# Patient Record
Sex: Male | Born: 1945 | Race: Black or African American | Hispanic: No | Marital: Married | Smoking: Current every day smoker
Health system: Southern US, Community
[De-identification: ages and names within clinical notes are randomized; demographics above are authoritative.]

## PROBLEM LIST (undated history)

## (undated) DIAGNOSIS — R7301 Impaired fasting glucose: Secondary | ICD-10-CM

## (undated) DIAGNOSIS — B1920 Unspecified viral hepatitis C without hepatic coma: Secondary | ICD-10-CM

## (undated) DIAGNOSIS — I2510 Atherosclerotic heart disease of native coronary artery without angina pectoris: Secondary | ICD-10-CM

## (undated) DIAGNOSIS — J449 Chronic obstructive pulmonary disease, unspecified: Secondary | ICD-10-CM

## (undated) DIAGNOSIS — K4090 Unilateral inguinal hernia, without obstruction or gangrene, not specified as recurrent: Principal | ICD-10-CM

## (undated) DIAGNOSIS — G4730 Sleep apnea, unspecified: Secondary | ICD-10-CM

## (undated) DIAGNOSIS — Z87891 Personal history of nicotine dependence: Principal | ICD-10-CM

## (undated) DIAGNOSIS — I10 Essential (primary) hypertension: Secondary | ICD-10-CM

## (undated) HISTORY — DX: Chronic obstructive pulmonary disease, unspecified: J44.9

## (undated) HISTORY — DX: Personal history of nicotine dependence: Z87.891

## (undated) HISTORY — DX: Unspecified viral hepatitis C without hepatic coma: B19.20

## (undated) HISTORY — DX: Sleep apnea, unspecified: G47.30

## (undated) HISTORY — DX: Atherosclerotic heart disease of native coronary artery without angina pectoris: I25.10

## (undated) HISTORY — DX: Essential (primary) hypertension: I10

## (undated) HISTORY — DX: Impaired fasting glucose: R73.01

## (undated) HISTORY — DX: Unilateral inguinal hernia, without obstruction or gangrene, not specified as recurrent: K40.90

---

## 2007-07-20 DIAGNOSIS — I10 Essential (primary) hypertension: Secondary | ICD-10-CM

## 2007-07-20 HISTORY — DX: Essential (primary) hypertension: I10

## 2007-10-09 ENCOUNTER — Ambulatory Visit: Payer: Self-pay | Admitting: Gastroenterology

## 2007-10-09 LAB — HM COLONOSCOPY

## 2008-07-19 HISTORY — PX: COLONOSCOPY: SHX174

## 2012-07-19 DIAGNOSIS — K4090 Unilateral inguinal hernia, without obstruction or gangrene, not specified as recurrent: Secondary | ICD-10-CM

## 2012-07-19 HISTORY — DX: Unilateral inguinal hernia, without obstruction or gangrene, not specified as recurrent: K40.90

## 2012-07-19 HISTORY — PX: HERNIA REPAIR: SHX51

## 2012-10-13 ENCOUNTER — Other Ambulatory Visit: Payer: Self-pay | Admitting: General Surgery

## 2012-10-13 DIAGNOSIS — K4090 Unilateral inguinal hernia, without obstruction or gangrene, not specified as recurrent: Secondary | ICD-10-CM

## 2012-10-16 ENCOUNTER — Ambulatory Visit: Payer: Self-pay | Admitting: General Surgery

## 2012-10-23 ENCOUNTER — Ambulatory Visit: Payer: Self-pay | Admitting: General Surgery

## 2012-10-23 DIAGNOSIS — K4090 Unilateral inguinal hernia, without obstruction or gangrene, not specified as recurrent: Secondary | ICD-10-CM

## 2012-10-24 ENCOUNTER — Encounter: Payer: Self-pay | Admitting: General Surgery

## 2012-10-30 ENCOUNTER — Ambulatory Visit (INDEPENDENT_AMBULATORY_CARE_PROVIDER_SITE_OTHER): Payer: BC Managed Care – PPO | Admitting: General Surgery

## 2012-10-30 ENCOUNTER — Encounter: Payer: Self-pay | Admitting: General Surgery

## 2012-10-30 VITALS — BP 140/76 | HR 72 | Resp 14 | Ht 70.5 in | Wt 213.0 lb

## 2012-10-30 DIAGNOSIS — K4090 Unilateral inguinal hernia, without obstruction or gangrene, not specified as recurrent: Secondary | ICD-10-CM

## 2012-10-30 NOTE — Patient Instructions (Addendum)
The patient can resume normal activity starting next week.

## 2012-10-30 NOTE — Progress Notes (Signed)
Patient ID: Nathan Cooper, male   DOB: 11-May-1946, 67 y.o.   MRN: [id number]  Chief Complaint  Patient presents with  . Routine Post Op    hernia repair    HPI Nathan Cooper is a 67 y.o. male who presents for a post op appointment. The procedure performed was a right inguinal hernia repair done with Progrip Mesh. The date performed was 10/23/12. The patient denies any problems at this time.   HPI  Past Medical History  Diagnosis Date  . Hypertension 2009  . Hernia, inguinal 2014    Past Surgical History  Procedure Laterality Date  . Colonoscopy  2010  . Hernia repair  2014    History reviewed. No pertinent family history.  Social History History  Substance Use Topics  . Smoking status: Current Every Day Smoker -- 1.00 packs/day for 45 years  . Smokeless tobacco: Not on file  . Alcohol Use: No    No Known Allergies  Current Outpatient Prescriptions  Medication Sig Dispense Refill  . amLODipine (NORVASC) 10 MG tablet Take 1 tablet by mouth daily.      . benazepril (LOTENSIN) 20 MG tablet Take 1 tablet by mouth daily.      Marland Kitchen oxyCODONE-acetaminophen (PERCOCET/ROXICET) 5-325 MG per tablet Take 1 tablet by mouth as needed.      Marland Kitchen VIAGRA 100 MG tablet Take 1 tablet by mouth as needed.       No current facility-administered medications for this visit.    Review of Systems Review of Systems  Constitutional: Negative.   Respiratory: Negative.   Cardiovascular: Negative.   Gastrointestinal: Negative.     Blood pressure 140/76, pulse 72, resp. rate 14, height 5' 10.5" (1.791 m), weight 213 lb (96.616 kg).  Physical Exam Physical Exam  Constitutional: He appears well-developed and well-nourished.  Eyes: Conjunctivae are normal. No scleral icterus.  Neck: Trachea normal. No mass and no thyromegaly present.  Cardiovascular: Normal pulses.   No murmur heard. Abdominal: Soft. Normal appearance and bowel sounds are normal. There is no hepatosplenomegaly. There is  no tenderness. No hernia.      Data Reviewed none  Assessment    Doinf well post op right ing hernia repair     Plan    Can start with increased activity from next week        Taleyah Hillman G 10/30/2012, 9:49 AM

## 2012-11-27 ENCOUNTER — Encounter: Payer: Self-pay | Admitting: General Surgery

## 2012-11-27 ENCOUNTER — Ambulatory Visit (INDEPENDENT_AMBULATORY_CARE_PROVIDER_SITE_OTHER): Payer: Medicare Other | Admitting: General Surgery

## 2012-11-27 VITALS — BP 142/68 | HR 76 | Resp 12 | Ht 71.0 in | Wt 213.0 lb

## 2012-11-27 DIAGNOSIS — K4090 Unilateral inguinal hernia, without obstruction or gangrene, not specified as recurrent: Secondary | ICD-10-CM

## 2012-11-27 NOTE — Progress Notes (Signed)
Patient ID: Nathan Cooper, male   DOB: 04/28/1946, 67 y.o.   MRN: [id number] Nathan Cooper is a 67 y.o. male who presents for a post op appointment. The procedure performed was a right inguinal hernia repair done with Progrip Mesh. The date performed was 10/23/12. The patient denies any problems at this time.   Right inguinal hernia repair well healed and no recurrence of hernia noted. Abdomen is sofrt, nontender, good bowel sounds.

## 2012-11-27 NOTE — Patient Instructions (Addendum)
Patient to return as needed. No activity restrictions 

## 2012-11-29 ENCOUNTER — Encounter: Payer: Self-pay | Admitting: General Surgery

## 2013-02-21 ENCOUNTER — Other Ambulatory Visit: Payer: Self-pay

## 2013-05-24 ENCOUNTER — Other Ambulatory Visit: Payer: Self-pay

## 2014-05-27 ENCOUNTER — Ambulatory Visit: Payer: Self-pay | Admitting: Family Medicine

## 2014-06-03 ENCOUNTER — Ambulatory Visit: Payer: Self-pay | Admitting: Gastroenterology

## 2014-09-06 DIAGNOSIS — G4733 Obstructive sleep apnea (adult) (pediatric): Secondary | ICD-10-CM | POA: Diagnosis not present

## 2014-11-05 DIAGNOSIS — B182 Chronic viral hepatitis C: Secondary | ICD-10-CM | POA: Diagnosis not present

## 2014-11-08 NOTE — Op Note (Signed)
PATIENT NAME:  Nathan Cooper, Nathan Cooper MR#:  [id number] DATE OF BIRTH:  July 26, 1945  DATE OF PROCEDURE:  10/23/2012  PREOPERATIVE DIAGNOSIS: Right inguinal hernia.   POSTOPERATIVE DIAGNOSIS: Direct right inguinal hernia with a large lipoma of the cord.   OPERATION: Repair, right inguinal hernia; and excision, lipoma of the cord.   SURGEON: Mckinley Jewel, M.D.   ANESTHESIA: General.   COMPLICATIONS: None.   ESTIMATED BLOOD LOSS: Less than 20 mL.   DRAINS: None.   PROCEDURE: The patient was put to sleep in the supine position on the operating table and LMA was utilized. The right groin was prepped and draped out as a sterile field. Half-percent Marcaine was instilled in the right inguinal region for postop analgesia. The deeper tissues in the course of dissection were also instilled with this, and a total of 30 mL was used all told at the end of the procedure. A skin incision was made along the medial two-thirds of the inguinal canal and deepened through the layers down to the external oblique; bleeding controlled with cautery. The patient had somewhat of a parchment-like feel to his external oblique aponeurosis, also the muscular tissue of the internal oblique fibers being somewhat thickened in nature. The external oblique was opened along the line of its fibers to expose the inguinal canal, and exploration revealed the presence of 2 things: A large lipoma of the cord, and a very large direct hernia. The lipoma was first dissected free all the way down to the internal ring area where it was clamped, cut and ligated with 2-0 Vicryl. No indirect sac was identified, with the cord structures being very thin. The medially-located direct hernia was satisfactorily freed, plicated, and the transversalis fascia was sutured over this with a running suture of 2-0 PDS. The ilioinguinal nerves were identified and preserved going along the line with the cord structures. A Parietex ProGrip mesh was brought up to the  field. The inferior edge was trimmed slightly. It was then placed around the cord and laid down around the internal ring area, smoothed out and positioned inside the external oblique laterally. The medial end was then tacked to the pubic tubercle with a 2-0 PDS stitch. Satisfactory reinforced repair was obtained. The wound was irrigated and closed. External oblique closed with a running 2-0 Vicryl. Subcutaneous tissue was closed with 2-0 Vicryl, and the skin with subcuticular 4-0 Vicryl. Dermabond was applied. The patient tolerated the procedure well. There were no immediate problems. He was extubated and returned to the recovery room in stable condition.     ____________________________ S.Robinette Haines, MD sgs:dm D: 10/23/2012 11:40:00 ET T: 10/23/2012 12:18:06 ET JOB#: [id number]  cc: S.G. Jamal Collin, MD, <Dictator> [facility] Robinette Haines MD ELECTRONICALLY SIGNED 10/23/2012 14:51

## 2014-11-29 DIAGNOSIS — G4733 Obstructive sleep apnea (adult) (pediatric): Secondary | ICD-10-CM | POA: Diagnosis not present

## 2014-12-24 ENCOUNTER — Telehealth: Payer: Self-pay | Admitting: Family Medicine

## 2014-12-24 ENCOUNTER — Telehealth: Payer: Self-pay

## 2014-12-24 MED ORDER — BENAZEPRIL HCL 40 MG PO TABS: 40.0000 mg | ORAL_TABLET | Freq: Every day | ORAL | Status: DC

## 2014-12-24 NOTE — Telephone Encounter (Signed)
Patient needs an appointment, when he makes the it she will give him enough medication. Please notify Dr.Johnson when appointment has been made.

## 2014-12-24 NOTE — Telephone Encounter (Signed)
Pt is scheduled for 12/25/14 (tomorrow) @ 8:30am. Laurie Panda by Ulla Gallo. Thanks.

## 2014-12-24 NOTE — Telephone Encounter (Signed)
Request for a refill on his BP medication. I checked [facility] he was last seen 05/16/14 for HTN.

## 2014-12-24 NOTE — Telephone Encounter (Signed)
Pt called stated [facility] is calling him about refill requests for his medications. The following med need to be refilled: Amlodopine. [facility] is [facility] [place]. Thanks.

## 2014-12-25 ENCOUNTER — Ambulatory Visit (INDEPENDENT_AMBULATORY_CARE_PROVIDER_SITE_OTHER): Payer: Medicare Other | Admitting: Family Medicine

## 2014-12-25 ENCOUNTER — Encounter: Payer: Self-pay | Admitting: Family Medicine

## 2014-12-25 VITALS — BP 138/84 | HR 89 | Temp 98.2°F | Ht 70.3 in | Wt 225.0 lb

## 2014-12-25 DIAGNOSIS — Z125 Encounter for screening for malignant neoplasm of prostate: Secondary | ICD-10-CM | POA: Diagnosis not present

## 2014-12-25 DIAGNOSIS — B182 Chronic viral hepatitis C: Secondary | ICD-10-CM

## 2014-12-25 DIAGNOSIS — I10 Essential (primary) hypertension: Secondary | ICD-10-CM | POA: Diagnosis not present

## 2014-12-25 DIAGNOSIS — Z13220 Encounter for screening for lipoid disorders: Secondary | ICD-10-CM | POA: Diagnosis not present

## 2014-12-25 DIAGNOSIS — E669 Obesity, unspecified: Secondary | ICD-10-CM

## 2014-12-25 MED ORDER — SILDENAFIL CITRATE 100 MG PO TABS: 100.0000 mg | ORAL_TABLET | ORAL | Status: DC | PRN

## 2014-12-25 MED ORDER — BENAZEPRIL HCL 40 MG PO TABS: 40.0000 mg | ORAL_TABLET | Freq: Every day | ORAL | Status: DC

## 2014-12-25 MED ORDER — AMLODIPINE BESYLATE 10 MG PO TABS: 10.0000 mg | ORAL_TABLET | Freq: Every day | ORAL | Status: DC

## 2014-12-25 NOTE — Assessment & Plan Note (Signed)
Slightly elevated today, but still under good control. Refills of his medications sent to his pharmacy. Will check back in 6 months for CPE. BMP checked today. Await results.

## 2014-12-25 NOTE — Assessment & Plan Note (Signed)
Continue to follow with GI. Will obtain records from GI to see if he has had Hep A and B vaccines, if not he will start the series here. Will get him pneumovax at his next appointment.

## 2014-12-25 NOTE — Progress Notes (Signed)
BP 138/84 mmHg  Pulse 89  Temp(Src) 98.2 F (36.8 C)  Ht 5' 10.3" (1.786 m)  Wt 225 lb (102.059 kg)  BMI 32.00 kg/m2  SpO2 97%   Subjective:    Patient ID: Nathan Cooper, male    DOB: [birth year], 69 y.o.   MRN: [id number]  HPI: Nathan Cooper is a 69 y.o. male presenting on 12/25/2014 for Hypertension He has been doing very well, has been going through [facility] treatment with GI. Feeling well. Has run out of amlodipine. Otherwise feeling well with no other concerns or complaints at this time.   HYPERTENSION Hypertension status: exacerbated has been out of the amlodipine for about a week Satisfied with current treatment? yes Duration of hypertension: chronic BP monitoring frequency:  not checking BP medication side effects:  no Medication compliance: excellent compliance Aspirin: no Recurrent headaches: no Visual changes: no Palpitations: no Dyspnea: yes- occasionally Chest pain: no Lower extremity edema: no Dizzy/lightheaded: no  HEPATITIS C Duration since diagnosis: Dx'd in November- has been seeing GI, treated with Harvoni, last check was negative viral load, to be rechecked in 1 year.  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  Relevant past medical, surgical, family and social history reviewed and updated as indicated. Interim medical history since our last visit reviewed. Allergies and medications reviewed and updated.  No current outpatient prescriptions on file prior to visit.   No current facility-administered medications on file prior to visit.    Review of Systems  Constitutional: Negative.    Respiratory: Negative.   Cardiovascular: Negative.   Gastrointestinal: Negative.   Psychiatric/Behavioral: Negative.     Per HPI unless specifically indicated above     Objective:    BP 138/84 mmHg  Pulse 89  Temp(Src) 98.2 F (36.8 C)  Ht 5' 10.3" (1.786 m)  Wt 225 lb (102.059 kg)  BMI 32.00 kg/m2  SpO2 97%  Wt Readings from Last 3 Encounters:  12/25/14 225 lb (102.059 kg)  11/27/12 213 lb (96.616 kg)  10/30/12 213 lb (96.616 kg)    Physical Exam  Constitutional: He is oriented to person, place, and time. He appears well-developed and well-nourished.  HENT:  Head: Normocephalic and atraumatic.  Eyes: Conjunctivae and EOM are normal. Pupils are equal, round, and reactive to light.  Neck: Normal range of motion. Neck supple.  Cardiovascular: Normal rate, regular rhythm, normal heart sounds and intact distal pulses.  Exam reveals no gallop and no friction rub.   No murmur heard. Pulmonary/Chest: Effort normal and breath sounds normal.  Neurological: He is alert and oriented to person, place, and time.  Skin: Skin is warm and dry.  Psychiatric: He has a normal mood and affect. His behavior is normal. Judgment and thought content normal.    No results found for this or any previous visit.    Assessment & Plan:   Problem List Items Addressed This Visit    HTN (hypertension) - Primary    Slightly elevated today, but still under good control. Refills of his medications sent to his pharmacy. Will check back in 6 months for CPE. BMP checked today. Await results.       Relevant Medications   sildenafil (VIAGRA) 100  MG tablet   amLODipine (NORVASC) 10 MG tablet   benazepril (LOTENSIN) 40 MG tablet   Other Relevant Orders   Basic Metabolic Panel (BMET)   Hep C w/o coma, chronic    Continue to follow with GI. Will obtain records from GI to see if he has had Hep A and B vaccines, if not he will start the series here. Will get him pneumovax at his next appointment.        Relevant Medications   valACYclovir (VALTREX) 500 MG tablet       Follow up plan: Return in about 6 months (around 06/26/2015) for CPE.

## 2014-12-25 NOTE — Patient Instructions (Signed)
DASH Eating Plan °DASH stands for "Dietary Approaches to Stop Hypertension." The DASH eating plan is a healthy eating plan that has been shown to reduce high blood pressure (hypertension). Additional health benefits may include reducing the risk of type 2 diabetes mellitus, heart disease, and stroke. The DASH eating plan may also help with weight loss. °WHAT DO I NEED TO KNOW ABOUT THE DASH EATING PLAN? °For the DASH eating plan, you will follow these general guidelines: °· Choose foods with a percent daily value for sodium of less than 5% (as listed on the food label). °· Use salt-free seasonings or herbs instead of table salt or sea salt. °· Check with your health care provider or pharmacist before using salt substitutes. °· Eat lower-sodium products, often labeled as "lower sodium" or "no salt added." °· Eat fresh foods. °· Eat more vegetables, fruits, and low-fat dairy products. °· Choose whole grains. Look for the word "whole" as the first word in the ingredient list. °· Choose fish and skinless chicken or turkey more often than red meat. Limit fish, poultry, and meat to 6 oz (170 g) each day. °· Limit sweets, desserts, sugars, and sugary drinks. °· Choose heart-healthy fats. °· Limit cheese to 1 oz (28 g) per day. °· Eat more home-cooked food and less restaurant, buffet, and fast food. °· Limit fried foods. °· Cook foods using methods other than frying. °· Limit canned vegetables. If you do use them, rinse them well to decrease the sodium. °· When eating at a restaurant, ask that your food be prepared with less salt, or no salt if possible. °WHAT FOODS CAN I EAT? °Seek help from a dietitian for individual calorie needs. °Grains °Whole grain or whole wheat bread. Brown rice. Whole grain or whole wheat pasta. Quinoa, bulgur, and whole grain cereals. Low-sodium cereals. Corn or whole wheat flour tortillas. Whole grain cornbread. Whole grain crackers. Low-sodium crackers. °Vegetables °Fresh or frozen vegetables  (raw, steamed, roasted, or grilled). Low-sodium or reduced-sodium tomato and vegetable juices. Low-sodium or reduced-sodium tomato sauce and paste. Low-sodium or reduced-sodium canned vegetables.  °Fruits °All fresh, canned (in natural juice), or frozen fruits. °Meat and Other Protein Products °Ground beef (85% or leaner), grass-fed beef, or beef trimmed of fat. Skinless chicken or turkey. Ground chicken or turkey. Pork trimmed of fat. All fish and seafood. Eggs. Dried beans, peas, or lentils. Unsalted nuts and seeds. Unsalted canned beans. °Dairy °Low-fat dairy products, such as skim or 1% milk, 2% or reduced-fat cheeses, low-fat ricotta or cottage cheese, or plain low-fat yogurt. Low-sodium or reduced-sodium cheeses. °Fats and Oils °Tub margarines without trans fats. Light or reduced-fat mayonnaise and salad dressings (reduced sodium). Avocado. Safflower, olive, or canola oils. Natural peanut or almond butter. °Other °Unsalted popcorn and pretzels. °The items listed above may not be a complete list of recommended foods or beverages. Contact your dietitian for more options. °WHAT FOODS ARE NOT RECOMMENDED? °Grains °White bread. White pasta. White rice. Refined cornbread. Bagels and croissants. Crackers that contain trans fat. °Vegetables °Creamed or fried vegetables. Vegetables in a cheese sauce. Regular canned vegetables. Regular canned tomato sauce and paste. Regular tomato and vegetable juices. °Fruits °Dried fruits. Canned fruit in light or heavy syrup. Fruit juice. °Meat and Other Protein Products °Fatty cuts of meat. Ribs, chicken wings, bacon, sausage, bologna, salami, chitterlings, fatback, hot dogs, bratwurst, and packaged luncheon meats. Salted nuts and seeds. Canned beans with salt. °Dairy °Whole or 2% milk, cream, half-and-half, and cream cheese. Whole-fat or sweetened yogurt. Full-fat   cheeses or blue cheese. Nondairy creamers and whipped toppings. Processed cheese, cheese spreads, or cheese  curds. °Condiments °Onion and garlic salt, seasoned salt, table salt, and sea salt. Canned and packaged gravies. Worcestershire sauce. Tartar sauce. Barbecue sauce. Teriyaki sauce. Soy sauce, including reduced sodium. Steak sauce. Fish sauce. Oyster sauce. Cocktail sauce. Horseradish. Ketchup and mustard. Meat flavorings and tenderizers. Bouillon cubes. Hot sauce. Tabasco sauce. Marinades. Taco seasonings. Relishes. °Fats and Oils °Butter, stick margarine, lard, shortening, ghee, and bacon fat. Coconut, palm kernel, or palm oils. Regular salad dressings. °Other °Pickles and olives. Salted popcorn and pretzels. °The items listed above may not be a complete list of foods and beverages to avoid. Contact your dietitian for more information. °WHERE CAN I FIND MORE INFORMATION? °National [employer], [employer], and [employer] Institute: www.nhlbi.nih.gov/health/health-topics/topics/dash/ °Document Released: 06/24/2011 Document Revised: 11/19/2013 Document Reviewed: 05/09/2013 °[employer]® Patient Information ©2015 [employer]. This information is not intended to replace advice given to you by your health care provider. Make sure you discuss any questions you have with your health care provider. ° °

## 2014-12-26 ENCOUNTER — Encounter: Payer: Self-pay | Admitting: Family Medicine

## 2014-12-26 LAB — BASIC METABOLIC PANEL
BUN / CREAT RATIO: 10 (ref 10–22)
BUN: 12 mg/dL (ref 8–27)
CALCIUM: 8.9 mg/dL (ref 8.6–10.2)
CO2: 21 mmol/L (ref 18–29)
CREATININE: 1.23 mg/dL (ref 0.76–1.27)
Chloride: 104 mmol/L (ref 97–108)
GFR calc Af Amer: 70 mL/min/{1.73_m2} (ref >59)
GFR, EST NON AFRICAN AMERICAN: 60 mL/min/{1.73_m2} (ref >59)
Glucose: 92 mg/dL (ref 65–99)
POTASSIUM: 4.1 mmol/L (ref 3.5–5.2)
Sodium: 140 mmol/L (ref 134–144)

## 2015-06-25 ENCOUNTER — Other Ambulatory Visit: Payer: Self-pay

## 2015-06-25 ENCOUNTER — Telehealth: Payer: Self-pay

## 2015-06-25 DIAGNOSIS — B182 Chronic viral hepatitis C: Secondary | ICD-10-CM

## 2015-06-25 NOTE — Telephone Encounter (Signed)
Pt notified he is due for repeat labs. Lab order mailed to pt's home.

## 2015-06-25 NOTE — Telephone Encounter (Signed)
-----   Message from Otilio Jefferson sent at 01/06/2015  2:32 PM EDT ----- Regarding: Re: Result Follow-up Sent: 05/20/2015  Phone Encounter: 11/04/2014  Pt notified of results. Call pt to remind of 52months blood work. GF  > From: Reynaldo Rossman > To: Aziza Stuckert > Sent: 11/13/2014 5:05 PM > LVM for pt to return my call. GF  > From: Lucilla Lame MD > To: Kenitra Leventhal > Sent: 11/11/2014 10:30 AM > Let pt know HCV is negative and LFT's are normal. make sure patient is tested at approproate times post treatment.

## 2015-06-30 ENCOUNTER — Encounter: Payer: Medicare Other | Admitting: Family Medicine

## 2015-07-03 DIAGNOSIS — B182 Chronic viral hepatitis C: Secondary | ICD-10-CM | POA: Diagnosis not present

## 2015-07-04 LAB — HEPATIC FUNCTION PANEL
ALBUMIN: 4.8 g/dL (ref 3.6–4.8)
ALK PHOS: 68 IU/L (ref 39–117)
ALT: 12 IU/L (ref 0–44)
AST: 17 IU/L (ref 0–40)
BILIRUBIN TOTAL: 0.3 mg/dL (ref 0.0–1.2)
BILIRUBIN, DIRECT: 0.1 mg/dL (ref 0.00–0.40)
Total Protein: 7.7 g/dL (ref 6.0–8.5)

## 2015-07-17 ENCOUNTER — Telehealth: Payer: Self-pay

## 2015-07-17 ENCOUNTER — Ambulatory Visit (INDEPENDENT_AMBULATORY_CARE_PROVIDER_SITE_OTHER): Payer: Medicare Other | Admitting: Family Medicine

## 2015-07-17 ENCOUNTER — Encounter: Payer: Self-pay | Admitting: Family Medicine

## 2015-07-17 VITALS — BP 149/70 | HR 103 | Temp 98.9°F | Ht 69.4 in | Wt 221.0 lb

## 2015-07-17 DIAGNOSIS — B182 Chronic viral hepatitis C: Secondary | ICD-10-CM

## 2015-07-17 DIAGNOSIS — S81802A Unspecified open wound, left lower leg, initial encounter: Secondary | ICD-10-CM | POA: Diagnosis not present

## 2015-07-17 DIAGNOSIS — Z125 Encounter for screening for malignant neoplasm of prostate: Secondary | ICD-10-CM

## 2015-07-17 DIAGNOSIS — Z0000 Encounter for general adult medical examination without abnormal findings: Secondary | ICD-10-CM | POA: Diagnosis not present

## 2015-07-17 DIAGNOSIS — E669 Obesity, unspecified: Secondary | ICD-10-CM | POA: Diagnosis not present

## 2015-07-17 DIAGNOSIS — R739 Hyperglycemia, unspecified: Secondary | ICD-10-CM | POA: Diagnosis not present

## 2015-07-17 DIAGNOSIS — Z13220 Encounter for screening for lipoid disorders: Secondary | ICD-10-CM

## 2015-07-17 DIAGNOSIS — I10 Essential (primary) hypertension: Secondary | ICD-10-CM | POA: Diagnosis not present

## 2015-07-17 DIAGNOSIS — Z7689 Persons encountering health services in other specified circumstances: Secondary | ICD-10-CM | POA: Diagnosis not present

## 2015-07-17 DIAGNOSIS — Z720 Tobacco use: Secondary | ICD-10-CM | POA: Diagnosis not present

## 2015-07-17 DIAGNOSIS — Z23 Encounter for immunization: Secondary | ICD-10-CM

## 2015-07-17 LAB — UA/M W/RFLX CULTURE, ROUTINE
Bilirubin, UA: NEGATIVE
Glucose, UA: NEGATIVE
LEUKOCYTES UA: NEGATIVE
Nitrite, UA: NEGATIVE
PH UA: 5.5 (ref 5.0–7.5)
PROTEIN UA: NEGATIVE
RBC, UA: NEGATIVE
Specific Gravity, UA: >1.03 — ABNORMAL HIGH (ref 1.005–1.030)
Urobilinogen, Ur: 1 mg/dL (ref 0.2–1.0)

## 2015-07-17 LAB — HCV RT-PCR, QUANT (NON-GRAPH)

## 2015-07-17 MED ORDER — VARENICLINE TARTRATE 1 MG PO TABS: 1.0000 mg | ORAL_TABLET | Freq: Two times a day (BID) | ORAL | Status: DC

## 2015-07-17 MED ORDER — VARENICLINE TARTRATE 0.5 MG X 11 & 1 MG X 42 PO MISC: ORAL | Status: DC

## 2015-07-17 MED ORDER — AMLODIPINE BESYLATE 10 MG PO TABS: 10.0000 mg | ORAL_TABLET | Freq: Every day | ORAL | Status: DC

## 2015-07-17 MED ORDER — BENAZEPRIL HCL 40 MG PO TABS: 40.0000 mg | ORAL_TABLET | Freq: Every day | ORAL | Status: DC

## 2015-07-17 NOTE — Patient Instructions (Addendum)
Preventative Services:  Health Risk Assessment and Personalized Prevention Plan: See below CVD Screening: Done Today Colon Cancer Screening: Not due until 2019 Depression Screening: Today Diabetes Screening: Done today Glaucoma Screening: Doesn't see one, will consider Hepatitis B vaccine: Given today, return in 1 month for 2nd shot Hepatitis C screening: Done  Flu Vaccine: Done today Lung cancer Screening: Ordered today Obesity Screening: Done today Pneumonia Vaccines (2): Done today STI Screening: N/A PSA screening: done today Health Maintenance, Male A healthy lifestyle and preventative care can promote health and wellness.  Maintain regular health, dental, and eye exams.  Eat a healthy diet. Foods like vegetables, fruits, whole grains, low-fat dairy products, and lean protein foods contain the nutrients you need and are low in calories. Decrease your intake of foods high in solid fats, added sugars, and salt. Get information about a proper diet from your health care provider, if necessary.  Regular physical exercise is one of the most important things you can do for your health. Most adults should get at least 150 minutes of moderate-intensity exercise (any activity that increases your heart rate and causes you to sweat) each week. In addition, most adults need muscle-strengthening exercises on 2 or more days a week.   Maintain a healthy weight. The body mass index (BMI) is a screening tool to identify possible weight problems. It provides an estimate of body fat based on height and weight. Your health care provider can find your BMI and can help you achieve or maintain a healthy weight. For males 20 years and older:  A BMI below 18.5 is considered underweight.  A BMI of 18.5 to 24.9 is normal.  A BMI of 25 to 29.9 is considered overweight.  A BMI of 30 and above is considered obese.  Maintain normal blood lipids and cholesterol by exercising and minimizing your intake of  saturated fat. Eat a balanced diet with plenty of fruits and vegetables. Blood tests for lipids and cholesterol should begin at age 6 and be repeated every 5 years. If your lipid or cholesterol levels are high, you are over age 74, or you are at high risk for heart disease, you may need your cholesterol levels checked more frequently.Ongoing high lipid and cholesterol levels should be treated with medicines if diet and exercise are not working.  If you smoke, find out from your health care provider how to quit. If you do not use tobacco, do not start.  Lung cancer screening is recommended for adults aged 46-80 years who are at high risk for developing lung cancer because of a history of smoking. A yearly low-dose CT scan of the lungs is recommended for people who have at least a 30-pack-year history of smoking and are current smokers or have quit within the past 15 years. A pack year of smoking is smoking an average of 1 pack of cigarettes a day for 1 year (for example, a 30-pack-year history of smoking could mean smoking 1 pack a day for 30 years or 2 packs a day for 15 years). Yearly screening should continue until the smoker has stopped smoking for at least 15 years. Yearly screening should be stopped for people who develop a health problem that would prevent them from having lung cancer treatment.  If you choose to drink alcohol, do not have more than 2 drinks per day. One drink is considered to be 12 oz (360 mL) of beer, 5 oz (150 mL) of wine, or 1.5 oz (45 mL) of liquor.  Avoid the use of street drugs. Do not share needles with anyone. Ask for help if you need support or instructions about stopping the use of drugs.  High blood pressure causes heart disease and increases the risk of stroke. High blood pressure is more likely to develop in:  People who have blood pressure in the end of the normal range (100-139/85-89 mm Hg).  People who are overweight or obese.  People who are African  American.  If you are 60-65 years of age, have your blood pressure checked every 3-5 years. If you are 75 years of age or older, have your blood pressure checked every year. You should have your blood pressure measured twice--once when you are at a hospital or clinic, and once when you are not at a hospital or clinic. Record the average of the two measurements. To check your blood pressure when you are not at a hospital or clinic, you can use:  An automated blood pressure machine at a pharmacy.  A home blood pressure monitor.  If you are 68-68 years old, ask your health care provider if you should take aspirin to prevent heart disease.  Diabetes screening involves taking a blood sample to check your fasting blood sugar level. This should be done once every 3 years after age 22 if you are at a normal weight and without risk factors for diabetes. Testing should be considered at a younger age or be carried out more frequently if you are overweight and have at least 1 risk factor for diabetes.  Colorectal cancer can be detected and often prevented. Most routine colorectal cancer screening begins at the age of 44 and continues through age 12. However, your health care provider may recommend screening at an earlier age if you have risk factors for colon cancer. On a yearly basis, your health care provider may provide home test kits to check for hidden blood in the stool. A small camera at the end of a tube may be used to directly examine the colon (sigmoidoscopy or colonoscopy) to detect the earliest forms of colorectal cancer. Talk to your health care provider about this at age 11 when routine screening begins. A direct exam of the colon should be repeated every 5-10 years through age 18, unless early forms of precancerous polyps or small growths are found.  People who are at an increased risk for hepatitis B should be screened for this virus. You are considered at high risk for hepatitis B if:  You were  born in a country where hepatitis B occurs often. Talk with your health care provider about which countries are considered high risk.  Your parents were born in a high-risk country and you have not received a shot to protect against hepatitis B (hepatitis B vaccine).  You have HIV or AIDS.  You use needles to inject street drugs.  You live with, or have sex with, someone who has hepatitis B.  You are a man who has sex with other men (MSM).  You get hemodialysis treatment.  You take certain medicines for conditions like cancer, organ transplantation, and autoimmune conditions.  Hepatitis C blood testing is recommended for all people born from [birth year] through 1963 and any individual with known risk factors for hepatitis C.  Healthy men should no longer receive prostate-specific antigen (PSA) blood tests as part of routine cancer screening. Talk to your health care provider about prostate cancer screening.  Testicular cancer screening is not recommended for adolescents or adult males who  have no symptoms. Screening includes self-exam, a health care provider exam, and other screening tests. Consult with your health care provider about any symptoms you have or any concerns you have about testicular cancer.  Practice safe sex. Use condoms and avoid high-risk sexual practices to reduce the spread of sexually transmitted infections (STIs).  You should be screened for STIs, including gonorrhea and chlamydia if:  You are sexually active and are younger than 24 years.  You are older than 24 years, and your health care provider tells you that you are at risk for this type of infection.  Your sexual activity has changed since you were last screened, and you are at an increased risk for chlamydia or gonorrhea. Ask your health care provider if you are at risk.  If you are at risk of being infected with HIV, it is recommended that you take a prescription medicine daily to prevent HIV infection. This is  called pre-exposure prophylaxis (PrEP). You are considered at risk if:  You are a man who has sex with other men (MSM).  You are a heterosexual man who is sexually active with multiple partners.  You take drugs by injection.  You are sexually active with a partner who has HIV.  Talk with your health care provider about whether you are at high risk of being infected with HIV. If you choose to begin PrEP, you should first be tested for HIV. You should then be tested every 3 months for as long as you are taking PrEP.  Use sunscreen. Apply sunscreen liberally and repeatedly throughout the day. You should seek shade when your shadow is shorter than you. Protect yourself by wearing long sleeves, pants, a wide-brimmed hat, and sunglasses year round whenever you are outdoors.  Tell your health care provider of new moles or changes in moles, especially if there is a change in shape or color. Also, tell your health care provider if a mole is larger than the size of a pencil eraser.  A one-time screening for abdominal aortic aneurysm (AAA) and surgical repair of large AAAs by ultrasound is recommended for men aged 27-75 years who are current or former smokers.  Stay current with your vaccines (immunizations).   This information is not intended to replace advice given to you by your health care provider. Make sure you discuss any questions you have with your health care provider.   Document Released: 01/01/2008 Document Revised: 07/26/2014 Document Reviewed: 11/30/2010 Elsevier Interactive Patient Education 2016 [place] Can Quit Smoking If you are ready to quit smoking or are thinking about it, congratulations! You have chosen to help yourself be healthier and live longer! There are lots of different ways to quit smoking. Nicotine gum, nicotine patches, a nicotine inhaler, or nicotine nasal spray can help with physical craving. Hypnosis, support groups, and medicines help break the habit of  smoking. TIPS TO GET OFF AND STAY OFF CIGARETTES  Learn to predict your moods. Do not let a bad situation be your excuse to have a cigarette. Some situations in your life might tempt you to have a cigarette.  Ask friends and co-workers not to smoke around you.  Make your home smoke-free.  Never have "just one" cigarette. It leads to wanting another and another. Remind yourself of your decision to quit.  On a card, make a list of your reasons for not smoking. Read it at least the same number of times a day as you have a cigarette. Tell yourself everyday, "I  do not want to smoke. I choose not to smoke."  Ask someone at home or work to help you with your plan to quit smoking.  Have something planned after you eat or have a cup of coffee. Take a walk or get other exercise to perk you up. This will help to keep you from overeating.  Try a relaxation exercise to calm you down and decrease your stress. Remember, you may be tense and nervous the first two weeks after you quit. This will pass.  Find new activities to keep your hands busy. Play with a pen, coin, or rubber band. Doodle or draw things on paper.  Brush your teeth right after eating. This will help cut down the craving for the taste of tobacco after meals. You can try mouthwash too.  Try gum, breath mints, or diet candy to keep something in your mouth. IF YOU SMOKE AND WANT TO QUIT:  Do not stock up on cigarettes. Never buy a carton. Wait until one pack is finished before you buy another.  Never carry cigarettes with you at work or at home.  Keep cigarettes as far away from you as possible. Leave them with someone else.  Never carry matches or a lighter with you.  Ask yourself, "Do I need this cigarette or is this just a reflex?"  Bet with someone that you can quit. Put cigarette money in a piggy bank every morning. If you smoke, you give up the money. If you do not smoke, by the end of the week, you keep the money.  Keep  trying. It takes 21 days to change a habit!  Talk to your doctor about using medicines to help you quit. These include nicotine replacement gum, lozenges, or skin patches.   This information is not intended to replace advice given to you by your health care provider. Make sure you discuss any questions you have with your health care provider.   Document Released: 05/01/2009 Document Revised: 09/27/2011 Document Reviewed: 05/01/2009 Elsevier Interactive Patient Education [employer].

## 2015-07-17 NOTE — Progress Notes (Signed)
BP 149/70 mmHg  Pulse 103  Temp(Src) 98.9 F (37.2 C)  Ht 5' 9.4" (1.763 m)  Wt 221 lb (100.245 kg)  BMI 32.25 kg/m2  SpO2 97%   Subjective:    Patient ID: Nathan Cooper, male    DOB: June 25, 1946, 69 y.o.   MRN: [id number]  HPI: Nathan Cooper is a 69 y.o. male presenting on 07/17/2015 for comprehensive medical examination. Current medical complaints include:  SMOKING CESSATION Smoking Status: cigarettes Smoking Amount: 3ppd Smoking Onset: 45 years ago Smoking Quit Date: not set Smoking triggers: stress, driving, coffee Type of tobacco use: cigarettes Children in the house: no Other household members who smoke: no Treatments attempted:  Pneumovax:    He currently lives with: wife Interim Problems from his last visit: no  Depression Screen done today and results listed below:  Depression screen [facility] 2/9 07/20/2015 12/25/2014  Decreased Interest 0 0  Down, Depressed, Hopeless 0 0  PHQ - 2 Score 0 0  Altered sleeping 0 -  Tired, decreased energy 0 -  Change in appetite 2 -  Feeling bad or failure about yourself  0 -  Trouble concentrating 0 -  Moving slowly or fidgety/restless 0 -  Suicidal thoughts 0 -  PHQ-9 Score 2 -  Difficult doing work/chores Somewhat difficult -    The patient does not have a history of falls. I did not complete a risk assessment for falls. A plan of care for falls was not documented.   Past Medical History:  Past Medical History  Diagnosis Date  . Hypertension 2009  . Hernia, inguinal 2014  . Sleep apnea   . Hepatitis C     Surgical History:  Past Surgical History  Procedure Laterality Date  . Colonoscopy  2010  . Hernia repair  2014    Medications:  Current Outpatient Prescriptions on File Prior to Visit  Medication Sig  . valACYclovir (VALTREX) 500 MG tablet Take 500 mg by mouth 2 (two) times daily.   No current facility-administered medications on file prior to visit.    Allergies:  No Known Allergies  Social  History:  Social History   Social History  . Marital Status: Married    Spouse Name: N/A  . Number of Children: N/A  . Years of Education: N/A   Occupational History  . Not on file.   Social History Main Topics  . Smoking status: Current Every Day Smoker -- 0.25 packs/day for 45 years    Types: Cigarettes, E-cigarettes  . Smokeless tobacco: Never Used  . Alcohol Use: No  . Drug Use: No  . Sexual Activity: Not on file   Other Topics Concern  . Not on file   Social History Narrative   History  Smoking status  . Current Every Day Smoker -- 0.25 packs/day for 45 years  . Types: Cigarettes, E-cigarettes  Smokeless tobacco  . Never Used   History  Alcohol Use No    Family History:  Family History  Problem Relation Age of Onset  . Hypertension Mother   . Cirrhosis Father   . Gout Son   . Heart disease Maternal Uncle 72  . Cancer Paternal Uncle   . Diabetes Paternal Grandmother     Past medical history, surgical history, medications, allergies, family history and social history reviewed with patient today and changes made to appropriate areas of the chart.   Review of Systems  Constitutional: Negative.   HENT: Negative.   Eyes: Negative.   Respiratory: Positive  for shortness of breath. Negative for cough, hemoptysis, sputum production and wheezing.   Cardiovascular: Negative.   Gastrointestinal: Negative.   Genitourinary: Negative.        Slow stream, incomplete emptying  Musculoskeletal: Negative.   Skin: Positive for rash. Negative for itching.  Neurological: Negative.   Endo/Heme/Allergies: Negative.   Psychiatric/Behavioral: Negative.     All other ROS negative except what is listed above and in the HPI.      Objective:    BP 149/70 mmHg  Pulse 103  Temp(Src) 98.9 F (37.2 C)  Ht 5' 9.4" (1.763 m)  Wt 221 lb (100.245 kg)  BMI 32.25 kg/m2  SpO2 97%  Wt Readings from Last 3 Encounters:  07/17/15 221 lb (100.245 kg)  12/25/14 225 lb (102.059  kg)  11/27/12 213 lb (96.616 kg)    Physical Exam  Constitutional: He is oriented to person, place, and time. He appears well-developed and well-nourished. No distress.  HENT:  Head: Normocephalic and atraumatic.  Right Ear: Hearing, tympanic membrane, external ear and ear canal normal.  Left Ear: Hearing, tympanic membrane, external ear and ear canal normal.  Nose: Nose normal. Right sinus exhibits no maxillary sinus tenderness and no frontal sinus tenderness. Left sinus exhibits no maxillary sinus tenderness and no frontal sinus tenderness.  Mouth/Throat: Uvula is midline, oropharynx is clear and moist and mucous membranes are normal. No oropharyngeal exudate.  Eyes: Conjunctivae, EOM and lids are normal. Pupils are equal, round, and reactive to light. Right eye exhibits no discharge. Left eye exhibits no discharge. No scleral icterus.  Neck: Normal range of motion. Neck supple. No JVD present. No tracheal deviation present. No thyromegaly present.  Cardiovascular: Normal rate, regular rhythm, normal heart sounds and intact distal pulses.  Exam reveals no gallop and no friction rub.   No murmur heard. Pulmonary/Chest: Effort normal and breath sounds normal. No stridor. No respiratory distress. He has no wheezes. He has no rales. He exhibits no tenderness.  Abdominal: Soft. Bowel sounds are normal. He exhibits no distension and no mass. There is no tenderness. There is no rebound and no guarding. Hernia confirmed negative in the right inguinal area and confirmed negative in the left inguinal area.  Genitourinary: Rectum normal, testes normal and penis normal. Prostate is enlarged. Prostate is not tender. Circumcised. No penile tenderness.  Musculoskeletal: Normal range of motion. He exhibits no edema or tenderness.  Lymphadenopathy:    He has no cervical adenopathy.       Right: No inguinal adenopathy present.       Left: No inguinal adenopathy present.  Neurological: He is alert and oriented  to person, place, and time. He has normal reflexes.  Skin: Skin is warm, dry and intact. No rash noted. He is not diaphoretic. No erythema. No pallor.  Psychiatric: He has a normal mood and affect. His speech is normal and behavior is normal. Judgment and thought content normal. Cognition and memory are normal.  Nursing note and vitals reviewed.   Results for orders placed or performed in visit on 07/17/15  Microscopic Examination  Result Value Ref Range   WBC, UA 0-5 0 -  5 /hpf   RBC, UA 0-2 0 -  2 /hpf   Epithelial Cells (non renal) None seen 0 - 10 /hpf   Casts None seen None seen /lpf   Mucus, UA Present Not Estab.   Bacteria, UA None seen None seen/Few  Lipid Panel w/o Chol/HDL Ratio  Result Value Ref Range  Cholesterol, Total 174 100 - 199 mg/dL   Triglycerides 70 0 - 149 mg/dL   HDL 46 >39 mg/dL   VLDL Cholesterol Cal 14 5 - 40 mg/dL   LDL Calculated 114 (H) 0 - 99 mg/dL  UA/M w/rflx Culture, Routine  Result Value Ref Range   Specific Gravity, UA >1.030 (H) 1.005 - 1.030   pH, UA 5.5 5.0 - 7.5   Color, UA Yellow Yellow   Appearance Ur Clear Clear   Leukocytes, UA Negative Negative   Protein, UA Negative Negative/Trace   Glucose, UA Negative Negative   Ketones, UA Trace (A) Negative   RBC, UA Negative Negative   Bilirubin, UA Negative Negative   Urobilinogen, Ur 1.0 0.2 - 1.0 mg/dL   Nitrite, UA Negative Negative  Comprehensive metabolic panel  Result Value Ref Range   Glucose 117 (H) 65 - 99 mg/dL   BUN 14 8 - 27 mg/dL   Creatinine, Ser 1.05 0.76 - 1.27 mg/dL   GFR calc non Af Amer 73 >59 mL/min/1.73   GFR calc Af Amer 84 >59 mL/min/1.73   BUN/Creatinine Ratio 13 10 - 22   Sodium 137 134 - 144 mmol/L   Potassium 4.2 3.5 - 5.2 mmol/L   Chloride 100 96 - 106 mmol/L   CO2 21 18 - 29 mmol/L   Calcium 9.2 8.6 - 10.2 mg/dL   Total Protein 7.2 6.0 - 8.5 g/dL   Albumin 4.3 3.6 - 4.8 g/dL   Globulin, Total 2.9 1.5 - 4.5 g/dL   Albumin/Globulin Ratio 1.5 1.1 - 2.5    Bilirubin Total 0.2 0.0 - 1.2 mg/dL   Alkaline Phosphatase 62 39 - 117 IU/L   AST 15 0 - 40 IU/L   ALT 11 0 - 44 IU/L  TSH  Result Value Ref Range   TSH 0.945 0.450 - 4.500 uIU/mL  CBC with Differential/Platelet  Result Value Ref Range   WBC 8.2 3.4 - 10.8 x10E3/uL   RBC 4.37 4.14 - 5.80 x10E6/uL   Hemoglobin 13.5 12.6 - 17.7 g/dL   Hematocrit 39.0 37.5 - 51.0 %   MCV 89 79 - 97 fL   MCH 30.9 26.6 - 33.0 pg   MCHC 34.6 31.5 - 35.7 g/dL   RDW 13.9 12.3 - 15.4 %   Platelets 285 150 - 379 x10E3/uL   Neutrophils 63 %   Lymphs 22 %   Monocytes 13 %   Eos 2 %   Basos 0 %   Neutrophils Absolute 5.2 1.4 - 7.0 x10E3/uL   Lymphocytes Absolute 1.8 0.7 - 3.1 x10E3/uL   Monocytes Absolute 1.1 (H) 0.1 - 0.9 x10E3/uL   EOS (ABSOLUTE) 0.1 0.0 - 0.4 x10E3/uL   Basophils Absolute 0.0 0.0 - 0.2 x10E3/uL   Immature Granulocytes 0 %   Immature Grans (Abs) 0.0 0.0 - 0.1 x10E3/uL  PSA  Result Value Ref Range   Prostate Specific Ag, Serum 0.6 0.0 - 4.0 ng/mL  UA/M w/rflx Culture, Routine  Result Value Ref Range   Specific Gravity, UA      >=1.030 (A) 1.005 - 1.030   pH, UA 5.5 5.0 - 7.5   Color, UA Yellow Yellow   Appearance Ur Clear Clear   Leukocytes, UA Negative Negative   Protein, UA Negative Negative/Trace   Glucose, UA Negative Negative   Ketones, UA Trace (A) Negative   RBC, UA Negative Negative   Bilirubin, UA Negative Negative   Urobilinogen, Ur 1.0 0.2 - 1.0 mg/dL   Nitrite, UA  Negative Negative   Microscopic Examination Comment    Microscopic Examination See below:    Urinalysis Reflex Comment   Microalbumin / creatinine urine ratio  Result Value Ref Range   Creatinine, Urine 287.6 Not Estab. mg/dL   Microalbum.,U,Random 16.1 Not Estab. ug/mL   MICROALB/CREAT RATIO 5.6 0.0 - 30.0 mg/g creat  Hgb A1c w/o eAG  Result Value Ref Range   Hgb A1c MFr Bld 6.1 (H) 4.8 - 5.6 %  Specimen status report  Result Value Ref Range   specimen status report Comment       Assessment  & Plan:   Problem List Items Addressed This Visit      Cardiovascular and Mediastinum   HTN (hypertension)   Relevant Medications   amLODipine (NORVASC) 10 MG tablet   benazepril (LOTENSIN) 40 MG tablet     Digestive   Hep C w/o coma, chronic ([place])    Other Visit Diagnoses    Medicare annual wellness visit, subsequent    -  Primary    See below for discussion of preventative services     Immunization due        Vaccines given as discussed below     Relevant Orders    Flu Vaccine QUAD 36+ mos PF IM (Fluarix & Fluzone Quad PF) (Completed)    Pneumococcal polysaccharide vaccine 23-valent greater than or equal to 2yo subcutaneous/IM (Completed)    Open wound of left lower leg, initial encounter        Td given today    Relevant Orders    Td vaccine greater than or equal to 7yo preservative free IM (Completed)    Screening for cholesterol level        Labs ordered today. Await results.     Obesity        Work on diet and exercise. Continue to monitor.     Screening for prostate cancer        Labs checked today.     Tobacco abuse        Would like to start chantix. Rx ordered. Follow up 1 month to see how he's doing.     Relevant Medications    varenicline (CHANTIX PAK) 0.5 MG X 11 & 1 MG X 42 tablet    varenicline (CHANTIX CONTINUING MONTH PAK) 1 MG tablet    Other Relevant Orders    CT CHEST LUNG CA SCREEN LOW DOSE W/O CM       Preventative Services:  Health Risk Assessment and Personalized Prevention Plan: See below CVD Screening: Done Today Colon Cancer Screening: Not due until 2019 Depression Screening: Today Diabetes Screening: Done today Glaucoma Screening: Doesn't see one, will consider Hepatitis B vaccine: Given today, return in 1 month for 2nd shot Hepatitis C screening: Done  Flu Vaccine: Done today Lung cancer Screening: Ordered today Obesity Screening: Done today Pneumonia Vaccines (2): Done today STI Screening: N/A PSA screening: done today  Discussed  aspirin prophylaxis for myocardial infarction prevention and decision was made to start ASA  LABORATORY TESTING:  Health maintenance labs ordered today as discussed above.   The natural history of prostate cancer and ongoing controversy regarding screening and potential treatment outcomes of prostate cancer has been discussed with the patient. The meaning of a false positive PSA and a false negative PSA has been discussed. He indicates understanding of the limitations of this screening test and wishes  to proceed with screening PSA testing.   IMMUNIZATIONS:   - Tdap: Tetanus vaccination  status reviewed: Td vaccination indicated and given today. - Influenza: Administered today - Pneumovax: To be given next year - Prevnar: Administered today - Zostavax vaccine: Refused  - Heb B: #1 given today - Hep A: to be given in 1 month at follow up  SCREENING: - Colonoscopy: Up to date  Discussed with patient purpose of the colonoscopy is to detect colon cancer at curable precancerous or early stages   -Hearing Test: Ordered today   PATIENT COUNSELING:    Sexuality: Discussed sexually transmitted diseases, partner selection, use of condoms, avoidance of unintended pregnancy  and contraceptive alternatives.   Advised to avoid cigarette smoking.  I discussed with the patient that most people either abstain from alcohol or drink within safe limits (<=14/week and <=4 drinks/occasion for males, <=7/weeks and <= 3 drinks/occasion for females) and that the risk for alcohol disorders and other health effects rises proportionally with the number of drinks per week and how often a drinker exceeds daily limits.  Discussed cessation/primary prevention of drug use and availability of treatment for abuse.   Diet: Encouraged to adjust caloric intake to maintain  or achieve ideal body weight, to reduce intake of dietary saturated fat and total fat, to limit sodium intake by avoiding high sodium foods and not  adding table salt, and to maintain adequate dietary potassium and calcium preferably from fresh fruits, vegetables, and low-fat dairy products.    stressed the importance of regular exercise  Injury prevention: Discussed safety belts, safety helmets, smoke detector, smoking near bedding or upholstery.   Dental health: Discussed importance of regular tooth brushing, flossing, and dental visits.   Follow up plan: NEXT PREVENTATIVE PHYSICAL DUE IN 1 YEAR. Return in about 4 weeks (around 08/14/2015) for Smoking cessation, hep A and hep B vaccines.

## 2015-07-17 NOTE — Telephone Encounter (Signed)
Pt notified of lab results. Pt will need his 1 year follow up with [facility] in April 2017. Reminder has been sent.

## 2015-07-17 NOTE — Telephone Encounter (Signed)
-----   Message from Lucilla Lame, MD sent at 07/17/2015 12:16 PM EST ----- The patient know his hepatitis C test was negative.

## 2015-07-18 LAB — CBC WITH DIFFERENTIAL/PLATELET
BASOS ABS: 0 10*3/uL (ref 0.0–0.2)
Basos: 0 %
EOS (ABSOLUTE): 0.1 10*3/uL (ref 0.0–0.4)
EOS: 2 %
HEMATOCRIT: 39 % (ref 37.5–51.0)
Hemoglobin: 13.5 g/dL (ref 12.6–17.7)
Immature Grans (Abs): 0 10*3/uL (ref 0.0–0.1)
Immature Granulocytes: 0 %
LYMPHS ABS: 1.8 10*3/uL (ref 0.7–3.1)
Lymphs: 22 %
MCH: 30.9 pg (ref 26.6–33.0)
MCHC: 34.6 g/dL (ref 31.5–35.7)
MCV: 89 fL (ref 79–97)
MONOS ABS: 1.1 10*3/uL — AB (ref 0.1–0.9)
Monocytes: 13 %
Neutrophils Absolute: 5.2 10*3/uL (ref 1.4–7.0)
Neutrophils: 63 %
Platelets: 285 10*3/uL (ref 150–379)
RBC: 4.37 x10E6/uL (ref 4.14–5.80)
RDW: 13.9 % (ref 12.3–15.4)
WBC: 8.2 10*3/uL (ref 3.4–10.8)

## 2015-07-18 LAB — COMPREHENSIVE METABOLIC PANEL
A/G RATIO: 1.5 (ref 1.1–2.5)
ALBUMIN: 4.3 g/dL (ref 3.6–4.8)
ALK PHOS: 62 IU/L (ref 39–117)
ALT: 11 IU/L (ref 0–44)
AST: 15 IU/L (ref 0–40)
BUN / CREAT RATIO: 13 (ref 10–22)
BUN: 14 mg/dL (ref 8–27)
Bilirubin Total: 0.2 mg/dL (ref 0.0–1.2)
CO2: 21 mmol/L (ref 18–29)
Calcium: 9.2 mg/dL (ref 8.6–10.2)
Chloride: 100 mmol/L (ref 96–106)
Creatinine, Ser: 1.05 mg/dL (ref 0.76–1.27)
GFR calc Af Amer: 84 mL/min/{1.73_m2} (ref >59)
GFR, EST NON AFRICAN AMERICAN: 73 mL/min/{1.73_m2} (ref >59)
GLOBULIN, TOTAL: 2.9 g/dL (ref 1.5–4.5)
Glucose: 117 mg/dL — ABNORMAL HIGH (ref 65–99)
POTASSIUM: 4.2 mmol/L (ref 3.5–5.2)
SODIUM: 137 mmol/L (ref 134–144)
Total Protein: 7.2 g/dL (ref 6.0–8.5)

## 2015-07-18 LAB — MICROSCOPIC EXAMINATION
Bacteria, UA: NONE SEEN
CASTS: NONE SEEN /LPF
EPITHELIAL CELLS (NON RENAL): NONE SEEN /HPF (ref 0–10)

## 2015-07-18 LAB — UA/M W/RFLX CULTURE, ROUTINE
Bilirubin, UA: NEGATIVE
Glucose, UA: NEGATIVE
Leukocytes, UA: NEGATIVE
Nitrite, UA: NEGATIVE
PH UA: 5.5 (ref 5.0–7.5)
PROTEIN UA: NEGATIVE
RBC, UA: NEGATIVE
Urobilinogen, Ur: 1 mg/dL (ref 0.2–1.0)

## 2015-07-18 LAB — TSH: TSH: 0.945 u[IU]/mL (ref 0.450–4.500)

## 2015-07-18 LAB — LIPID PANEL W/O CHOL/HDL RATIO
CHOLESTEROL TOTAL: 174 mg/dL (ref 100–199)
HDL: 46 mg/dL (ref >39)
LDL Calculated: 114 mg/dL — ABNORMAL HIGH (ref 0–99)
Triglycerides: 70 mg/dL (ref 0–149)
VLDL CHOLESTEROL CAL: 14 mg/dL (ref 5–40)

## 2015-07-18 LAB — PSA: Prostate Specific Ag, Serum: 0.6 ng/mL (ref 0.0–4.0)

## 2015-07-18 LAB — MICROALBUMIN / CREATININE URINE RATIO
CREATININE, UR: 287.6 mg/dL
MICROALB/CREAT RATIO: 5.6 mg/g{creat} (ref 0.0–30.0)
MICROALBUM., U, RANDOM: 16.1 ug/mL

## 2015-07-19 LAB — HGB A1C W/O EAG: HEMOGLOBIN A1C: 6.1 % — AB (ref 4.8–5.6)

## 2015-07-19 LAB — SPECIMEN STATUS REPORT

## 2015-07-20 ENCOUNTER — Encounter: Payer: Self-pay | Admitting: Family Medicine

## 2015-07-22 ENCOUNTER — Telehealth: Payer: Self-pay | Admitting: Family Medicine

## 2015-07-22 ENCOUNTER — Telehealth: Payer: Self-pay | Admitting: *Deleted

## 2015-07-22 DIAGNOSIS — R7301 Impaired fasting glucose: Secondary | ICD-10-CM | POA: Insufficient documentation

## 2015-07-22 HISTORY — DX: Impaired fasting glucose: R73.01

## 2015-07-22 NOTE — Telephone Encounter (Signed)
Received referral for low dose lung cancer screening CT scan from Dr. Wynetta Emery. Will contact patient in next couple weeks to coordinate morning appointment due to conflict with afternoon appointments. Patient is agreeable to this.

## 2015-07-22 NOTE — Telephone Encounter (Signed)
Called and gave results of his blood work- prediabetes. Dietary changes. Will need Hepatitis shots at follow up visit.

## 2015-08-14 ENCOUNTER — Encounter: Payer: Self-pay | Admitting: Family Medicine

## 2015-08-14 ENCOUNTER — Ambulatory Visit (INDEPENDENT_AMBULATORY_CARE_PROVIDER_SITE_OTHER): Payer: Medicare Other | Admitting: Family Medicine

## 2015-08-14 VITALS — BP 132/68 | HR 83 | Temp 98.7°F | Ht 69.2 in | Wt 220.0 lb

## 2015-08-14 DIAGNOSIS — I10 Essential (primary) hypertension: Secondary | ICD-10-CM | POA: Diagnosis not present

## 2015-08-14 DIAGNOSIS — Z720 Tobacco use: Secondary | ICD-10-CM | POA: Insufficient documentation

## 2015-08-14 DIAGNOSIS — Z23 Encounter for immunization: Secondary | ICD-10-CM | POA: Diagnosis not present

## 2015-08-14 MED ORDER — BENAZEPRIL HCL 40 MG PO TABS: 40.0000 mg | ORAL_TABLET | Freq: Every day | ORAL | Status: DC

## 2015-08-14 MED ORDER — AMLODIPINE BESYLATE 10 MG PO TABS: 10.0000 mg | ORAL_TABLET | Freq: Every day | ORAL | Status: DC

## 2015-08-14 MED ORDER — BETAMETHASONE DIPROPIONATE 0.05 % EX CREA: TOPICAL_CREAM | Freq: Two times a day (BID) | CUTANEOUS | Status: DC

## 2015-08-14 NOTE — Progress Notes (Signed)
BP 132/68 mmHg  Pulse 83  Temp(Src) 98.7 F (37.1 C)  Ht 5' 9.2" (1.758 m)  Wt 220 lb (99.791 kg)  BMI 32.29 kg/m2  SpO2 98%   Subjective:    Patient ID: Nathan Cooper, male    DOB: 1945/11/11, 70 y.o.   MRN: [id number]  HPI: Nathan Cooper is a 70 y.o. male  Chief Complaint  Patient presents with  . Nicotine Dependence   SMOKING CESSATION Smoking Status: not smoking since Jan 7th Smoking Amount: None Smoking Onset: 45 years ago Smoking Quit Date: 07/26/15 Smoking triggers: smelling it Type of tobacco use: cigarettes Children in the house: no Other household members who smoke: no Treatments attempted: chantix Pneumovax: Up to date  HYPERTENSION Hypertension status: stable  Satisfied with current treatment? yes Duration of hypertension: chronic BP monitoring frequency:  not checking BP medication side effects:  no Medication compliance: excellent compliance Aspirin: no Recurrent headaches: no Visual changes: no Palpitations: no Dyspnea: no Chest pain: no Lower extremity edema: no Dizzy/lightheaded: no   Relevant past medical, surgical, family and social history reviewed and updated as indicated. Interim medical history since our last visit reviewed. Allergies and medications reviewed and updated.  Review of Systems  Constitutional: Negative.   HENT: Negative.   Respiratory: Negative.   Cardiovascular: Negative.   Psychiatric/Behavioral: Negative.     Per HPI unless specifically indicated above     Objective:    BP 132/68 mmHg  Pulse 83  Temp(Src) 98.7 F (37.1 C)  Ht 5' 9.2" (1.758 m)  Wt 220 lb (99.791 kg)  BMI 32.29 kg/m2  SpO2 98%  Wt Readings from Last 3 Encounters:  08/14/15 220 lb (99.791 kg)  07/17/15 221 lb (100.245 kg)  12/25/14 225 lb (102.059 kg)    Physical Exam  Constitutional: He is oriented to person, place, and time. He appears well-developed and well-nourished. No distress.  HENT:  Head: Normocephalic and  atraumatic.  Right Ear: Hearing normal.  Left Ear: Hearing normal.  Nose: Nose normal.  Eyes: Conjunctivae and lids are normal. Right eye exhibits no discharge. Left eye exhibits no discharge. No scleral icterus.  Cardiovascular: Normal rate, regular rhythm, normal heart sounds and intact distal pulses.  Exam reveals no gallop and no friction rub.   No murmur heard. Pulmonary/Chest: Effort normal and breath sounds normal. No respiratory distress. He has no wheezes. He has no rales. He exhibits no tenderness.  Musculoskeletal: Normal range of motion.  Neurological: He is alert and oriented to person, place, and time.  Skin: Skin is warm, dry and intact. No rash noted. No erythema. No pallor.  Psychiatric: He has a normal mood and affect. His speech is normal and behavior is normal. Judgment and thought content normal. Cognition and memory are normal.  Nursing note and vitals reviewed.   Results for orders placed or performed in visit on 07/17/15  Microscopic Examination  Result Value Ref Range   WBC, UA 0-5 0 -  5 /hpf   RBC, UA 0-2 0 -  2 /hpf   Epithelial Cells (non renal) None seen 0 - 10 /hpf   Casts None seen None seen /lpf   Mucus, UA Present Not Estab.   Bacteria, UA None seen None seen/Few  Lipid Panel w/o Chol/HDL Ratio  Result Value Ref Range   Cholesterol, Total 174 100 - 199 mg/dL   Triglycerides 70 0 - 149 mg/dL   HDL 46 >39 mg/dL   VLDL Cholesterol Cal 14 5 - 40  mg/dL   LDL Calculated 114 (H) 0 - 99 mg/dL  UA/M w/rflx Culture, Routine  Result Value Ref Range   Specific Gravity, UA >1.030 (H) 1.005 - 1.030   pH, UA 5.5 5.0 - 7.5   Color, UA Yellow Yellow   Appearance Ur Clear Clear   Leukocytes, UA Negative Negative   Protein, UA Negative Negative/Trace   Glucose, UA Negative Negative   Ketones, UA Trace (A) Negative   RBC, UA Negative Negative   Bilirubin, UA Negative Negative   Urobilinogen, Ur 1.0 0.2 - 1.0 mg/dL   Nitrite, UA Negative Negative   Comprehensive metabolic panel  Result Value Ref Range   Glucose 117 (H) 65 - 99 mg/dL   BUN 14 8 - 27 mg/dL   Creatinine, Ser 1.05 0.76 - 1.27 mg/dL   GFR calc non Af Amer 73 >59 mL/min/1.73   GFR calc Af Amer 84 >59 mL/min/1.73   BUN/Creatinine Ratio 13 10 - 22   Sodium 137 134 - 144 mmol/L   Potassium 4.2 3.5 - 5.2 mmol/L   Chloride 100 96 - 106 mmol/L   CO2 21 18 - 29 mmol/L   Calcium 9.2 8.6 - 10.2 mg/dL   Total Protein 7.2 6.0 - 8.5 g/dL   Albumin 4.3 3.6 - 4.8 g/dL   Globulin, Total 2.9 1.5 - 4.5 g/dL   Albumin/Globulin Ratio 1.5 1.1 - 2.5   Bilirubin Total 0.2 0.0 - 1.2 mg/dL   Alkaline Phosphatase 62 39 - 117 IU/L   AST 15 0 - 40 IU/L   ALT 11 0 - 44 IU/L  TSH  Result Value Ref Range   TSH 0.945 0.450 - 4.500 uIU/mL  CBC with Differential/Platelet  Result Value Ref Range   WBC 8.2 3.4 - 10.8 x10E3/uL   RBC 4.37 4.14 - 5.80 x10E6/uL   Hemoglobin 13.5 12.6 - 17.7 g/dL   Hematocrit 39.0 37.5 - 51.0 %   MCV 89 79 - 97 fL   MCH 30.9 26.6 - 33.0 pg   MCHC 34.6 31.5 - 35.7 g/dL   RDW 13.9 12.3 - 15.4 %   Platelets 285 150 - 379 x10E3/uL   Neutrophils 63 %   Lymphs 22 %   Monocytes 13 %   Eos 2 %   Basos 0 %   Neutrophils Absolute 5.2 1.4 - 7.0 x10E3/uL   Lymphocytes Absolute 1.8 0.7 - 3.1 x10E3/uL   Monocytes Absolute 1.1 (H) 0.1 - 0.9 x10E3/uL   EOS (ABSOLUTE) 0.1 0.0 - 0.4 x10E3/uL   Basophils Absolute 0.0 0.0 - 0.2 x10E3/uL   Immature Granulocytes 0 %   Immature Grans (Abs) 0.0 0.0 - 0.1 x10E3/uL  PSA  Result Value Ref Range   Prostate Specific Ag, Serum 0.6 0.0 - 4.0 ng/mL  UA/M w/rflx Culture, Routine  Result Value Ref Range   Specific Gravity, UA      >=1.030 (A) 1.005 - 1.030   pH, UA 5.5 5.0 - 7.5   Color, UA Yellow Yellow   Appearance Ur Clear Clear   Leukocytes, UA Negative Negative   Protein, UA Negative Negative/Trace   Glucose, UA Negative Negative   Ketones, UA Trace (A) Negative   RBC, UA Negative Negative   Bilirubin, UA Negative  Negative   Urobilinogen, Ur 1.0 0.2 - 1.0 mg/dL   Nitrite, UA Negative Negative   Microscopic Examination Comment    Microscopic Examination See below:    Urinalysis Reflex Comment   Microalbumin / creatinine urine ratio  Result  Value Ref Range   Creatinine, Urine 287.6 Not Estab. mg/dL   Microalbum.,U,Random 16.1 Not Estab. ug/mL   MICROALB/CREAT RATIO 5.6 0.0 - 30.0 mg/g creat  Hgb A1c w/o eAG  Result Value Ref Range   Hgb A1c MFr Bld 6.1 (H) 4.8 - 5.6 %  Specimen status report  Result Value Ref Range   specimen status report Comment       Assessment & Plan:   Problem List Items Addressed This Visit      Cardiovascular and Mediastinum   HTN (hypertension)    Better on recheck. Continue current regimen. Continue to monitor. Recheck 6 months.       Relevant Medications   amLODipine (NORVASC) 10 MG tablet   benazepril (LOTENSIN) 40 MG tablet     Other   Tobacco abuse    Has not smoked in 3 weeks! Continue chantix for another 2 months, then go ahead and stop. Congratulated patient and encouraged continued abstinence from smoking.        Other Visit Diagnoses    Immunization due    -  Primary    Hep A #1 and Hep B#2 given today, due for last shots in 6 months.     Relevant Orders    Hepatitis A vaccine adult IM (Completed)    Hepatitis B vaccine adult IM (Completed)        Follow up plan: Return in about 6 months (around 02/11/2016) for HTN follow up, Hep A #2, Hep B#3.

## 2015-08-14 NOTE — Assessment & Plan Note (Signed)
Has not smoked in 3 weeks! Continue chantix for another 2 months, then go ahead and stop. Congratulated patient and encouraged continued abstinence from smoking.

## 2015-08-14 NOTE — Assessment & Plan Note (Signed)
Better on recheck. Continue current regimen. Continue to monitor. Recheck 6 months.

## 2015-08-19 ENCOUNTER — Telehealth: Payer: Self-pay | Admitting: Family Medicine

## 2015-08-19 MED ORDER — TRIAMCINOLONE ACETONIDE 0.5 % EX OINT: 1.0000 "application " | TOPICAL_OINTMENT | Freq: Two times a day (BID) | CUTANEOUS | Status: DC

## 2015-08-19 NOTE — Telephone Encounter (Signed)
Forward to provider

## 2015-08-19 NOTE — Telephone Encounter (Signed)
Rx sent to his pharmacy

## 2015-08-19 NOTE — Telephone Encounter (Signed)
The pt would like to know if there is another cream he could use that is covered under his [employer]. Diprolene is $70 with his new insurance versus $5 with his old one and he says he can't afford that. He would like it to go to [facility].

## 2015-08-26 ENCOUNTER — Encounter: Payer: Self-pay | Admitting: Family Medicine

## 2015-08-26 ENCOUNTER — Other Ambulatory Visit: Payer: Self-pay | Admitting: Family Medicine

## 2015-08-26 DIAGNOSIS — Z87891 Personal history of nicotine dependence: Secondary | ICD-10-CM

## 2015-08-26 HISTORY — DX: Personal history of nicotine dependence: Z87.891

## 2015-08-27 ENCOUNTER — Encounter: Payer: Self-pay | Admitting: Family Medicine

## 2015-08-27 ENCOUNTER — Ambulatory Visit
Admission: RE | Admit: 2015-08-27 | Discharge: 2015-08-27 | Disposition: A | Discharge status: Home / Self Care | Payer: Medicare Other | Source: Ambulatory Visit | Attending: Family Medicine | Admitting: Family Medicine

## 2015-08-27 ENCOUNTER — Inpatient Hospital Stay: Payer: Medicare Other | Attending: Family Medicine | Admitting: Family Medicine

## 2015-08-27 DIAGNOSIS — R918 Other nonspecific abnormal finding of lung field: Secondary | ICD-10-CM | POA: Insufficient documentation

## 2015-08-27 DIAGNOSIS — R911 Solitary pulmonary nodule: Secondary | ICD-10-CM | POA: Insufficient documentation

## 2015-08-27 DIAGNOSIS — Z122 Encounter for screening for malignant neoplasm of respiratory organs: Secondary | ICD-10-CM | POA: Diagnosis not present

## 2015-08-27 DIAGNOSIS — Z87891 Personal history of nicotine dependence: Secondary | ICD-10-CM

## 2015-08-27 NOTE — Progress Notes (Signed)
In accordance with CMS guidelines, patient has meet eligibility criteria including age, absence of signs or symptoms of lung cancer, the specific calculation of cigarette smoking pack-years was 45 years and is a current smoker.   A shared decision-making session was conducted prior to the performance of CT scan. This includes one or more decision aids, includes benefits and harms of screening, follow-up diagnostic testing, over-diagnosis, false positive rate, and total radiation exposure.  Counseling on the importance of adherence to annual lung cancer LDCT screening, impact of co-morbidities, and ability or willingness to undergo diagnosis and treatment is imperative for compliance of the program.  Counseling on the importance of continued smoking cessation for former smokers; the importance of smoking cessation for current smokers and information about tobacco cessation interventions have been given to patient including the [place] at [facility], [address] [place], as well as Cancer Center specific smoking cessation programs.  Written order for lung cancer screening with LDCT has been given to the patient and any and all questions have been answered to the best of my abilities.   Yearly follow up will be scheduled by Shawn Perkins, Thoracic Navigator.   

## 2015-08-29 ENCOUNTER — Telehealth: Payer: Self-pay | Admitting: *Deleted

## 2015-08-29 NOTE — Telephone Encounter (Signed)
Notified patient of LDCT lung cancer screening results of Lung Rads 2S finding with recommendation for 12 month follow up imaging. Also notified of incidental finding noted below. Patient verbalizes understanding.   IMPRESSION: 1. Lung-RADS Category 2S, benign appearance or behavior. Continue annual screening with low-dose chest CT without contrast in 12 months. 2. The "S" modifier above refers to potentially clinically significant non lung cancer related findings. Specifically, there is atherosclerosis, including left main and 2 vessel coronary artery disease. Please note that although the presence of coronary artery calcium documents the presence of coronary artery disease, the severity of this disease and any potential stenosis cannot be assessed on this non-gated CT examination. Assessment for potential risk factor modification, dietary therapy or pharmacologic therapy may be warranted, if clinically indicated. 3. Mild diffuse bronchial wall thickening with mild centrilobular and paraseptal emphysema; imaging findings suggestive of underlying COPD.

## 2015-09-01 ENCOUNTER — Telehealth: Payer: Self-pay | Admitting: Family Medicine

## 2015-09-01 ENCOUNTER — Encounter: Payer: Self-pay | Admitting: Family Medicine

## 2015-09-01 DIAGNOSIS — J449 Chronic obstructive pulmonary disease, unspecified: Secondary | ICD-10-CM | POA: Insufficient documentation

## 2015-09-01 DIAGNOSIS — I2510 Atherosclerotic heart disease of native coronary artery without angina pectoris: Secondary | ICD-10-CM

## 2015-09-01 DIAGNOSIS — I700 Atherosclerosis of aorta: Secondary | ICD-10-CM | POA: Insufficient documentation

## 2015-09-01 MED ORDER — ASPIRIN EC 81 MG PO TBEC: 81.0000 mg | DELAYED_RELEASE_TABLET | Freq: Every day | ORAL | Status: DC

## 2015-09-01 NOTE — Telephone Encounter (Signed)
CAD seen on CT of his chest as an incidental finding. Would like to send him to cardiology to make sure doesn't need stress test. Will start 81mg  ASA. Doing the right thing quitting smoking. No chest pain. Continue to monitor.

## 2015-09-04 ENCOUNTER — Ambulatory Visit (INDEPENDENT_AMBULATORY_CARE_PROVIDER_SITE_OTHER): Payer: Medicare Other | Admitting: Family Medicine

## 2015-09-04 ENCOUNTER — Encounter: Payer: Self-pay | Admitting: Family Medicine

## 2015-09-04 VITALS — BP 135/80 | HR 93 | Temp 97.6°F | Ht 69.3 in | Wt 221.0 lb

## 2015-09-04 DIAGNOSIS — Z9989 Dependence on other enabling machines and devices: Principal | ICD-10-CM

## 2015-09-04 DIAGNOSIS — G4733 Obstructive sleep apnea (adult) (pediatric): Secondary | ICD-10-CM | POA: Diagnosis not present

## 2015-09-04 NOTE — Assessment & Plan Note (Signed)
Under good control. Needs new tubing and machine. Has been stable without issues. Continue to monitor.

## 2015-09-04 NOTE — Progress Notes (Signed)
BP 135/80 mmHg  Pulse 93  Temp(Src) 97.6 F (36.4 C)  Ht 5' 9.3" (1.76 m)  Wt 221 lb (100.245 kg)  BMI 32.36 kg/m2  SpO2 99%   Subjective:    Patient ID: Nathan Cooper, male    DOB: 12-Mar-1946, 70 y.o.   MRN: [id number]  HPI: Nathan Cooper is a 70 y.o. male  Chief Complaint  Patient presents with  . Sleep Apnea    Patient needs to have his c-pap equipment replaced   SLEEP APNEA Sleep apnea status: controlled Duration: chronic Satisfied with current treatment?:  yes CPAP use:  yes Sleep quality with CPAP use: excellent Treament compliance:excellent compliance Last sleep study:  2009  Wakes feeling refreshed:  yes Daytime hypersomnolence:  no Fatigue:  no Insomnia:  no Good sleep hygiene:  yes Difficulty falling asleep:  no Difficulty staying asleep:  no Snoring bothers bed partner:  no Observed apnea by bed partner: no Obesity:  yes Hypertension: yes  Pulmonary hypertension:  no Coronary artery disease:  yes  Relevant past medical, surgical, family and social history reviewed and updated as indicated. Interim medical history since our last visit reviewed. Allergies and medications reviewed and updated.  Review of Systems  Constitutional: Negative.   Respiratory: Negative.   Cardiovascular: Negative.   Musculoskeletal: Negative.   Psychiatric/Behavioral: Negative.     Per HPI unless specifically indicated above     Objective:    BP 135/80 mmHg  Pulse 93  Temp(Src) 97.6 F (36.4 C)  Ht 5' 9.3" (1.76 m)  Wt 221 lb (100.245 kg)  BMI 32.36 kg/m2  SpO2 99%  Wt Readings from Last 3 Encounters:  09/04/15 221 lb (100.245 kg)  08/27/15 220 lb (99.791 kg)  08/14/15 220 lb (99.791 kg)    Physical Exam  Constitutional: He is oriented to person, place, and time. He appears well-developed and well-nourished. No distress.  HENT:  Head: Normocephalic and atraumatic.  Right Ear: Hearing normal.  Left Ear: Hearing normal.  Nose: Nose normal.  Eyes:  Conjunctivae and lids are normal. Right eye exhibits no discharge. Left eye exhibits no discharge. No scleral icterus.  Cardiovascular: Normal rate, regular rhythm, normal heart sounds and intact distal pulses.  Exam reveals no gallop and no friction rub.   No murmur heard. Pulmonary/Chest: Effort normal and breath sounds normal. No respiratory distress. He has no wheezes. He has no rales. He exhibits no tenderness.  Musculoskeletal: Normal range of motion.  Neurological: He is alert and oriented to person, place, and time.  Skin: Skin is warm, dry and intact. No rash noted. No erythema. No pallor.  Psychiatric: He has a normal mood and affect. His speech is normal and behavior is normal. Judgment and thought content normal. Cognition and memory are normal.  Nursing note and vitals reviewed.   Results for orders placed or performed in visit on 07/17/15  Microscopic Examination  Result Value Ref Range   WBC, UA 0-5 0 -  5 /hpf   RBC, UA 0-2 0 -  2 /hpf   Epithelial Cells (non renal) None seen 0 - 10 /hpf   Casts None seen None seen /lpf   Mucus, UA Present Not Estab.   Bacteria, UA None seen None seen/Few  Lipid Panel w/o Chol/HDL Ratio  Result Value Ref Range   Cholesterol, Total 174 100 - 199 mg/dL   Triglycerides 70 0 - 149 mg/dL   HDL 46 >39 mg/dL   VLDL Cholesterol Cal 14 5 - 40  mg/dL   LDL Calculated 114 (H) 0 - 99 mg/dL  UA/M w/rflx Culture, Routine  Result Value Ref Range   Specific Gravity, UA >1.030 (H) 1.005 - 1.030   pH, UA 5.5 5.0 - 7.5   Color, UA Yellow Yellow   Appearance Ur Clear Clear   Leukocytes, UA Negative Negative   Protein, UA Negative Negative/Trace   Glucose, UA Negative Negative   Ketones, UA Trace (A) Negative   RBC, UA Negative Negative   Bilirubin, UA Negative Negative   Urobilinogen, Ur 1.0 0.2 - 1.0 mg/dL   Nitrite, UA Negative Negative  Comprehensive metabolic panel  Result Value Ref Range   Glucose 117 (H) 65 - 99 mg/dL   BUN 14 8 - 27  mg/dL   Creatinine, Ser 1.05 0.76 - 1.27 mg/dL   GFR calc non Af Amer 73 >59 mL/min/1.73   GFR calc Af Amer 84 >59 mL/min/1.73   BUN/Creatinine Ratio 13 10 - 22   Sodium 137 134 - 144 mmol/L   Potassium 4.2 3.5 - 5.2 mmol/L   Chloride 100 96 - 106 mmol/L   CO2 21 18 - 29 mmol/L   Calcium 9.2 8.6 - 10.2 mg/dL   Total Protein 7.2 6.0 - 8.5 g/dL   Albumin 4.3 3.6 - 4.8 g/dL   Globulin, Total 2.9 1.5 - 4.5 g/dL   Albumin/Globulin Ratio 1.5 1.1 - 2.5   Bilirubin Total 0.2 0.0 - 1.2 mg/dL   Alkaline Phosphatase 62 39 - 117 IU/L   AST 15 0 - 40 IU/L   ALT 11 0 - 44 IU/L  TSH  Result Value Ref Range   TSH 0.945 0.450 - 4.500 uIU/mL  CBC with Differential/Platelet  Result Value Ref Range   WBC 8.2 3.4 - 10.8 x10E3/uL   RBC 4.37 4.14 - 5.80 x10E6/uL   Hemoglobin 13.5 12.6 - 17.7 g/dL   Hematocrit 39.0 37.5 - 51.0 %   MCV 89 79 - 97 fL   MCH 30.9 26.6 - 33.0 pg   MCHC 34.6 31.5 - 35.7 g/dL   RDW 13.9 12.3 - 15.4 %   Platelets 285 150 - 379 x10E3/uL   Neutrophils 63 %   Lymphs 22 %   Monocytes 13 %   Eos 2 %   Basos 0 %   Neutrophils Absolute 5.2 1.4 - 7.0 x10E3/uL   Lymphocytes Absolute 1.8 0.7 - 3.1 x10E3/uL   Monocytes Absolute 1.1 (H) 0.1 - 0.9 x10E3/uL   EOS (ABSOLUTE) 0.1 0.0 - 0.4 x10E3/uL   Basophils Absolute 0.0 0.0 - 0.2 x10E3/uL   Immature Granulocytes 0 %   Immature Grans (Abs) 0.0 0.0 - 0.1 x10E3/uL  PSA  Result Value Ref Range   Prostate Specific Ag, Serum 0.6 0.0 - 4.0 ng/mL  UA/M w/rflx Culture, Routine  Result Value Ref Range   Specific Gravity, UA      >=1.030 (A) 1.005 - 1.030   pH, UA 5.5 5.0 - 7.5   Color, UA Yellow Yellow   Appearance Ur Clear Clear   Leukocytes, UA Negative Negative   Protein, UA Negative Negative/Trace   Glucose, UA Negative Negative   Ketones, UA Trace (A) Negative   RBC, UA Negative Negative   Bilirubin, UA Negative Negative   Urobilinogen, Ur 1.0 0.2 - 1.0 mg/dL   Nitrite, UA Negative Negative   Microscopic Examination  Comment    Microscopic Examination See below:    Urinalysis Reflex Comment   Microalbumin / creatinine urine ratio  Result  Value Ref Range   Creatinine, Urine 287.6 Not Estab. mg/dL   Microalbum.,U,Random 16.1 Not Estab. ug/mL   MICROALB/CREAT RATIO 5.6 0.0 - 30.0 mg/g creat  Hgb A1c w/o eAG  Result Value Ref Range   Hgb A1c MFr Bld 6.1 (H) 4.8 - 5.6 %  Specimen status report  Result Value Ref Range   specimen status report Comment       Assessment & Plan:   Problem List Items Addressed This Visit      Respiratory   OSA on CPAP - Primary    Under good control. Needs new tubing and machine. Has been stable without issues. Continue to monitor.           Follow up plan: Return As scheduled.

## 2015-09-11 ENCOUNTER — Encounter: Payer: Self-pay | Admitting: *Deleted

## 2015-12-12 DIAGNOSIS — G4733 Obstructive sleep apnea (adult) (pediatric): Secondary | ICD-10-CM | POA: Diagnosis not present

## 2016-02-06 ENCOUNTER — Other Ambulatory Visit: Payer: Self-pay

## 2016-02-11 ENCOUNTER — Ambulatory Visit (INDEPENDENT_AMBULATORY_CARE_PROVIDER_SITE_OTHER): Payer: Medicare Other | Admitting: Family Medicine

## 2016-02-11 ENCOUNTER — Encounter: Payer: Self-pay | Admitting: Family Medicine

## 2016-02-11 VITALS — BP 127/69 | HR 77 | Temp 98.4°F | Ht 69.8 in | Wt 221.0 lb

## 2016-02-11 DIAGNOSIS — Z23 Encounter for immunization: Secondary | ICD-10-CM

## 2016-02-11 DIAGNOSIS — J449 Chronic obstructive pulmonary disease, unspecified: Secondary | ICD-10-CM

## 2016-02-11 DIAGNOSIS — I10 Essential (primary) hypertension: Secondary | ICD-10-CM

## 2016-02-11 DIAGNOSIS — Z87891 Personal history of nicotine dependence: Secondary | ICD-10-CM

## 2016-02-11 DIAGNOSIS — R7301 Impaired fasting glucose: Secondary | ICD-10-CM

## 2016-02-11 DIAGNOSIS — N529 Male erectile dysfunction, unspecified: Secondary | ICD-10-CM | POA: Diagnosis not present

## 2016-02-11 LAB — BAYER DCA HB A1C WAIVED: HB A1C: 5.7 % (ref <7.0)

## 2016-02-11 MED ORDER — AMLODIPINE BESYLATE 10 MG PO TABS: 10.0000 mg | ORAL_TABLET | Freq: Every day | ORAL | 1 refills | Status: DC

## 2016-02-11 MED ORDER — VARENICLINE TARTRATE 0.5 MG X 11 & 1 MG X 42 PO MISC: ORAL | 0 refills | Status: DC

## 2016-02-11 MED ORDER — SILDENAFIL CITRATE 20 MG PO TABS: ORAL_TABLET | ORAL | 12 refills | Status: DC

## 2016-02-11 MED ORDER — BENAZEPRIL HCL 40 MG PO TABS: 40.0000 mg | ORAL_TABLET | Freq: Every day | ORAL | 1 refills | Status: DC

## 2016-02-11 MED ORDER — VARENICLINE TARTRATE 1 MG PO TABS: 1.0000 mg | ORAL_TABLET | Freq: Two times a day (BID) | ORAL | 3 refills | Status: DC

## 2016-02-11 NOTE — Assessment & Plan Note (Signed)
Under good control. Continue current regimen. Continue to monitor. Call with any concerns. 

## 2016-02-11 NOTE — Assessment & Plan Note (Signed)
Will start viagra. Rx sent to his pharmacy.

## 2016-02-11 NOTE — Assessment & Plan Note (Signed)
Stable. Does not want any inhalers. Call with any concerns.

## 2016-02-11 NOTE — Assessment & Plan Note (Signed)
Smoking again. Would like to try chantix again. Rx sent to his pharmacy.

## 2016-02-11 NOTE — Assessment & Plan Note (Signed)
Improved to 5.7. Continue diet and exercise. Continue to monitor. Call with any concerns.

## 2016-02-11 NOTE — Progress Notes (Signed)
BP 127/69 (BP Location: Left Arm, Patient Position: Sitting, Cuff Size: Large)   Pulse 77   Temp 98.4 F (36.9 C)   Ht 5' 9.8" (1.773 m)   Wt 221 lb (100.2 kg)   SpO2 98%   BMI 31.89 kg/m    Subjective:    Patient ID: Nathan Cooper, male    DOB: [birth year], 70 y.o.   MRN: [id number]  HPI: Nathan Cooper is a 70 y.o. male  Chief Complaint  Patient presents with  . Hypertension  . Other    IFG  . COPD  . Nicotine Dependence    patient would like a refill on Chantix   Impaired Fasting Glucose HbA1C: 5.7 Lab Results  Component Value Date   HGBA1C 6.1 (H) 07/17/2015   Duration of elevated blood sugar: chronic Polydipsia: no Polyuria: no Weight change: no Visual disturbance: no Glucose Monitoring: no Diabetic Education: Not Completed Family history of diabetes: yes  HYPERTENSION Hypertension status: controlled  Satisfied with current treatment? yes Duration of hypertension: chronic BP monitoring frequency:  not checking BP medication side effects:  no Medication compliance: excellent compliance Aspirin: yes Recurrent headaches: no Visual changes: no Palpitations: no Dyspnea: no Chest pain: no Lower extremity edema: no Dizzy/lightheaded: no  COPD COPD status: controlled Satisfied with current treatment?: yes Oxygen use: no Dyspnea frequency: rarely Cough frequency: rarely Rescue inhaler frequency: never- doesn't want one   Limitation of activity: no Productive cough: No Pneumovax: Up to Date Influenza: Up to Date  .Stayed off the cigarettes for about 3-4 weeks, but then started smoking again. Would like a refill on his chantix. It worked well for him previously.  Relevant past medical, surgical, family and social history reviewed and updated as indicated. Interim medical history since our last visit reviewed. Allergies and medications reviewed and updated.  Review of Systems  Constitutional: Negative.   Respiratory: Negative.     Cardiovascular: Negative.   Psychiatric/Behavioral: Negative.     Per HPI unless specifically indicated above     Objective:    BP 127/69 (BP Location: Left Arm, Patient Position: Sitting, Cuff Size: Large)   Pulse 77   Temp 98.4 F (36.9 C)   Ht 5' 9.8" (1.773 m)   Wt 221 lb (100.2 kg)   SpO2 98%   BMI 31.89 kg/m   Wt Readings from Last 3 Encounters:  02/11/16 221 lb (100.2 kg)  09/04/15 221 lb (100.2 kg)  08/27/15 220 lb (99.8 kg)    Physical Exam  Constitutional: He is oriented to person, place, and time. He appears well-developed and well-nourished. No distress.  HENT:  Head: Normocephalic and atraumatic.  Right Ear: Hearing normal.  Left Ear: Hearing normal.  Nose: Nose normal.  Eyes: Conjunctivae and lids are normal. Right eye exhibits no discharge. Left eye exhibits no discharge. No scleral icterus.  Cardiovascular: Normal rate, regular rhythm, normal heart sounds and intact distal pulses.  Exam reveals no gallop and no friction rub.   No murmur heard. Pulmonary/Chest: Effort normal and breath sounds normal. No respiratory distress. He has no wheezes. He has no rales. He exhibits no tenderness.  Musculoskeletal: Normal range of motion.  Neurological: He is alert and oriented to person, place, and time.  Skin: Skin is warm, dry and intact. No rash noted. He is not diaphoretic. No erythema. No pallor.  Psychiatric: He has a normal mood and affect. His speech is normal and behavior is normal. Judgment and thought content normal. Cognition and memory are  normal.  Nursing note and vitals reviewed.   Results for orders placed or performed in visit on 07/17/15  Microscopic Examination  Result Value Ref Range   WBC, UA 0-5 0 - 5 /hpf   RBC, UA 0-2 0 - 2 /hpf   Epithelial Cells (non renal) None seen 0 - 10 /hpf   Casts None seen None seen /lpf   Mucus, UA Present Not Estab.   Bacteria, UA None seen None seen/Few  Lipid Panel w/o Chol/HDL Ratio  Result Value Ref  Range   Cholesterol, Total 174 100 - 199 mg/dL   Triglycerides 70 0 - 149 mg/dL   HDL 46 >39 mg/dL   VLDL Cholesterol Cal 14 5 - 40 mg/dL   LDL Calculated 114 (H) 0 - 99 mg/dL  UA/M w/rflx Culture, Routine  Result Value Ref Range   Specific Gravity, UA >1.030 (H) 1.005 - 1.030   pH, UA 5.5 5.0 - 7.5   Color, UA Yellow Yellow   Appearance Ur Clear Clear   Leukocytes, UA Negative Negative   Protein, UA Negative Negative/Trace   Glucose, UA Negative Negative   Ketones, UA Trace (A) Negative   RBC, UA Negative Negative   Bilirubin, UA Negative Negative   Urobilinogen, Ur 1.0 0.2 - 1.0 mg/dL   Nitrite, UA Negative Negative  Comprehensive metabolic panel  Result Value Ref Range   Glucose 117 (H) 65 - 99 mg/dL   BUN 14 8 - 27 mg/dL   Creatinine, Ser 1.05 0.76 - 1.27 mg/dL   GFR calc non Af Amer 73 >59 mL/min/1.73   GFR calc Af Amer 84 >59 mL/min/1.73   BUN/Creatinine Ratio 13 10 - 22   Sodium 137 134 - 144 mmol/L   Potassium 4.2 3.5 - 5.2 mmol/L   Chloride 100 96 - 106 mmol/L   CO2 21 18 - 29 mmol/L   Calcium 9.2 8.6 - 10.2 mg/dL   Total Protein 7.2 6.0 - 8.5 g/dL   Albumin 4.3 3.6 - 4.8 g/dL   Globulin, Total 2.9 1.5 - 4.5 g/dL   Albumin/Globulin Ratio 1.5 1.1 - 2.5   Bilirubin Total 0.2 0.0 - 1.2 mg/dL   Alkaline Phosphatase 62 39 - 117 IU/L   AST 15 0 - 40 IU/L   ALT 11 0 - 44 IU/L  TSH  Result Value Ref Range   TSH 0.945 0.450 - 4.500 uIU/mL  CBC with Differential/Platelet  Result Value Ref Range   WBC 8.2 3.4 - 10.8 x10E3/uL   RBC 4.37 4.14 - 5.80 x10E6/uL   Hemoglobin 13.5 12.6 - 17.7 g/dL   Hematocrit 39.0 37.5 - 51.0 %   MCV 89 79 - 97 fL   MCH 30.9 26.6 - 33.0 pg   MCHC 34.6 31.5 - 35.7 g/dL   RDW 13.9 12.3 - 15.4 %   Platelets 285 150 - 379 x10E3/uL   Neutrophils 63 %   Lymphs 22 %   Monocytes 13 %   Eos 2 %   Basos 0 %   Neutrophils Absolute 5.2 1.4 - 7.0 x10E3/uL   Lymphocytes Absolute 1.8 0.7 - 3.1 x10E3/uL   Monocytes Absolute 1.1 (H) 0.1 - 0.9  x10E3/uL   EOS (ABSOLUTE) 0.1 0.0 - 0.4 x10E3/uL   Basophils Absolute 0.0 0.0 - 0.2 x10E3/uL   Immature Granulocytes 0 %   Immature Grans (Abs) 0.0 0.0 - 0.1 x10E3/uL  PSA  Result Value Ref Range   Prostate Specific Ag, Serum 0.6 0.0 - 4.0 ng/mL  UA/M w/rflx Culture, Routine  Result Value Ref Range   Specific Gravity, UA      >=1.030 (A) 1.005 - 1.030   pH, UA 5.5 5.0 - 7.5   Color, UA Yellow Yellow   Appearance Ur Clear Clear   Leukocytes, UA Negative Negative   Protein, UA Negative Negative/Trace   Glucose, UA Negative Negative   Ketones, UA Trace (A) Negative   RBC, UA Negative Negative   Bilirubin, UA Negative Negative   Urobilinogen, Ur 1.0 0.2 - 1.0 mg/dL   Nitrite, UA Negative Negative   Microscopic Examination Comment    Microscopic Examination See below:    Urinalysis Reflex Comment   Microalbumin / creatinine urine ratio  Result Value Ref Range   Creatinine, Urine 287.6 Not Estab. mg/dL   Microalbum.,U,Random 16.1 Not Estab. ug/mL   MICROALB/CREAT RATIO 5.6 0.0 - 30.0 mg/g creat  Hgb A1c w/o eAG  Result Value Ref Range   Hgb A1c MFr Bld 6.1 (H) 4.8 - 5.6 %  Specimen status report  Result Value Ref Range   specimen status report Comment       Assessment & Plan:   Problem List Items Addressed This Visit      Cardiovascular and Mediastinum   HTN (hypertension) - Primary    Under good control. Continue current regimen. Continue to monitor. Call with any concerns.       Relevant Medications   amLODipine (NORVASC) 10 MG tablet   benazepril (LOTENSIN) 40 MG tablet   sildenafil (REVATIO) 20 MG tablet   Other Relevant Orders   Comprehensive metabolic panel     Respiratory   COPD (chronic obstructive pulmonary disease) (HCC)    Stable. Does not want any inhalers. Call with any concerns.       Relevant Medications   varenicline (CHANTIX PAK) 0.5 MG X 11 & 1 MG X 42 tablet   varenicline (CHANTIX CONTINUING MONTH PAK) 1 MG tablet     Endocrine   IFG  (impaired fasting glucose)    Improved to 5.7. Continue diet and exercise. Continue to monitor. Call with any concerns.       Relevant Orders   Bayer DCA Hb A1c Waived   Comprehensive metabolic panel     Genitourinary   ED (erectile dysfunction)    Will start viagra. Rx sent to his pharmacy.      Relevant Medications   sildenafil (REVATIO) 20 MG tablet     Other   Personal history of tobacco use, presenting hazards to health    Smoking again. Would like to try chantix again. Rx sent to his pharmacy.      Relevant Medications   varenicline (CHANTIX PAK) 0.5 MG X 11 & 1 MG X 42 tablet   varenicline (CHANTIX CONTINUING MONTH PAK) 1 MG tablet    Other Visit Diagnoses    Immunization due       Hep A and B given today. Now up to date on the series.    Relevant Orders   Hepatitis A vaccine adult IM (Completed)   Hepatitis B vaccine adult IM (Completed)       Follow up plan: Return in about 6 months (around 08/13/2016) for wellness.

## 2016-02-11 NOTE — Patient Instructions (Addendum)
Hepatitis A Vaccine: What You Need to Know 1. Why get vaccinated? Hepatitis A is a serious liver disease. It is caused by the hepatitis A virus (HAV). HAV is spread from person to person through contact with the feces (stool) of people who are infected, which can easily happen if someone does not wash his or her hands properly. You can also get hepatitis A from food, water, or objects contaminated with HAV. Symptoms of hepatitis A can include:  fever, fatigue, loss of appetite, nausea, vomiting, and/or joint pain  severe stomach pains and diarrhea (mainly in children), or  jaundice (yellow skin or eyes, dark urine, clay-colored bowel movements). These symptoms usually appear 2 to 6 weeks after exposure and usually last less than 2 months, although some people can be ill for as long as 6 months. If you have hepatitis A you may be too ill to work. Children often do not have symptoms, but most adults do. You can spread HAV without having symptoms. Hepatitis A can cause liver failure and death, although this is rare and occurs more commonly in persons 5 years of age or older and persons with other liver diseases, such as hepatitis B or C. Hepatitis A vaccine can prevent hepatitis A. Hepatitis A vaccines were recommended in the [place] beginning in 1996. Since then, the number of cases reported each year in the [place] has dropped from around 31,000 cases to fewer than 1,500 cases. 2. Hepatitis A vaccine Hepatitis A vaccine is an inactivated (killed) vaccine. You will need 2 doses for long-lasting protection. These doses should be given at least 6 months apart. Children are routinely vaccinated between their first and second birthdays (19 through 28 months of age). Older children and adolescents can get the vaccine after 23 months. Adults who have not been vaccinated previously and want to be protected against hepatitis A can also get the vaccine. You should get hepatitis A vaccine if you:  are  traveling to countries where hepatitis A is common,  are a man who has sex with other men,  use illegal drugs,  have a chronic liver disease such as hepatitis B or hepatitis C,  are being treated with clotting-factor concentrates,  work with hepatitis A-infected animals or in a hepatitis A research laboratory, or  expect to have close personal contact with an international adoptee from a country where hepatitis A is common Ask your healthcare provider if you want more information about any of these groups. There are no known risks to getting hepatitis A vaccine at the same time as other vaccines. 3. Some people should not get this vaccine Tell the person who is giving you the vaccine:  If you have any severe, life-threatening allergies. If you ever had a life-threatening allergic reaction after a dose of hepatitis A vaccine, or have a severe allergy to any part of this vaccine, you may be advised not to get vaccinated. Ask your health care provider if you want information about vaccine components.  If you are not feeling well. If you have a mild illness, such as a cold, you can probably get the vaccine today. If you are moderately or severely ill, you should probably wait until you recover. Your doctor can advise you. 4. Risks of a vaccine reaction With any medicine, including vaccines, there is a chance of side effects. These are usually mild and go away on their own, but serious reactions are also possible. Most people who get hepatitis A vaccine do not have  any problems with it. Minor problems following hepatitis A vaccine include:  soreness or redness where the shot was given  low-grade fever  headache  tiredness If these problems occur, they usually begin soon after the shot and last 1 or 2 days. Your doctor can tell you more about these reactions. Other problems that could happen after this vaccine:  People sometimes faint after a medical procedure, including vaccination.  Sitting or lying down for about 15 minutes can help prevent fainting, and injuries caused by a fall. Tell your provider if you feel dizzy, or have vision changes or ringing in the ears.  Some people get shoulder pain that can be more severe and longer lasting than the more routine soreness that can follow injections. This happens very rarely.  Any medication can cause a severe allergic reaction. Such reactions from a vaccine are very rare, estimated at about 1 in a million doses, and would happen within a few minutes to a few hours after the vaccination. As with any medicine, there is a very remote chance of a vaccine causing a serious injury or death. The safety of vaccines is always being monitored. For more information, visit: http://www.aguilar.org/ 5. What if there is a serious problem? What should I look for?  Look for anything that concerns you, such as signs of a severe allergic reaction, very high fever, or unusual behavior. Signs of a severe allergic reaction can include hives, swelling of the face and throat, difficulty breathing, a fast heartbeat, dizziness, and weakness. These would start a few minutes to a few hours after the vaccination. What should I do?  If you think it is a severe allergic reaction or other emergency that can't wait, call 9-1-1 or get to the nearest hospital. Otherwise, call your clinic. Afterward, the reaction should be reported to the Vaccine Adverse Event Reporting System (VAERS). Your doctor should file this report, or you can do it yourself through the VAERS web site at www.vaers.SamedayNews.es, or by calling 419-187-2076. VAERS does not give medical advice. 6. The National Vaccine Injury Compensation Program The [employer] Vaccine Injury Compensation Program (VICP) is a federal program that was created to compensate people who may have been injured by certain vaccines. Persons who believe they may have been injured by a vaccine can learn about the program and  about filing a claim by calling 703-427-3609 or visiting the [place] website at GoldCloset.com.ee. There is a time limit to file a claim for compensation. 7. How can I learn more?  Ask your healthcare provider. He or she can give you the vaccine package insert or suggest other sources of information.  Call your local or state health department.  Contact the Centers for Disease Control and Prevention (CDC):  Call (731)120-5928 (1-800-CDC-INFO) or  Visit CDC's website at http://hunter.com/ CDC Hepatitis A Vaccine VIS (02/05/2015)   This information is not intended to replace advice given to you by your health care provider. Make sure you discuss any questions you have with your health care provider.   Document Released: 04/29/2006 Document Revised: 03/26/2015 Document Reviewed: 02/22/2015 Elsevier Interactive Patient Education 2016 Elsevier Inc. Hepatitis B Vaccine: What You Need to Know 1. Why get vaccinated? Hepatitis B is a serious disease that affects the liver. It is caused by the hepatitis B virus. Hepatitis B can cause mild illness lasting a few weeks, or it can lead to a serious, lifelong illness. Hepatitis B virus infection can be either acute or chronic. Acute hepatitis B virus infection is  a short-term illness that occurs within the first 6 months after someone is exposed to the hepatitis B virus. This can lead to:  fever, fatigue, loss of appetite, nausea, and/or vomiting  jaundice (yellow skin or eyes, dark urine, clay-colored bowel movements)  pain in muscles, joints, and stomach Chronic hepatitis B virus infection is a long-term illness that occurs when the hepatitis B virus remains in a person's body. Most people who go on to develop chronic hepatitis B do not have symptoms, but it is still very serious and can lead to:  liver damage (cirrhosis)  liver cancer  death Chronically-infected people can spread hepatitis B virus to others, even if they do  not feel or look sick themselves. Up to 1.4 million people in the [place] may have chronic hepatitis B infection. About 90% of infants who get hepatitis B become chronically infected and about 1 out of 4 of them dies. Hepatitis B is spread when blood, semen, or other body fluid infected with the Hepatitis B virus enters the body of a person who is not infected. People can become infected with the virus through:  Birth (a baby whose mother is infected can be infected at or after birth)  Sharing items such as razors or toothbrushes with an infected person  Contact with the blood or open sores of an infected person  Sex with an infected partner  Sharing needles, syringes, or other drug-injection equipment  Exposure to blood from needlesticks or other sharp instruments Each year about 2,000 people in the [place] die from hepatitis B-related liver disease. Hepatitis B vaccine can prevent hepatitis B and its consequences, including liver cancer and cirrhosis. 2. Hepatitis B vaccine Hepatitis B vaccine is made from parts of the hepatitis B virus. It cannot cause hepatitis B infection. The vaccine is usually given as 3 or 4 shots over a 39-month period. Infants should get their first dose of hepatitis B vaccine at birth and will usually complete the series at 33 months of age. All children and adolescents younger than 44 years of age who have not yet gotten the vaccine should also be vaccinated. Hepatitis B vaccine is recommended for unvaccinated adults who are at risk for hepatitis B virus infection, including:  People whose sex partners have hepatitis B  Sexually active persons who are not in a long-term monogamous relationship  Persons seeking evaluation or treatment for a sexually transmitted disease  Men who have sexual contact with other men  People who share needles, syringes, or other drug-injection equipment  People who have household contact with someone infected with the  hepatitis B virus  Health care and public safety workers at risk for exposure to blood or body fluids  Residents and staff of facilities for developmentally disabled persons  Persons in correctional facilities  Victims of sexual assault or abuse  Travelers to regions with increased rates of hepatitis B  People with chronic liver disease, kidney disease, HIV infection, or diabetes  Anyone who wants to be protected from hepatitis B There are no known risks to getting hepatitis B vaccine at the same time as other vaccines. 3. Some people should not get this vaccine Tell the person who is giving the vaccine:  If the person getting the vaccine has any severe, life-threatening allergies. If you ever had a life-threatening allergic reaction after a dose of hepatitis B vaccine, or have a severe allergy to any part of this vaccine, you may be advised not to get vaccinated. Ask  your health care provider if you want information about vaccine components.  If the person getting the vaccine is not feeling well. If you have a mild illness, such as a cold, you can probably get the vaccine today. If you are moderately or severely ill, you should probably wait until you recover. Your doctor can advise you. 4. Risks of a vaccine reaction With any medicine, including vaccines, there is a chance of side effects. These are usually mild and go away on their own, but serious reactions are also possible. Most people who get hepatitis B vaccine do not have any problems with it. Minor problems following hepatitis B vaccine include:  soreness where the shot was given  temperature of 99.40F or higher If these problems occur, they usually begin soon after the shot and last 1 or 2 days. Your doctor can tell you more about these reactions. Other problems that could happen after this vaccine:  People sometimes faint after a medical procedure, including vaccination. Sitting or lying down for about 15 minutes can  help prevent fainting and injuries caused by a fall. Tell your provider if you feel dizzy, or have vision changes or ringing in the ears.  Some people get shoulder pain that can be more severe and longer-lasting than the more routine soreness that can follow injections. This happens very rarely.  Any medication can cause a severe allergic reaction. Such reactions from a vaccine are very rare, estimated at about 1 in a million doses, and would happen within a few minutes to a few hours after the vaccination. As with any medicine, there is a very remote chance of a vaccine causing a serious injury or death. The safety of vaccines is always being monitored. For more information, visit: http://www.aguilar.org/ 5. What if there is a serious problem? What should I look for?  Look for anything that concerns you, such as signs of a severe allergic reaction, very high fever, or unusual behavior. Signs of a severe allergic reaction can include hives, swelling of the face and throat, difficulty breathing, a fast heartbeat, dizziness, and weakness. These would start a few minutes to a few hours after the vaccination. What should I do?  If you think it is a severe allergic reaction or other emergency that can't wait, call 9-1-1 or get to the nearest hospital. Otherwise, call your clinic. Afterward, the reaction should be reported to the Vaccine Adverse Event Reporting System (VAERS). Your doctor should file this report, or you can do it yourself through the VAERS web site at www.vaers.SamedayNews.es, or by calling 7150051008. VAERS does not give medical advice. 6. The National Vaccine Injury Compensation Program The [employer] Vaccine Injury Compensation Program (VICP) is a federal program that was created to compensate people who may have been injured by certain vaccines. Persons who believe they may have been injured by a vaccine can learn about the program and about filing a claim by calling 443-629-2430 or  visiting the [place] website at GoldCloset.com.ee. There is a time limit to file a claim for compensation. 7. How can I learn more?  Ask your healthcare provider. He or she can give you the vaccine package insert or suggest other sources of information.  Call your local or state health department.  Contact the Centers for Disease Control and Prevention (CDC):  Call 3133084846 (1-800-CDC-INFO) or  Visit CDC's website at http://hunter.com/ CDC Hepatitis B VIS (02/05/2015)   This information is not intended to replace advice given to you by your health care  provider. Make sure you discuss any questions you have with your health care provider.   Document Released: 04/29/2006 Document Revised: 03/26/2015 Document Reviewed: 02/22/2015 Elsevier Interactive Patient Education [employer].

## 2016-02-12 LAB — COMPREHENSIVE METABOLIC PANEL
ALK PHOS: 69 IU/L (ref 39–117)
ALT: 10 IU/L (ref 0–44)
AST: 14 IU/L (ref 0–40)
Albumin/Globulin Ratio: 1.4 (ref 1.2–2.2)
Albumin: 4.4 g/dL (ref 3.6–4.8)
BUN/Creatinine Ratio: 12 (ref 10–24)
BUN: 13 mg/dL (ref 8–27)
Bilirubin Total: 0.3 mg/dL (ref 0.0–1.2)
CALCIUM: 9.1 mg/dL (ref 8.6–10.2)
CO2: 17 mmol/L — AB (ref 18–29)
CREATININE: 1.06 mg/dL (ref 0.76–1.27)
Chloride: 99 mmol/L (ref 96–106)
GFR calc Af Amer: 83 mL/min/{1.73_m2} (ref >59)
GFR calc non Af Amer: 72 mL/min/{1.73_m2} (ref >59)
GLOBULIN, TOTAL: 3.1 g/dL (ref 1.5–4.5)
Glucose: 114 mg/dL — ABNORMAL HIGH (ref 65–99)
Potassium: 4 mmol/L (ref 3.5–5.2)
Sodium: 136 mmol/L (ref 134–144)
Total Protein: 7.5 g/dL (ref 6.0–8.5)

## 2016-03-05 ENCOUNTER — Other Ambulatory Visit: Payer: Self-pay

## 2016-03-05 DIAGNOSIS — B182 Chronic viral hepatitis C: Secondary | ICD-10-CM

## 2016-03-06 LAB — HEPATIC FUNCTION PANEL
ALBUMIN: 4.7 g/dL (ref 3.6–4.8)
ALK PHOS: 63 IU/L (ref 39–117)
ALT: 13 IU/L (ref 0–44)
AST: 18 IU/L (ref 0–40)
BILIRUBIN TOTAL: 0.3 mg/dL (ref 0.0–1.2)
BILIRUBIN, DIRECT: 0.09 mg/dL (ref 0.00–0.40)
Total Protein: 7.6 g/dL (ref 6.0–8.5)

## 2016-03-17 ENCOUNTER — Telehealth: Payer: Self-pay

## 2016-03-17 NOTE — Telephone Encounter (Signed)
-----   Message from Lucilla Lame, MD sent at 03/07/2016 11:51 AM EDT ----- Let the patient know his liver enzymes are normal.

## 2016-03-17 NOTE — Telephone Encounter (Signed)
Pt notified of lab result. 

## 2016-04-07 ENCOUNTER — Ambulatory Visit (INDEPENDENT_AMBULATORY_CARE_PROVIDER_SITE_OTHER): Payer: Medicare Other | Admitting: Gastroenterology

## 2016-04-07 ENCOUNTER — Other Ambulatory Visit: Payer: Self-pay

## 2016-04-07 ENCOUNTER — Encounter: Payer: Self-pay | Admitting: Gastroenterology

## 2016-04-07 ENCOUNTER — Other Ambulatory Visit
Admission: RE | Admit: 2016-04-07 | Discharge: 2016-04-07 | Disposition: A | Discharge status: Home / Self Care | Payer: Medicare Other | Source: Ambulatory Visit | Attending: Gastroenterology | Admitting: Gastroenterology

## 2016-04-07 VITALS — BP 132/69 | HR 80 | Temp 98.2°F | Ht 71.0 in | Wt 221.5 lb

## 2016-04-07 DIAGNOSIS — B1920 Unspecified viral hepatitis C without hepatic coma: Secondary | ICD-10-CM

## 2016-04-07 NOTE — Progress Notes (Signed)
Primary Care Physician: Park Liter, DO  Primary Gastroenterologist:  Dr. Lucilla Lame  Chief Complaint  Patient presents with  . Hepatitis C    1 year follow up    HPI: Nathan Cooper is a 70 y.o. male here for follow-up after treatment for his hepatitis C. The patient has been a year out and states that he feels fine. He has no complaint the present time.  Current Outpatient Prescriptions  Medication Sig Dispense Refill  . amLODipine (NORVASC) 10 MG tablet Take 1 tablet (10 mg total) by mouth daily. 90 tablet 1  . aspirin EC 81 MG tablet Take 1 tablet (81 mg total) by mouth daily. 100 tablet 12  . benazepril (LOTENSIN) 40 MG tablet Take 1 tablet (40 mg total) by mouth daily. 90 tablet 1  . triamcinolone ointment (KENALOG) 0.5 % Apply 1 application topically 2 (two) times daily. 30 g 0  . valACYclovir (VALTREX) 500 MG tablet Take 500 mg by mouth 2 (two) times daily.    . varenicline (CHANTIX CONTINUING MONTH PAK) 1 MG tablet Take 1 tablet (1 mg total) by mouth 2 (two) times daily. 60 tablet 3  . varenicline (CHANTIX PAK) 0.5 MG X 11 & 1 MG X 42 tablet Take one 0.5 mg tablet by mouth once daily x3 days, then increase to one 0.5 mg tab BID x4 days, then increase to one 1 mg tablet BID. 53 tablet 0  . sildenafil (REVATIO) 20 MG tablet Take 1-5 tabs 30 minutes prior to intercourse (Patient not taking: Reported on 04/07/2016) 90 tablet 12   No current facility-administered medications for this visit.     Allergies as of 04/07/2016  . (No Known Allergies)    ROS:  General: Negative for anorexia, weight loss, fever, chills, fatigue, weakness. ENT: Negative for hoarseness, difficulty swallowing , nasal congestion. CV: Negative for chest pain, angina, palpitations, dyspnea on exertion, peripheral edema.  Respiratory: Negative for dyspnea at rest, dyspnea on exertion, cough, sputum, wheezing.  GI: See history of present illness. GU:  Negative for dysuria, hematuria, urinary  incontinence, urinary frequency, nocturnal urination.  Endo: Negative for unusual weight change.    Physical Examination:   BP 132/69   Pulse 80   Temp 98.2 F (36.8 C) (Oral)   Ht 5\' 11"  (1.803 m)   Wt 221 lb 8 oz (100.5 kg)   BMI 30.89 kg/m   General: Well-nourished, well-developed in no acute distress.  Eyes: No icterus. Conjunctivae pink. Mouth: Oropharyngeal mucosa moist and pink , no lesions erythema or exudate. Lungs: Clear to auscultation bilaterally. Non-labored. Heart: Regular rate and rhythm, no murmurs rubs or gallops.  Abdomen: Bowel sounds are normal, nontender, nondistended, no hepatosplenomegaly or masses, no abdominal bruits or hernia , no rebound or guarding.   Extremities: No lower extremity edema. No clubbing or deformities. Neuro: Alert and oriented x 3.  Grossly intact. Skin: Warm and dry, no jaundice.   Psych: Alert and cooperative, normal mood and affect.  Labs:    Imaging Studies: No results found.  Assessment and Plan:   Nathan Cooper is a 70 y.o. y/o male who is one year out from treatment for his hepatitis C. The patient has been doing well. The patient had recent lab work that showed him to have abnormal liver enzymes. The patient will be set up for a viral load blood test. If this is negative the patient the only follow up as needed.   Note: This dictation was prepared with  Dragon dictation along with smaller Company secretary. Any transcriptional errors that result from this process are unintentional.

## 2016-04-08 LAB — HCV RNA QUANT: HCV QUANT: NOT DETECTED [IU]/mL (ref >50)

## 2016-04-09 ENCOUNTER — Telehealth: Payer: Self-pay

## 2016-04-09 ENCOUNTER — Other Ambulatory Visit: Payer: Self-pay

## 2016-04-09 NOTE — Telephone Encounter (Signed)
Lab results mailed to pt.

## 2016-04-09 NOTE — Telephone Encounter (Signed)
-----   Message from Lucilla Lame, MD sent at 04/08/2016  6:17 PM EDT ----- Let the patient know that his hepatitis C viral load was negative

## 2016-05-07 ENCOUNTER — Encounter: Payer: Self-pay | Admitting: Family Medicine

## 2016-05-07 ENCOUNTER — Ambulatory Visit (INDEPENDENT_AMBULATORY_CARE_PROVIDER_SITE_OTHER): Payer: Medicare Other | Admitting: Family Medicine

## 2016-05-07 VITALS — BP 143/78 | HR 87 | Temp 99.0°F | Wt 215.7 lb

## 2016-05-07 DIAGNOSIS — Z23 Encounter for immunization: Secondary | ICD-10-CM

## 2016-05-07 DIAGNOSIS — J209 Acute bronchitis, unspecified: Secondary | ICD-10-CM

## 2016-05-07 MED ORDER — AZITHROMYCIN 250 MG PO TABS
ORAL_TABLET | ORAL | 0 refills | Status: DC
Start: 2016-05-07 — End: 2016-05-18

## 2016-05-07 MED ORDER — PREDNISONE 10 MG PO TABS: ORAL_TABLET | ORAL | 0 refills | Status: DC

## 2016-05-07 MED ORDER — ALBUTEROL SULFATE (2.5 MG/3ML) 0.083% IN NEBU: 2.5000 mg | INHALATION_SOLUTION | Freq: Once | RESPIRATORY_TRACT | Status: DC

## 2016-05-07 NOTE — Progress Notes (Signed)
BP (!) 143/78 (BP Location: Left Arm, Patient Position: Sitting, Cuff Size: Large)   Pulse 87   Temp 99 F (37.2 C)   Wt 215 lb 11.2 oz (97.8 kg)   SpO2 96%   BMI 30.08 kg/m    Subjective:    Patient ID: Nathan Cooper, male    DOB: Dec 30, 1945, 70 y.o.   MRN: [id number]  HPI: Nathan Cooper is a 70 y.o. male  Chief Complaint  Patient presents with  . Cough   UPPER RESPIRATORY TRACT INFECTION Duration: 2 weeks Worst symptom: cough Fever: no Cough: yes Shortness of breath: yes Wheezing: yes Chest pain: yes, with cough Chest tightness: yes Chest congestion: yes Nasal congestion: no Runny nose: no Post nasal drip: no Sneezing: no Sore throat: no Swollen glands: no Sinus pressure: no Headache: no Face pain: no Toothache: no Ear pain: no  Ear pressure: no  Eyes red/itching:no Eye drainage/crusting: no  Vomiting: no Rash: no Fatigue: yes Sick contacts: no Strep contacts: no  Context: better Recurrent sinusitis: no Relief with OTC cold/cough medications: yes  Treatments attempted: cold/sinus, mucinex and cough syrup   Relevant past medical, surgical, family and social history reviewed and updated as indicated. Interim medical history since our last visit reviewed. Allergies and medications reviewed and updated.  Review of Systems  Constitutional: Positive for fatigue. Negative for activity change, appetite change, chills, diaphoresis, fever and unexpected weight change.  HENT: Negative for congestion, dental problem, drooling, ear discharge, ear pain, facial swelling, hearing loss, mouth sores, nosebleeds, postnasal drip, rhinorrhea, sinus pressure, sneezing, sore throat, tinnitus, trouble swallowing and voice change.   Respiratory: Positive for cough, chest tightness, shortness of breath and wheezing. Negative for apnea, choking and stridor.   Cardiovascular: Negative.   Psychiatric/Behavioral: Negative.     Per HPI unless specifically indicated  above     Objective:    BP (!) 143/78 (BP Location: Left Arm, Patient Position: Sitting, Cuff Size: Large)   Pulse 87   Temp 99 F (37.2 C)   Wt 215 lb 11.2 oz (97.8 kg)   SpO2 96%   BMI 30.08 kg/m   Wt Readings from Last 3 Encounters:  05/07/16 215 lb 11.2 oz (97.8 kg)  04/07/16 221 lb 8 oz (100.5 kg)  02/11/16 221 lb (100.2 kg)    Physical Exam  Constitutional: He is oriented to person, place, and time. He appears well-developed and well-nourished. No distress.  HENT:  Head: Normocephalic and atraumatic.  Right Ear: Hearing and external ear normal.  Left Ear: Hearing and external ear normal.  Nose: Nose normal.  Mouth/Throat: Oropharynx is clear and moist. No oropharyngeal exudate.  Eyes: Conjunctivae, EOM and lids are normal. Pupils are equal, round, and reactive to light. Right eye exhibits no discharge. Left eye exhibits no discharge. No scleral icterus.  Neck: Normal range of motion. Neck supple. No JVD present. No tracheal deviation present. No thyromegaly present.  Cardiovascular: Normal rate, regular rhythm, normal heart sounds and intact distal pulses.  Exam reveals no gallop and no friction rub.   No murmur heard. Pulmonary/Chest: Effort normal. No stridor. No respiratory distress. He has wheezes. He has no rales. He exhibits no tenderness.  Musculoskeletal: Normal range of motion.  Lymphadenopathy:    He has no cervical adenopathy.  Neurological: He is alert and oriented to person, place, and time.  Skin: Skin is warm, dry and intact. No rash noted. He is not diaphoretic. No erythema. No pallor.  Psychiatric: He has a  normal mood and affect. His speech is normal and behavior is normal. Judgment and thought content normal. Cognition and memory are normal.  Nursing note and vitals reviewed.   Results for orders placed or performed during the hospital encounter of 04/07/16  HCV RNA quant  Result Value Ref Range   HCV Quantitative HCV Not Detected >50 IU/mL   Test  Information Comment       Assessment & Plan:   Problem List Items Addressed This Visit    None    Visit Diagnoses    Acute bronchitis, unspecified organism    -  Primary   Better after neb. Will start prednisone and z-pack and recheck in 2 weeks with spiro.    Relevant Medications   albuterol (PROVENTIL) (2.5 MG/3ML) 0.083% nebulizer solution 2.5 mg (Start on 05/07/2016 11:30 AM)   Immunization due       Flu shot given today.   Relevant Orders   Flu vaccine HIGH DOSE PF (Fluzone High dose) (Completed)       Follow up plan: No Follow-up on file.

## 2016-05-07 NOTE — Patient Instructions (Signed)

## 2016-05-18 ENCOUNTER — Ambulatory Visit (INDEPENDENT_AMBULATORY_CARE_PROVIDER_SITE_OTHER): Payer: Medicare Other | Admitting: Family Medicine

## 2016-05-18 ENCOUNTER — Encounter: Payer: Self-pay | Admitting: Family Medicine

## 2016-05-18 VITALS — BP 120/56 | HR 88 | Temp 98.5°F | Wt 218.2 lb

## 2016-05-18 DIAGNOSIS — J209 Acute bronchitis, unspecified: Secondary | ICD-10-CM | POA: Diagnosis not present

## 2016-05-18 NOTE — Progress Notes (Signed)
   BP (!) 120/56   Pulse 88   Temp 98.5 F (36.9 C)   Wt 218 lb 3.2 oz (99 kg)   SpO2 96%   BMI 30.43 kg/m    Subjective:    Patient ID: Nathan Cooper, male    DOB: [birth year], 70 y.o.   MRN: [id number]  HPI: Nathan Cooper is a 70 y.o. male  Chief Complaint  Patient presents with  . Lung recheck   Feeling well. No concerns. Lungs back to normal.   Relevant past medical, surgical, family and social history reviewed and updated as indicated. Interim medical history since our last visit reviewed. Allergies and medications reviewed and updated.  Review of Systems  Constitutional: Negative.   Respiratory: Negative.   Cardiovascular: Negative.   Psychiatric/Behavioral: Negative.     Per HPI unless specifically indicated above     Objective:    BP (!) 120/56   Pulse 88   Temp 98.5 F (36.9 C)   Wt 218 lb 3.2 oz (99 kg)   SpO2 96%   BMI 30.43 kg/m   Wt Readings from Last 3 Encounters:  05/18/16 218 lb 3.2 oz (99 kg)  05/07/16 215 lb 11.2 oz (97.8 kg)  04/07/16 221 lb 8 oz (100.5 kg)    Physical Exam  Constitutional: He is oriented to person, place, and time. He appears well-developed and well-nourished. No distress.  HENT:  Head: Normocephalic and atraumatic.  Right Ear: Hearing normal.  Left Ear: Hearing normal.  Nose: Nose normal.  Eyes: Conjunctivae and lids are normal. Right eye exhibits no discharge. Left eye exhibits no discharge. No scleral icterus.  Cardiovascular: Normal rate, regular rhythm, normal heart sounds and intact distal pulses.  Exam reveals no gallop and no friction rub.   No murmur heard. Pulmonary/Chest: Effort normal and breath sounds normal. No respiratory distress. He has no wheezes. He has no rales. He exhibits no tenderness.  Musculoskeletal: Normal range of motion.  Neurological: He is alert and oriented to person, place, and time.  Skin: Skin is warm, dry and intact. No rash noted. He is not diaphoretic. No erythema. No  pallor.  Psychiatric: He has a normal mood and affect. His speech is normal and behavior is normal. Judgment and thought content normal. Cognition and memory are normal.  Nursing note and vitals reviewed.   Results for orders placed or performed during the hospital encounter of 04/07/16  HCV RNA quant  Result Value Ref Range   HCV Quantitative HCV Not Detected >50 IU/mL   Test Information Comment       Assessment & Plan:   Problem List Items Addressed This Visit    None    Visit Diagnoses    Acute bronchitis, unspecified organism    -  Primary   Resolved. Continue inhalers. Call with any concerns.        Follow up plan: Return As scheduled.

## 2016-07-28 ENCOUNTER — Encounter: Payer: Medicare Other | Admitting: Family Medicine

## 2016-08-12 ENCOUNTER — Encounter: Payer: Medicare Other | Admitting: Family Medicine

## 2016-08-17 ENCOUNTER — Other Ambulatory Visit: Payer: Self-pay

## 2016-08-17 ENCOUNTER — Ambulatory Visit (INDEPENDENT_AMBULATORY_CARE_PROVIDER_SITE_OTHER): Payer: Medicare Other | Admitting: Family Medicine

## 2016-08-17 ENCOUNTER — Encounter: Payer: Self-pay | Admitting: Family Medicine

## 2016-08-17 VITALS — BP 134/75 | HR 92 | Temp 98.8°F | Ht 70.5 in | Wt 219.0 lb

## 2016-08-17 DIAGNOSIS — S93401A Sprain of unspecified ligament of right ankle, initial encounter: Secondary | ICD-10-CM | POA: Diagnosis not present

## 2016-08-17 DIAGNOSIS — B182 Chronic viral hepatitis C: Secondary | ICD-10-CM

## 2016-08-17 DIAGNOSIS — Z87891 Personal history of nicotine dependence: Secondary | ICD-10-CM | POA: Diagnosis not present

## 2016-08-17 DIAGNOSIS — R7301 Impaired fasting glucose: Secondary | ICD-10-CM | POA: Diagnosis not present

## 2016-08-17 DIAGNOSIS — I2510 Atherosclerotic heart disease of native coronary artery without angina pectoris: Secondary | ICD-10-CM

## 2016-08-17 DIAGNOSIS — Z0000 Encounter for general adult medical examination without abnormal findings: Secondary | ICD-10-CM

## 2016-08-17 DIAGNOSIS — Z125 Encounter for screening for malignant neoplasm of prostate: Secondary | ICD-10-CM | POA: Diagnosis not present

## 2016-08-17 DIAGNOSIS — J449 Chronic obstructive pulmonary disease, unspecified: Secondary | ICD-10-CM | POA: Diagnosis not present

## 2016-08-17 DIAGNOSIS — Z9989 Dependence on other enabling machines and devices: Secondary | ICD-10-CM | POA: Diagnosis not present

## 2016-08-17 DIAGNOSIS — G4733 Obstructive sleep apnea (adult) (pediatric): Secondary | ICD-10-CM | POA: Diagnosis not present

## 2016-08-17 DIAGNOSIS — I10 Essential (primary) hypertension: Secondary | ICD-10-CM | POA: Diagnosis not present

## 2016-08-17 DIAGNOSIS — I700 Atherosclerosis of aorta: Secondary | ICD-10-CM

## 2016-08-17 LAB — MICROSCOPIC EXAMINATION
Bacteria, UA: NONE SEEN
Epithelial Cells (non renal): NONE SEEN /hpf (ref 0–10)
RBC MICROSCOPIC, UA: NONE SEEN /HPF (ref 0–?)
WBC UA: NONE SEEN /HPF (ref 0–?)

## 2016-08-17 LAB — UA/M W/RFLX CULTURE, ROUTINE
BILIRUBIN UA: NEGATIVE
GLUCOSE, UA: NEGATIVE
Leukocytes, UA: NEGATIVE
NITRITE UA: NEGATIVE
Protein, UA: NEGATIVE
RBC, UA: NEGATIVE
UUROB: 1 mg/dL (ref 0.2–1.0)
pH, UA: 5.5 (ref 5.0–7.5)

## 2016-08-17 LAB — BAYER DCA HB A1C WAIVED: HB A1C (BAYER DCA - WAIVED): 6.2 % (ref <7.0)

## 2016-08-17 MED ORDER — TRIAMCINOLONE ACETONIDE 0.5 % EX OINT: 1.0000 "application " | TOPICAL_OINTMENT | Freq: Two times a day (BID) | CUTANEOUS | 0 refills | Status: DC

## 2016-08-17 MED ORDER — VARENICLINE TARTRATE 0.5 MG X 11 & 1 MG X 42 PO MISC: ORAL | 0 refills | Status: DC

## 2016-08-17 MED ORDER — BENAZEPRIL HCL 40 MG PO TABS: 40.0000 mg | ORAL_TABLET | Freq: Every day | ORAL | 1 refills | Status: DC

## 2016-08-17 MED ORDER — AMLODIPINE BESYLATE 10 MG PO TABS: 10.0000 mg | ORAL_TABLET | Freq: Every day | ORAL | 1 refills | Status: DC

## 2016-08-17 MED ORDER — VARENICLINE TARTRATE 1 MG PO TABS: 1.0000 mg | ORAL_TABLET | Freq: Two times a day (BID) | ORAL | 2 refills | Status: DC

## 2016-08-17 NOTE — Assessment & Plan Note (Signed)
Continue medical management. Call with any concerns.

## 2016-08-17 NOTE — Assessment & Plan Note (Signed)
Stable. Continue to monitor. Call with any concerns. Does not want medication at this time.

## 2016-08-17 NOTE — Assessment & Plan Note (Signed)
Stable. Continue CPAP. Call with any concerns.  

## 2016-08-17 NOTE — Assessment & Plan Note (Signed)
Rechecking levels today. 

## 2016-08-17 NOTE — Assessment & Plan Note (Signed)
Would like to restart chantix. Rx given today. Due in February for repeat low dose CT of his chest.

## 2016-08-17 NOTE — Patient Instructions (Addendum)
Preventative Services:  Health Risk Assessment and Personalized Prevention Plan: Done today Bone Mass Measurements: N/A CVD Screening: Done today Colon Cancer Screening: up to date Depression Screening: Done today Diabetes Screening: Done today Glaucoma Screening: See your eye doctor Hepatitis B vaccine: up to date Hepatitis C screening: up to date HIV Screening: up to date Flu Vaccine: Up to date Lung cancer Screening: up to date Obesity Screening: Done today Pneumonia Vaccines (2): Up to date STI Screening: Up to date PSA screening: Done today  Health Maintenance, Male A healthy lifestyle and preventative care can promote health and wellness.  Maintain regular health, dental, and eye exams.  Eat a healthy diet. Foods like vegetables, fruits, whole grains, low-fat dairy products, and lean protein foods contain the nutrients you need and are low in calories. Decrease your intake of foods high in solid fats, added sugars, and salt. Get information about a proper diet from your health care provider, if necessary.  Regular physical exercise is one of the most important things you can do for your health. Most adults should get at least 150 minutes of moderate-intensity exercise (any activity that increases your heart rate and causes you to sweat) each week. In addition, most adults need muscle-strengthening exercises on 2 or more days a week.   Maintain a healthy weight. The body mass index (BMI) is a screening tool to identify possible weight problems. It provides an estimate of body fat based on height and weight. Your health care provider can find your BMI and can help you achieve or maintain a healthy weight. For males 20 years and older:  A BMI below 18.5 is considered underweight.  A BMI of 18.5 to 24.9 is normal.  A BMI of 25 to 29.9 is considered overweight.  A BMI of 30 and above is considered obese.  Maintain normal blood lipids and cholesterol by exercising and minimizing  your intake of saturated fat. Eat a balanced diet with plenty of fruits and vegetables. Blood tests for lipids and cholesterol should begin at age 74 and be repeated every 5 years. If your lipid or cholesterol levels are high, you are over age 47, or you are at high risk for heart disease, you may need your cholesterol levels checked more frequently.Ongoing high lipid and cholesterol levels should be treated with medicines if diet and exercise are not working.  If you smoke, find out from your health care provider how to quit. If you do not use tobacco, do not start.  Lung cancer screening is recommended for adults aged 43-80 years who are at high risk for developing lung cancer because of a history of smoking. A yearly low-dose CT scan of the lungs is recommended for people who have at least a 30-pack-year history of smoking and are current smokers or have quit within the past 15 years. A pack year of smoking is smoking an average of 1 pack of cigarettes a day for 1 year (for example, a 30-pack-year history of smoking could mean smoking 1 pack a day for 30 years or 2 packs a day for 15 years). Yearly screening should continue until the smoker has stopped smoking for at least 15 years. Yearly screening should be stopped for people who develop a health problem that would prevent them from having lung cancer treatment.  If you choose to drink alcohol, do not have more than 2 drinks per day. One drink is considered to be 12 oz (360 mL) of beer, 5 oz (150 mL) of  wine, or 1.5 oz (45 mL) of liquor.  Avoid the use of street drugs. Do not share needles with anyone. Ask for help if you need support or instructions about stopping the use of drugs.  High blood pressure causes heart disease and increases the risk of stroke. High blood pressure is more likely to develop in:  People who have blood pressure in the end of the normal range (100-139/85-89 mm Hg).  People who are overweight or obese.  People who are  African American.  If you are 97-73 years of age, have your blood pressure checked every 3-5 years. If you are 59 years of age or older, have your blood pressure checked every year. You should have your blood pressure measured twice-once when you are at a hospital or clinic, and once when you are not at a hospital or clinic. Record the average of the two measurements. To check your blood pressure when you are not at a hospital or clinic, you can use:  An automated blood pressure machine at a pharmacy.  A home blood pressure monitor.  If you are 48-30 years old, ask your health care provider if you should take aspirin to prevent heart disease.  Diabetes screening involves taking a blood sample to check your fasting blood sugar level. This should be done once every 3 years after age 35 if you are at a normal weight and without risk factors for diabetes. Testing should be considered at a younger age or be carried out more frequently if you are overweight and have at least 1 risk factor for diabetes.  Colorectal cancer can be detected and often prevented. Most routine colorectal cancer screening begins at the age of 65 and continues through age 27. However, your health care provider may recommend screening at an earlier age if you have risk factors for colon cancer. On a yearly basis, your health care provider may provide home test kits to check for hidden blood in the stool. A small camera at the end of a tube may be used to directly examine the colon (sigmoidoscopy or colonoscopy) to detect the earliest forms of colorectal cancer. Talk to your health care provider about this at age 16 when routine screening begins. A direct exam of the colon should be repeated every 5-10 years through age 83, unless early forms of precancerous polyps or small growths are found.  People who are at an increased risk for hepatitis B should be screened for this virus. You are considered at high risk for hepatitis B  if:  You were born in a country where hepatitis B occurs often. Talk with your health care provider about which countries are considered high risk.  Your parents were born in a high-risk country and you have not received a shot to protect against hepatitis B (hepatitis B vaccine).  You have HIV or AIDS.  You use needles to inject street drugs.  You live with, or have sex with, someone who has hepatitis B.  You are a man who has sex with other men (MSM).  You get hemodialysis treatment.  You take certain medicines for conditions like cancer, organ transplantation, and autoimmune conditions.  Hepatitis C blood testing is recommended for all people born from [birth year] through 1963 and any individual with known risk factors for hepatitis C.  Healthy men should no longer receive prostate-specific antigen (PSA) blood tests as part of routine cancer screening. Talk to your health care provider about prostate cancer screening.  Testicular cancer screening  is not recommended for adolescents or adult males who have no symptoms. Screening includes self-exam, a health care provider exam, and other screening tests. Consult with your health care provider about any symptoms you have or any concerns you have about testicular cancer.  Practice safe sex. Use condoms and avoid high-risk sexual practices to reduce the spread of sexually transmitted infections (STIs).  You should be screened for STIs, including gonorrhea and chlamydia if:  You are sexually active and are younger than 24 years.  You are older than 24 years, and your health care provider tells you that you are at risk for this type of infection.  Your sexual activity has changed since you were last screened, and you are at an increased risk for chlamydia or gonorrhea. Ask your health care provider if you are at risk.  If you are at risk of being infected with HIV, it is recommended that you take a prescription medicine daily to prevent HIV  infection. This is called pre-exposure prophylaxis (PrEP). You are considered at risk if:  You are a man who has sex with other men (MSM).  You are a heterosexual man who is sexually active with multiple partners.  You take drugs by injection.  You are sexually active with a partner who has HIV.  Talk with your health care provider about whether you are at high risk of being infected with HIV. If you choose to begin PrEP, you should first be tested for HIV. You should then be tested every 3 months for as long as you are taking PrEP.  Use sunscreen. Apply sunscreen liberally and repeatedly throughout the day. You should seek shade when your shadow is shorter than you. Protect yourself by wearing long sleeves, pants, a wide-brimmed hat, and sunglasses year round whenever you are outdoors.  Tell your health care provider of new moles or changes in moles, especially if there is a change in shape or color. Also, tell your health care provider if a mole is larger than the size of a pencil eraser.  A one-time screening for abdominal aortic aneurysm (AAA) and surgical repair of large AAAs by ultrasound is recommended for men aged 75-75 years who are current or former smokers.  Stay current with your vaccines (immunizations). This information is not intended to replace advice given to you by your health care provider. Make sure you discuss any questions you have with your health care provider. Document Released: 01/01/2008 Document Revised: 07/26/2014 Document Reviewed: 04/08/2015 Elsevier Interactive Patient Education  2017 [place] Directive Advance directives are the legal documents that allow you to make choices about your health care and medical treatment if you cannot speak for yourself. Advance directives are a way for you to communicate your wishes to family, friends, and health care providers. The specified people can then convey your decisions about end-of-life care to avoid  confusion if you should become unable to communicate. Ideally, the process of discussing and writing advance directives should happen over time rather than making decisions all at once. Advance directives can be modified as your situation changes, and you can change your mind at any time, even after you have signed the advance directives. Each state has its own laws regarding advance directives. You may want to check with your health care provider, attorney, or state representative about the law in your state. Below are some examples of advance directives. HEALTH CARE PROXY AND DURABLE POWER OF ATTORNEY FOR HEALTH CARE A health care proxy is a  person (agent) appointed to make medical decisions for you if you cannot. Generally, people choose someone they know well and trust to represent their preferences when they can no longer do so. You should be sure to ask this person for agreement to act as your agent. An agent may have to exercise judgment in the event of a medical decision for which your wishes are not known. A durable power of attorney for health care is a legal document that names your health care proxy. Depending on the laws in your state, after the document is written, it may also need to be:  Signed.  Notarized.  Dated.  Copied.  Witnessed.  Incorporated into your medical record. You may also want to appoint someone to manage your financial affairs if you cannot. This is called a durable power of attorney for finances. It is a separate legal document from the durable power of attorney for health care. You may choose the same person or someone different from your health care proxy to act as your agent in financial matters. LIVING WILL A living will is a set of instructions documenting your wishes about medical care when you cannot care for yourself. It is used if you become:  Terminally ill.  Incapacitated.  Unable to communicate.  Unable to make decisions. Items to consider in  your living will include:  The use or non-use of life-sustaining equipment, such as dialysis machines and breathing machines (ventilators).  A do not resuscitate (DNR) order, which is the instruction not to use cardiopulmonary resuscitation (CPR) if breathing or heartbeat stops.  Tube feeding.  Withholding of food and fluids.  Comfort (palliative) care when the goal becomes comfort rather than a cure.  Organ and tissue donation. A living will does not give instructions about distribution of your money and property if you should pass away. It is advisable to seek the expert advice of a lawyer in drawing up a will regarding your possessions. Decisions about taxes, beneficiaries, and asset distribution will be legally binding. This process can relieve your family and friends of any burdens surrounding disputes or questions that may come up about the allocation of your assets. DO NOT RESUSCITATE (DNR) A do not resuscitate (DNR) order is a request to not have CPR in the event that your heart stops beating or you stop breathing. Unless given other instructions, a health care provider will try to help any patient whose heart has stopped or who has stopped breathing.  This information is not intended to replace advice given to you by your health care provider. Make sure you discuss any questions you have with your health care provider. Document Released: 10/12/2007 Document Revised: 10/27/2015 Document Reviewed: 11/22/2012 Elsevier Interactive Patient Education  2017 [employer]  Ankle Sprain Introduction An ankle sprain is a stretch or tear in one of the tough tissues (ligaments) in your ankle. Follow these instructions at home:  Rest your ankle.  Take over-the-counter and prescription medicines only as told by your doctor.  For 2-3 days, keep your ankle higher than the level of your heart (elevated) as much as possible.  If directed, put ice on the area:  Put ice in a plastic  bag.  Place a towel between your skin and the bag.  Leave the ice on for 20 minutes, 2-3 times a day.  If you were given a brace:  Wear it as told.  Take it off to shower or bathe.  Try not to move your ankle much, but  wiggle your toes from time to time. This helps to prevent swelling.  If you were given an elastic bandage (dressing):  Take it off when you shower or bathe.  Try not to move your ankle much, but wiggle your toes from time to time. This helps to prevent swelling.  Adjust the bandage to make it more comfortable if it feels too tight.  Loosen the bandage if you lose feeling in your foot, your foot tingles, or your foot gets cold and blue.  If you have crutches, use them as told by your doctor. Continue to use them until you can walk without feeling pain in your ankle. Contact a doctor if:  Your bruises or swelling are quickly getting worse.  Your pain does not get better after you take medicine. Get help right away if:  You cannot feel your toes or foot.  Your toes or your foot looks blue.  You have very bad pain that gets worse. This information is not intended to replace advice given to you by your health care provider. Make sure you discuss any questions you have with your health care provider. Document Released: 12/22/2007 Document Revised: 12/11/2015 Document Reviewed: 02/04/2015  2017 Elsevier

## 2016-08-17 NOTE — Assessment & Plan Note (Signed)
A1c 6.2. Continue diet and exercise. Call with any concerns.

## 2016-08-17 NOTE — Progress Notes (Signed)
BP 134/75   Pulse 92   Temp 98.8 F (37.1 C)   Ht 5' 10.5" (1.791 m)   Wt 219 lb (99.3 kg)   SpO2 97%   BMI 30.98 kg/m    Subjective:    Patient ID: Nathan Cooper, male    DOB: [birth year], 71 y.o.   MRN: [id number]  HPI: Nathan Cooper is a 72 y.o. male presenting on 08/17/2016 for comprehensive medical examination. Current medical complaints include:  ANKLE PAIN Duration: 10 days Involved Ankle: right Mechanism of injury: trauma- trying to push a car out of the snow and heard a pop Location: achilles and into his ankle Onset: sudden  Severity: severe  Quality:  sharp Frequency: constant Radiation: no Aggravating factors: weight bearing, walking and running  Alleviating factors: ice and NSAIDs  Status: better Treatments attempted: ice and ibuprofen  Relief with NSAIDs?:  moderate Weakness with weight bearing or walking: no Morning stiffness: no Swelling: yes Redness: no Bruising: no Paresthesias / decreased sensation: yes  Fevers:no  HYPERTENSION Hypertension status: stable  Satisfied with current treatment? yes Duration of hypertension: chronic BP monitoring frequency:  not checking BP medication side effects:  no Medication compliance: excellent compliance Previous BP meds: benazepril and amlodipine Aspirin: yes Recurrent headaches: no Visual changes: no Palpitations: no Dyspnea: no Chest pain: no Lower extremity edema: no Dizzy/lightheaded: no  COPD COPD status: controlled Satisfied with current treatment?: yes Oxygen use: no Dyspnea frequency: occasionally Cough frequency: never Rescue inhaler frequency: never  Limitation of activity: no Productive cough: no Pneumovax: Up to Date Influenza: Up to Date  Impaired Fasting Glucose HbA1C:  Lab Results  Component Value Date   HGBA1C 6.1 (H) 07/17/2015   Duration of elevated blood sugar: chronic Polydipsia: no Polyuria: no Weight change: no Visual disturbance: no Glucose  Monitoring: no Diabetic Education: Not Completed Family history of diabetes: yes  He currently lives with: alone Interim Problems from his last visit: no  Functional Status Survey: Is the patient deaf or have difficulty hearing?: No Does the patient have difficulty seeing, even when wearing glasses/contacts?: No Does the patient have difficulty concentrating, remembering, or making decisions?: No Does the patient have difficulty walking or climbing stairs?: No Does the patient have difficulty dressing or bathing?: No Does the patient have difficulty doing errands alone such as visiting a doctor's office or shopping?: No  FALL RISK: Fall Risk  08/17/2016 07/20/2015 12/25/2014  Falls in the past year? No No No    Depression Screen Depression screen [facility] 2/9 08/17/2016 07/20/2015 12/25/2014  Decreased Interest 0 0 0  Down, Depressed, Hopeless 0 0 0  PHQ - 2 Score 0 0 0  Altered sleeping - 0 -  Tired, decreased energy - 0 -  Change in appetite - 2 -  Feeling bad or failure about yourself  - 0 -  Trouble concentrating - 0 -  Moving slowly or fidgety/restless - 0 -  Suicidal thoughts - 0 -  PHQ-9 Score - 2 -  Difficult doing work/chores - Somewhat difficult -    Advanced Directives See Appropriate Area of Chart  Past Medical History:  Past Medical History:  Diagnosis Date  . Hepatitis C   . Hernia, inguinal 2014  . Hypertension 2009  . Personal history of tobacco use, presenting hazards to health 08/26/2015  . Sleep apnea     Surgical History:  Past Surgical History:  Procedure Laterality Date  . COLONOSCOPY  2010  . HERNIA REPAIR  2014  Medications:  Current Outpatient Prescriptions on File Prior to Visit  Medication Sig  . aspirin EC 81 MG tablet Take 1 tablet (81 mg total) by mouth daily.  . valACYclovir (VALTREX) 500 MG tablet Take 500 mg by mouth 2 (two) times daily.   No current facility-administered medications on file prior to visit.     Allergies:  No Known  Allergies  Social History:  Social History   Social History  . Marital status: Married    Spouse name: N/A  . Number of children: N/A  . Years of education: N/A   Occupational History  . Not on file.   Social History Main Topics  . Smoking status: Current Every Day Smoker    Packs/day: 1.00    Years: 45.00    Types: Cigarettes  . Smokeless tobacco: Never Used  . Alcohol use No  . Drug use: No  . Sexual activity: Not on file   Other Topics Concern  . Not on file   Social History Narrative  . No narrative on file   History  Smoking Status  . Current Every Day Smoker  . Packs/day: 1.00  . Years: 45.00  . Types: Cigarettes  Smokeless Tobacco  . Never Used   History  Alcohol Use No    Family History:  Family History  Problem Relation Age of Onset  . Hypertension Mother   . Cirrhosis Father   . Gout Son   . Heart disease Maternal Uncle 25  . Cancer Paternal Uncle   . Diabetes Paternal Grandmother     Past medical history, surgical history, medications, allergies, family history and social history reviewed with patient today and changes made to appropriate areas of the chart.   Review of Systems  Constitutional: Negative.   HENT: Negative.   Eyes: Negative.   Respiratory: Positive for shortness of breath. Negative for cough, hemoptysis, sputum production and wheezing.   Cardiovascular: Negative.   Gastrointestinal: Negative.   Genitourinary: Negative.   Musculoskeletal: Negative.   Skin: Negative.   Neurological: Negative.   Endo/Heme/Allergies: Negative.   Psychiatric/Behavioral: Negative.     All other ROS negative except what is listed above and in the HPI.      Objective:    BP 134/75   Pulse 92   Temp 98.8 F (37.1 C)   Ht 5' 10.5" (1.791 m)   Wt 219 lb (99.3 kg)   SpO2 97%   BMI 30.98 kg/m   Wt Readings from Last 3 Encounters:  08/17/16 219 lb (99.3 kg)  05/18/16 218 lb 3.2 oz (99 kg)  05/07/16 215 lb 11.2 oz (97.8 kg)      Hearing Screening   125Hz  250Hz  500Hz  1000Hz  2000Hz  3000Hz  4000Hz  6000Hz  8000Hz   Right ear:   Pass Pass Pass  Pass    Left ear:   Pass Pass Pass  Pass      Visual Acuity Screening   Right eye Left eye Both eyes  Without correction: 20/30 20/25   With correction:       Physical Exam  Constitutional: He is oriented to person, place, and time. He appears well-developed and well-nourished. No distress.  HENT:  Head: Normocephalic and atraumatic.  Right Ear: Hearing, tympanic membrane, external ear and ear canal normal.  Left Ear: Hearing, tympanic membrane, external ear and ear canal normal.  Nose: Nose normal.  Mouth/Throat: Uvula is midline, oropharynx is clear and moist and mucous membranes are normal. No oropharyngeal exudate.  Eyes: Conjunctivae, EOM and lids  are normal. Pupils are equal, round, and reactive to light. Right eye exhibits no discharge. Left eye exhibits no discharge. No scleral icterus.  Neck: Normal range of motion. Neck supple. No JVD present. No tracheal deviation present. No thyromegaly present.  Cardiovascular: Normal rate, regular rhythm, normal heart sounds and intact distal pulses.  Exam reveals no gallop and no friction rub.   No murmur heard. Pulmonary/Chest: Effort normal and breath sounds normal. No stridor. No respiratory distress. He has no wheezes. He has no rales. He exhibits no tenderness.  Abdominal: Soft. Bowel sounds are normal. He exhibits no distension and no mass. There is no tenderness. There is no rebound and no guarding.  Genitourinary:  Genitourinary Comments: Deferred at patient's request  Musculoskeletal: He exhibits edema and tenderness. He exhibits no deformity.  Mild tenderness and swelling to achilles. Slight decreased ROM. No masses  Lymphadenopathy:    He has no cervical adenopathy.  Neurological: He is alert and oriented to person, place, and time. He has normal reflexes. He displays normal reflexes. No cranial nerve deficit. He  exhibits normal muscle tone. Coordination normal.  Skin: Skin is warm, dry and intact. No rash noted. He is not diaphoretic. No erythema. No pallor.  Psychiatric: He has a normal mood and affect. His speech is normal and behavior is normal. Judgment and thought content normal. Cognition and memory are normal.  Nursing note and vitals reviewed.  6CIT Screen 08/17/2016  What Year? 0 points  What month? 0 points  What time? 0 points  Count back from 20 0 points  Months in reverse 0 points  Repeat phrase 2 points  Total Score 2     Results for orders placed or performed during the hospital encounter of 04/07/16  HCV RNA quant  Result Value Ref Range   HCV Quantitative HCV Not Detected >50 IU/mL   Test Information Comment       Assessment & Plan:   Problem List Items Addressed This Visit      Cardiovascular and Mediastinum   HTN (hypertension)    Under good control. Continue current regimen. Continue to monitor. Call with any concerns.       Relevant Medications   benazepril (LOTENSIN) 40 MG tablet   amLODipine (NORVASC) 10 MG tablet   Other Relevant Orders   CBC with Differential/Platelet   Comprehensive metabolic panel   Urine Microalbumin w/creat. ratio   TSH   CAD (coronary artery disease)    Continue medical management. Call with any concerns.       Relevant Medications   benazepril (LOTENSIN) 40 MG tablet   amLODipine (NORVASC) 10 MG tablet   Other Relevant Orders   CBC with Differential/Platelet   Comprehensive metabolic panel   Lipid Panel w/o Chol/HDL Ratio   TSH   Atherosclerosis of aorta (HCC)    Continue medical management. Call with any concerns.       Relevant Medications   benazepril (LOTENSIN) 40 MG tablet   amLODipine (NORVASC) 10 MG tablet     Respiratory   COPD (chronic obstructive pulmonary disease) (HCC)    Stable. Continue to monitor. Call with any concerns. Does not want medication at this time.       Relevant Medications    varenicline (CHANTIX STARTING MONTH PAK) 0.5 MG X 11 & 1 MG X 42 tablet   varenicline (CHANTIX CONTINUING MONTH PAK) 1 MG tablet   Other Relevant Orders   CBC with Differential/Platelet   Comprehensive metabolic panel   OSA  on CPAP    Stable. Continue CPAP. Call with any concerns.         Digestive   Hep C w/o coma, chronic (HCC)    Rechecking levels today.       Relevant Orders   Comprehensive metabolic panel   HCV RNA quant     Endocrine   IFG (impaired fasting glucose)    A1c 6.2. Continue diet and exercise. Call with any concerns.       Relevant Orders   Bayer DCA Hb A1c Waived   Comprehensive metabolic panel   Lipid Panel w/o Chol/HDL Ratio   Urine Microalbumin w/creat. ratio     Other   Personal history of tobacco use, presenting hazards to health    Would like to restart chantix. Rx given today. Due in February for repeat low dose CT of his chest.       Relevant Orders   CBC with Differential/Platelet   Comprehensive metabolic panel   UA/M w/rflx Culture, Routine    Other Visit Diagnoses    Medicare annual wellness visit, subsequent    -  Primary   Discussed preventative care as below. Continue to monitor. Call with any concerns.    Screening for prostate cancer       Labs drawn today. Await results.    Relevant Orders   PSA   Sprain of right ankle, unspecified ligament, initial encounter       RICE. Exercises given. Call if not getting better or getting worse.        Preventative Services:  Health Risk Assessment and Personalized Prevention Plan: Done today Bone Mass Measurements: N/A CVD Screening: Done today Colon Cancer Screening: up to date Depression Screening: Done today Diabetes Screening: Done today Glaucoma Screening: See your eye doctor Hepatitis B vaccine: up to date Hepatitis C screening: up to date HIV Screening: up to date Flu Vaccine: Up to date Lung cancer Screening: up to date Obesity Screening: Done today Pneumonia Vaccines  (2): Up to date STI Screening: Up to date PSA screening: Done today  Discussed aspirin prophylaxis for myocardial infarction prevention and decision was made to continue ASA  LABORATORY TESTING:  Health maintenance labs ordered today as discussed above.   The natural history of prostate cancer and ongoing controversy regarding screening and potential treatment outcomes of prostate cancer has been discussed with the patient. The meaning of a false positive PSA and a false negative PSA has been discussed. He indicates understanding of the limitations of this screening test and wishes to proceed with screening PSA testing.   IMMUNIZATIONS:   - Tdap: Tetanus vaccination status reviewed: last tetanus booster within 10 years. - Influenza: Up to date - Pneumovax: Up to date - Prevnar: Up to date - Zostavax vaccine: Refused  SCREENING: - Colonoscopy: Up to date  Discussed with patient purpose of the colonoscopy is to detect colon cancer at curable precancerous or early stages   - AAA Screening: Not applicable  -Hearing Test: Ordered today  -Spirometry: Done elsewhere   PATIENT COUNSELING:    Sexuality: Discussed sexually transmitted diseases, partner selection, use of condoms, avoidance of unintended pregnancy  and contraceptive alternatives.   Advised to avoid cigarette smoking.  I discussed with the patient that most people either abstain from alcohol or drink within safe limits (<=14/week and <=4 drinks/occasion for males, <=7/weeks and <= 3 drinks/occasion for females) and that the risk for alcohol disorders and other health effects rises proportionally with the number of drinks per  week and how often a drinker exceeds daily limits.  Discussed cessation/primary prevention of drug use and availability of treatment for abuse.   Diet: Encouraged to adjust caloric intake to maintain  or achieve ideal body weight, to reduce intake of dietary saturated fat and total fat, to limit sodium  intake by avoiding high sodium foods and not adding table salt, and to maintain adequate dietary potassium and calcium preferably from fresh fruits, vegetables, and low-fat dairy products.    stressed the importance of regular exercise  Injury prevention: Discussed safety belts, safety helmets, smoke detector, smoking near bedding or upholstery.   Dental health: Discussed importance of regular tooth brushing, flossing, and dental visits.   Follow up plan: NEXT PREVENTATIVE PHYSICAL DUE IN 1 YEAR. Return in about 6 months (around 02/14/2017) for BP/IFG follow up.

## 2016-08-17 NOTE — Assessment & Plan Note (Signed)
Under good control. Continue current regimen. Continue to monitor. Call with any concerns. 

## 2016-08-18 ENCOUNTER — Telehealth: Payer: Self-pay | Admitting: *Deleted

## 2016-08-18 DIAGNOSIS — Z87891 Personal history of nicotine dependence: Secondary | ICD-10-CM

## 2016-08-18 NOTE — Telephone Encounter (Signed)
Notified patient that annual lung cancer screening low dose CT scan is due. Confirmed that patient is within the age range of 55-77, and asymptomatic, (no signs or symptoms of lung cancer). Patient denies illness that would prevent curative treatment for lung cancer if found. The patient is a current smoker, with a 46 pack year history. The shared decision making visit was done 08/27/15. Patient is agreeable for CT scan being scheduled.

## 2016-08-19 ENCOUNTER — Encounter: Payer: Self-pay | Admitting: Family Medicine

## 2016-08-19 LAB — CBC WITH DIFFERENTIAL/PLATELET
Basophils Absolute: 0 10*3/uL (ref 0.0–0.2)
Basos: 0 %
EOS (ABSOLUTE): 0.2 10*3/uL (ref 0.0–0.4)
EOS: 2 %
HEMATOCRIT: 40.9 % (ref 37.5–51.0)
HEMOGLOBIN: 13.4 g/dL (ref 13.0–17.7)
IMMATURE GRANULOCYTES: 0 %
Immature Grans (Abs): 0 10*3/uL (ref 0.0–0.1)
Lymphocytes Absolute: 1.3 10*3/uL (ref 0.7–3.1)
Lymphs: 16 %
MCH: 29.7 pg (ref 26.6–33.0)
MCHC: 32.8 g/dL (ref 31.5–35.7)
MCV: 91 fL (ref 79–97)
MONOS ABS: 0.9 10*3/uL (ref 0.1–0.9)
Monocytes: 12 %
NEUTROS PCT: 70 %
Neutrophils Absolute: 5.6 10*3/uL (ref 1.4–7.0)
Platelets: 298 10*3/uL (ref 150–379)
RBC: 4.51 x10E6/uL (ref 4.14–5.80)
RDW: 15 % (ref 12.3–15.4)
WBC: 8 10*3/uL (ref 3.4–10.8)

## 2016-08-19 LAB — COMPREHENSIVE METABOLIC PANEL
A/G RATIO: 1.6 (ref 1.2–2.2)
ALBUMIN: 4.5 g/dL (ref 3.6–4.8)
ALT: 10 IU/L (ref 0–44)
AST: 15 IU/L (ref 0–40)
Alkaline Phosphatase: 69 IU/L (ref 39–117)
BUN / CREAT RATIO: 12 (ref 10–24)
BUN: 13 mg/dL (ref 8–27)
Bilirubin Total: 0.3 mg/dL (ref 0.0–1.2)
CALCIUM: 9.5 mg/dL (ref 8.6–10.2)
CO2: 21 mmol/L (ref 18–29)
CREATININE: 1.09 mg/dL (ref 0.76–1.27)
Chloride: 101 mmol/L (ref 96–106)
GFR, EST AFRICAN AMERICAN: 80 mL/min/{1.73_m2} (ref >59)
GFR, EST NON AFRICAN AMERICAN: 69 mL/min/{1.73_m2} (ref >59)
GLOBULIN, TOTAL: 2.8 g/dL (ref 1.5–4.5)
Glucose: 136 mg/dL — ABNORMAL HIGH (ref 65–99)
POTASSIUM: 4 mmol/L (ref 3.5–5.2)
SODIUM: 137 mmol/L (ref 134–144)
TOTAL PROTEIN: 7.3 g/dL (ref 6.0–8.5)

## 2016-08-19 LAB — LIPID PANEL W/O CHOL/HDL RATIO
Cholesterol, Total: 157 mg/dL (ref 100–199)
HDL: 46 mg/dL (ref >39)
LDL CALC: 101 mg/dL — AB (ref 0–99)
Triglycerides: 51 mg/dL (ref 0–149)
VLDL CHOLESTEROL CAL: 10 mg/dL (ref 5–40)

## 2016-08-19 LAB — MICROALBUMIN / CREATININE URINE RATIO
CREATININE, UR: 281.6 mg/dL
MICROALB/CREAT RATIO: 3.6 mg/g{creat} (ref 0.0–30.0)
MICROALBUM., U, RANDOM: 10 ug/mL

## 2016-08-19 LAB — PSA: Prostate Specific Ag, Serum: 0.9 ng/mL (ref 0.0–4.0)

## 2016-08-19 LAB — TSH: TSH: 0.921 u[IU]/mL (ref 0.450–4.500)

## 2016-08-19 LAB — HCV RNA QUANT: Hepatitis C Quantitation: NOT DETECTED IU/mL

## 2016-08-27 ENCOUNTER — Ambulatory Visit
Admission: RE | Admit: 2016-08-27 | Discharge: 2016-08-27 | Disposition: A | Discharge status: Home / Self Care | Payer: Medicare Other | Source: Ambulatory Visit | Attending: Oncology | Admitting: Oncology

## 2016-08-27 DIAGNOSIS — Z122 Encounter for screening for malignant neoplasm of respiratory organs: Secondary | ICD-10-CM | POA: Diagnosis not present

## 2016-08-27 DIAGNOSIS — I2510 Atherosclerotic heart disease of native coronary artery without angina pectoris: Secondary | ICD-10-CM | POA: Diagnosis not present

## 2016-08-27 DIAGNOSIS — Z87891 Personal history of nicotine dependence: Secondary | ICD-10-CM | POA: Diagnosis not present

## 2016-08-27 DIAGNOSIS — I700 Atherosclerosis of aorta: Secondary | ICD-10-CM | POA: Diagnosis not present

## 2016-09-03 ENCOUNTER — Encounter: Payer: Self-pay | Admitting: *Deleted

## 2016-11-11 ENCOUNTER — Other Ambulatory Visit: Payer: Self-pay | Admitting: Family Medicine

## 2017-02-16 ENCOUNTER — Ambulatory Visit (INDEPENDENT_AMBULATORY_CARE_PROVIDER_SITE_OTHER): Payer: Medicare Other | Admitting: Family Medicine

## 2017-02-16 ENCOUNTER — Encounter: Payer: Self-pay | Admitting: Family Medicine

## 2017-02-16 VITALS — BP 138/68 | HR 81 | Temp 98.3°F | Wt 218.4 lb

## 2017-02-16 DIAGNOSIS — I2510 Atherosclerotic heart disease of native coronary artery without angina pectoris: Secondary | ICD-10-CM | POA: Diagnosis not present

## 2017-02-16 DIAGNOSIS — R7301 Impaired fasting glucose: Secondary | ICD-10-CM

## 2017-02-16 DIAGNOSIS — J449 Chronic obstructive pulmonary disease, unspecified: Secondary | ICD-10-CM | POA: Diagnosis not present

## 2017-02-16 DIAGNOSIS — I10 Essential (primary) hypertension: Secondary | ICD-10-CM

## 2017-02-16 DIAGNOSIS — Z720 Tobacco use: Secondary | ICD-10-CM | POA: Diagnosis not present

## 2017-02-16 LAB — BAYER DCA HB A1C WAIVED: HB A1C: 5.5 % (ref <7.0)

## 2017-02-16 MED ORDER — VARENICLINE TARTRATE 1 MG PO TABS: 1.0000 mg | ORAL_TABLET | Freq: Two times a day (BID) | ORAL | 2 refills | Status: DC

## 2017-02-16 MED ORDER — VARENICLINE TARTRATE 0.5 MG X 11 & 1 MG X 42 PO MISC: ORAL | 0 refills | Status: DC

## 2017-02-16 MED ORDER — AMLODIPINE BESYLATE 10 MG PO TABS: 10.0000 mg | ORAL_TABLET | Freq: Every day | ORAL | 1 refills | Status: DC

## 2017-02-16 MED ORDER — BENAZEPRIL HCL 40 MG PO TABS: 40.0000 mg | ORAL_TABLET | Freq: Every day | ORAL | 1 refills | Status: DC

## 2017-02-16 MED ORDER — TRIAMCINOLONE ACETONIDE 0.5 % EX OINT: 1.0000 "application " | TOPICAL_OINTMENT | Freq: Two times a day (BID) | CUTANEOUS | 0 refills | Status: DC

## 2017-02-16 NOTE — Assessment & Plan Note (Signed)
Better on recheck. Will continue current regimen. Continue to monitor. Call with any concerns. Refills given today.

## 2017-02-16 NOTE — Assessment & Plan Note (Signed)
Will continue to keep BP and cholesterol under good control.

## 2017-02-16 NOTE — Assessment & Plan Note (Signed)
Stable without symptoms. Will work on quitting smoking. Call with any concerns.

## 2017-02-16 NOTE — Progress Notes (Signed)
BP 138/68   Pulse 81   Temp 98.3 F (36.8 C)   Wt 218 lb 7 oz (99.1 kg)   SpO2 97%   BMI 30.47 kg/m    Subjective:    Patient ID: Nathan Cooper, male    DOB: March 11, 1946, 71 y.o.   MRN: [id number]  HPI: Nathan Cooper is a 71 y.o. male  Chief Complaint  Patient presents with  . Hypertension  . Nicotine Dependence    Refill on Chantix  . Medication Refill    Triamcinolone ointment   HYPERTENSION Hypertension status: stable  Satisfied with current treatment? yes Duration of hypertension: chronic BP monitoring frequency:  not checking BP medication side effects:  no Medication compliance: excellent compliance Previous BP meds: amlodipine, benazepril  Aspirin: yes Recurrent headaches: no Visual changes: no Palpitations: no Dyspnea: no Chest pain: no Lower extremity edema: no Dizzy/lightheaded: no  Impaired Fasting Glucose HbA1C:  Lab Results  Component Value Date   HGBA1C 6.1 (H) 07/17/2015   Duration of elevated blood sugar: chronic Polydipsia: no Polyuria: no Weight change: no Visual disturbance: no Glucose Monitoring: no  COPD COPD status: controlled Satisfied with current treatment?: yes Oxygen use: no Dyspnea frequency:  Cough frequency:  Rescue inhaler frequency:   Limitation of activity: no Productive cough:  Pneumovax: Up to Date Influenza: Up to Date  SMOKING CESSATION Smoking Status: current every day smoker Smoking Amount: 1ppd Smoking Onset: restarted about 4 months ago Smoking Quit Date: Not set Smoking triggers: going to the group home, after eating  Type of tobacco use: cigarettes Children in the house: no Other household members who smoke: no Treatments attempted: chantix  Pneumovax: up to date   Relevant past medical, surgical, family and social history reviewed and updated as indicated. Interim medical history since our last visit reviewed. Allergies and medications reviewed and updated.  Review of Systems    Constitutional: Negative.   Respiratory: Negative.   Cardiovascular: Negative.   Psychiatric/Behavioral: Negative.     Per HPI unless specifically indicated above     Objective:    BP 138/68   Pulse 81   Temp 98.3 F (36.8 C)   Wt 218 lb 7 oz (99.1 kg)   SpO2 97%   BMI 30.47 kg/m   Wt Readings from Last 3 Encounters:  02/16/17 218 lb 7 oz (99.1 kg)  08/27/16 218 lb (98.9 kg)  08/17/16 219 lb (99.3 kg)    Physical Exam  Constitutional: He is oriented to person, place, and time. He appears well-developed and well-nourished. No distress.  HENT:  Head: Normocephalic and atraumatic.  Right Ear: Hearing normal.  Left Ear: Hearing normal.  Nose: Nose normal.  Eyes: Conjunctivae and lids are normal. Right eye exhibits no discharge. Left eye exhibits no discharge. No scleral icterus.  Cardiovascular: Normal rate, regular rhythm, normal heart sounds and intact distal pulses.  Exam reveals no gallop and no friction rub.   No murmur heard. Pulmonary/Chest: Effort normal and breath sounds normal. No respiratory distress. He has no wheezes. He has no rales. He exhibits no tenderness.  Musculoskeletal: Normal range of motion.  Neurological: He is alert and oriented to person, place, and time.  Skin: Skin is warm, dry and intact. No rash noted. He is not diaphoretic. No erythema. No pallor.  Psychiatric: He has a normal mood and affect. His speech is normal and behavior is normal. Judgment and thought content normal. Cognition and memory are normal.  Nursing note and vitals reviewed.  Results for orders placed or performed in visit on 08/17/16  Microscopic Examination  Result Value Ref Range   WBC, UA None seen 0 - 5 /hpf   RBC, UA None seen 0 - 2 /hpf   Epithelial Cells (non renal) None seen 0 - 10 /hpf   Bacteria, UA None seen None seen/Few  Bayer DCA Hb A1c Waived  Result Value Ref Range   Bayer DCA Hb A1c Waived 6.2 <7.0 %  CBC with Differential/Platelet  Result Value Ref  Range   WBC 8.0 3.4 - 10.8 x10E3/uL   RBC 4.51 4.14 - 5.80 x10E6/uL   Hemoglobin 13.4 13.0 - 17.7 g/dL   Hematocrit 40.9 37.5 - 51.0 %   MCV 91 79 - 97 fL   MCH 29.7 26.6 - 33.0 pg   MCHC 32.8 31.5 - 35.7 g/dL   RDW 15.0 12.3 - 15.4 %   Platelets 298 150 - 379 x10E3/uL   Neutrophils 70 Not Estab. %   Lymphs 16 Not Estab. %   Monocytes 12 Not Estab. %   Eos 2 Not Estab. %   Basos 0 Not Estab. %   Neutrophils Absolute 5.6 1.4 - 7.0 x10E3/uL   Lymphocytes Absolute 1.3 0.7 - 3.1 x10E3/uL   Monocytes Absolute 0.9 0.1 - 0.9 x10E3/uL   EOS (ABSOLUTE) 0.2 0.0 - 0.4 x10E3/uL   Basophils Absolute 0.0 0.0 - 0.2 x10E3/uL   Immature Granulocytes 0 Not Estab. %   Immature Grans (Abs) 0.0 0.0 - 0.1 x10E3/uL  Comprehensive metabolic panel  Result Value Ref Range   Glucose 136 (H) 65 - 99 mg/dL   BUN 13 8 - 27 mg/dL   Creatinine, Ser 1.09 0.76 - 1.27 mg/dL   GFR calc non Af Amer 69 >59 mL/min/1.73   GFR calc Af Amer 80 >59 mL/min/1.73   BUN/Creatinine Ratio 12 10 - 24   Sodium 137 134 - 144 mmol/L   Potassium 4.0 3.5 - 5.2 mmol/L   Chloride 101 96 - 106 mmol/L   CO2 21 18 - 29 mmol/L   Calcium 9.5 8.6 - 10.2 mg/dL   Total Protein 7.3 6.0 - 8.5 g/dL   Albumin 4.5 3.6 - 4.8 g/dL   Globulin, Total 2.8 1.5 - 4.5 g/dL   Albumin/Globulin Ratio 1.6 1.2 - 2.2   Bilirubin Total 0.3 0.0 - 1.2 mg/dL   Alkaline Phosphatase 69 39 - 117 IU/L   AST 15 0 - 40 IU/L   ALT 10 0 - 44 IU/L  HCV RNA quant  Result Value Ref Range   Hepatitis C Quantitation HCV Not Detected IU/mL   Test Information Comment   Lipid Panel w/o Chol/HDL Ratio  Result Value Ref Range   Cholesterol, Total 157 100 - 199 mg/dL   Triglycerides 51 0 - 149 mg/dL   HDL 46 >39 mg/dL   VLDL Cholesterol Cal 10 5 - 40 mg/dL   LDL Calculated 101 (H) 0 - 99 mg/dL  Urine Microalbumin w/creat. ratio  Result Value Ref Range   Creatinine, Urine 281.6 Not Estab. mg/dL   Albumin, Urine 10.0 Not Estab. ug/mL   Microalb/Creat Ratio 3.6 0.0 -  30.0 mg/g creat  PSA  Result Value Ref Range   Prostate Specific Ag, Serum 0.9 0.0 - 4.0 ng/mL  TSH  Result Value Ref Range   TSH 0.921 0.450 - 4.500 uIU/mL  UA/M w/rflx Culture, Routine  Result Value Ref Range   Specific Gravity, UA >1.030 (H) 1.005 - 1.030   pH,  UA 5.5 5.0 - 7.5   Color, UA Yellow Yellow   Appearance Ur Clear Clear   Leukocytes, UA Negative Negative   Protein, UA Negative Negative/Trace   Glucose, UA Negative Negative   Ketones, UA Trace (A) Negative   RBC, UA Negative Negative   Bilirubin, UA Negative Negative   Urobilinogen, Ur 1.0 0.2 - 1.0 mg/dL   Nitrite, UA Negative Negative   Microscopic Examination See below:       Assessment & Plan:   Problem List Items Addressed This Visit      Cardiovascular and Mediastinum   HTN (hypertension) - Primary    Better on recheck. Will continue current regimen. Continue to monitor. Call with any concerns. Refills given today.      Relevant Medications   benazepril (LOTENSIN) 40 MG tablet   amLODipine (NORVASC) 10 MG tablet   Other Relevant Orders   Basic metabolic panel   CAD (coronary artery disease)    Will continue to keep BP and cholesterol under good control.       Relevant Medications   benazepril (LOTENSIN) 40 MG tablet   amLODipine (NORVASC) 10 MG tablet     Respiratory   COPD (chronic obstructive pulmonary disease) (HCC)    Stable without symptoms. Will work on quitting smoking. Call with any concerns.       Relevant Medications   varenicline (CHANTIX CONTINUING MONTH PAK) 1 MG tablet   varenicline (CHANTIX STARTING MONTH PAK) 0.5 MG X 11 & 1 MG X 42 tablet     Endocrine   IFG (impaired fasting glucose)    Under good control with A1c of 5.5. Continue diet and exercise. Recheck 6 months.       Relevant Orders   Bayer DCA Hb A1c Waived     Other   Tobacco abuse    Started smoking again. Will restart chantix. Consider wellbutrin when he finishes chantix long term to help avoid cravings.             Follow up plan: Return in about 6 months (around 08/19/2017) for Physical.

## 2017-02-16 NOTE — Assessment & Plan Note (Signed)
Under good control with A1c of 5.5. Continue diet and exercise. Recheck 6 months.

## 2017-02-16 NOTE — Assessment & Plan Note (Signed)
Started smoking again. Will restart chantix. Consider wellbutrin when he finishes chantix long term to help avoid cravings.

## 2017-02-16 NOTE — Patient Instructions (Addendum)
Steps to Quit Smoking Smoking tobacco can be harmful to your health and can affect almost every organ in your body. Smoking puts you, and those around you, at risk for developing many serious chronic diseases. Quitting smoking is difficult, but it is one of the best things that you can do for your health. It is never too late to quit. What are the benefits of quitting smoking? When you quit smoking, you lower your risk of developing serious diseases and conditions, such as:  Lung cancer or lung disease, such as COPD.  Heart disease.  Stroke.  Heart attack.  Infertility.  Osteoporosis and bone fractures.  Additionally, symptoms such as coughing, wheezing, and shortness of breath may get better when you quit. You may also find that you get sick less often because your body is stronger at fighting off colds and infections. If you are pregnant, quitting smoking can help to reduce your chances of having a baby of low birth weight. How do I get ready to quit? When you decide to quit smoking, create a plan to make sure that you are successful. Before you quit:  Pick a date to quit. Set a date within the next two weeks to give you time to prepare.  Write down the reasons why you are quitting. Keep this list in places where you will see it often, such as on your bathroom mirror or in your car or wallet.  Identify the people, places, things, and activities that make you want to smoke (triggers) and avoid them. Make sure to take these actions: ? Throw away all cigarettes at home, at work, and in your car. ? Throw away smoking accessories, such as ashtrays and lighters. ? Clean your car and make sure to empty the ashtray. ? Clean your home, including curtains and carpets.  Tell your family, friends, and coworkers that you are quitting. Support from your loved ones can make quitting easier.  Talk with your health care provider about your options for quitting smoking.  Find out what treatment  options are covered by your health insurance.  What strategies can I use to quit smoking? Talk with your healthcare provider about different strategies to quit smoking. Some strategies include:  Quitting smoking altogether instead of gradually lessening how much you smoke over a period of time. Research shows that quitting "cold turkey" is more successful than gradually quitting.  Attending in-person counseling to help you build problem-solving skills. You are more likely to have success in quitting if you attend several counseling sessions. Even short sessions of 10 minutes can be effective.  Finding resources and support systems that can help you to quit smoking and remain smoke-free after you quit. These resources are most helpful when you use them often. They can include: ? Online chats with a counselor. ? Telephone quitlines. ? Printed self-help materials. ? Support groups or group counseling. ? Text messaging programs. ? Mobile phone applications.  Taking medicines to help you quit smoking. (If you are pregnant or breastfeeding, talk with your health care provider first.) Some medicines contain nicotine and some do not. Both types of medicines help with cravings, but the medicines that include nicotine help to relieve withdrawal symptoms. Your health care provider may recommend: ? Nicotine patches, gum, or lozenges. ? Nicotine inhalers or sprays. ? Non-nicotine medicine that is taken by mouth.  Talk with your health care provider about combining strategies, such as taking medicines while you are also receiving in-person counseling. Using these two strategies together   makes you more likely to succeed in quitting than if you used either strategy on its own. If you are pregnant or breastfeeding, talk with your health care provider about finding counseling or other support strategies to quit smoking. Do not take medicine to help you quit smoking unless told to do so by your health care  provider. What things can I do to make it easier to quit? Quitting smoking might feel overwhelming at first, but there is a lot that you can do to make it easier. Take these important actions:  Reach out to your family and friends and ask that they support and encourage you during this time. Call telephone quitlines, reach out to support groups, or work with a counselor for support.  Ask people who smoke to avoid smoking around you.  Avoid places that trigger you to smoke, such as bars, parties, or smoke-break areas at work.  Spend time around people who do not smoke.  Lessen stress in your life, because stress can be a smoking trigger for some people. To lessen stress, try: ? Exercising regularly. ? Deep-breathing exercises. ? Yoga. ? Meditating. ? Performing a body scan. This involves closing your eyes, scanning your body from head to toe, and noticing which parts of your body are particularly tense. Purposefully relax the muscles in those areas.  Download or purchase mobile phone or tablet apps (applications) that can help you stick to your quit plan by providing reminders, tips, and encouragement. There are many free apps, such as QuitGuide from the CDC (Centers for Disease Control and Prevention). You can find other support for quitting smoking (smoking cessation) through smokefree.gov and other websites.  How will I feel when I quit smoking? Within the first 24 hours of quitting smoking, you may start to feel some withdrawal symptoms. These symptoms are usually most noticeable 2-3 days after quitting, but they usually do not last beyond 2-3 weeks. Changes or symptoms that you might experience include:  Mood swings.  Restlessness, anxiety, or irritation.  Difficulty concentrating.  Dizziness.  Strong cravings for sugary foods in addition to nicotine.  Mild weight gain.  Constipation.  Nausea.  Coughing or a sore throat.  Changes in how your medicines work in your  body.  A depressed mood.  Difficulty sleeping (insomnia).  After the first 2-3 weeks of quitting, you may start to notice more positive results, such as:  Improved sense of smell and taste.  Decreased coughing and sore throat.  Slower heart rate.  Lower blood pressure.  Clearer skin.  The ability to breathe more easily.  Fewer sick days.  Quitting smoking is very challenging for most people. Do not get discouraged if you are not successful the first time. Some people need to make many attempts to quit before they achieve long-term success. Do your best to stick to your quit plan, and talk with your health care provider if you have any questions or concerns. This information is not intended to replace advice given to you by your health care provider. Make sure you discuss any questions you have with your health care provider. Document Released: 06/29/2001 Document Revised: 03/02/2016 Document Reviewed: 11/19/2014 Elsevier Interactive Patient Education  2017 Elsevier Inc.  

## 2017-02-17 LAB — BASIC METABOLIC PANEL
BUN / CREAT RATIO: 13 (ref 10–24)
BUN: 14 mg/dL (ref 8–27)
CALCIUM: 9.3 mg/dL (ref 8.6–10.2)
CO2: 20 mmol/L (ref 20–29)
Chloride: 103 mmol/L (ref 96–106)
Creatinine, Ser: 1.07 mg/dL (ref 0.76–1.27)
GFR calc Af Amer: 81 mL/min/{1.73_m2} (ref >59)
GFR, EST NON AFRICAN AMERICAN: 70 mL/min/{1.73_m2} (ref >59)
Glucose: 80 mg/dL (ref 65–99)
POTASSIUM: 4.4 mmol/L (ref 3.5–5.2)
SODIUM: 140 mmol/L (ref 134–144)

## 2017-03-01 ENCOUNTER — Telehealth: Payer: Self-pay | Admitting: Family Medicine

## 2017-03-01 DIAGNOSIS — G4733 Obstructive sleep apnea (adult) (pediatric): Secondary | ICD-10-CM

## 2017-03-01 DIAGNOSIS — Z9989 Dependence on other enabling machines and devices: Principal | ICD-10-CM

## 2017-03-01 NOTE — Telephone Encounter (Signed)
If he doesn't have the settings, he will need another sleep study.

## 2017-03-01 NOTE — Telephone Encounter (Signed)
Routing to provider  

## 2017-03-01 NOTE — Telephone Encounter (Signed)
Patient states that he does not have the settings and that his sleep study was about 10 years ago, what should he do?

## 2017-03-01 NOTE — Telephone Encounter (Signed)
I will write it no problem, I just need to know the settings he's been keeping it at.

## 2017-03-01 NOTE — Telephone Encounter (Signed)
Called and left a message letting the patient know that we need his sleep settings.

## 2017-03-01 NOTE — Telephone Encounter (Signed)
Patient returned call to [place]. Informed patient will notify CMA.

## 2017-03-02 NOTE — Telephone Encounter (Signed)
routing

## 2017-03-02 NOTE — Telephone Encounter (Signed)
Referral generated

## 2017-03-02 NOTE — Telephone Encounter (Signed)
Patient would like to go ahead and do another sleep study.

## 2017-03-07 ENCOUNTER — Telehealth: Payer: Self-pay | Admitting: Family Medicine

## 2017-03-07 DIAGNOSIS — N529 Male erectile dysfunction, unspecified: Secondary | ICD-10-CM

## 2017-03-07 MED ORDER — SILDENAFIL CITRATE 20 MG PO TABS: ORAL_TABLET | ORAL | 12 refills | Status: DC

## 2017-03-07 NOTE — Telephone Encounter (Signed)
Routing to provider  

## 2017-03-07 NOTE — Telephone Encounter (Signed)
Rx printed, OK to fax to [facility] at [employer]

## 2017-03-07 NOTE — Telephone Encounter (Signed)
Patient would like for you to send a script to [place] for viagra -possible generic form attn Dian Situ as he has spoken with Dian Situ regarding this script.  Thank you  [phone] Jorian if you need to reach him.

## 2017-03-09 DIAGNOSIS — G4733 Obstructive sleep apnea (adult) (pediatric): Secondary | ICD-10-CM | POA: Diagnosis not present

## 2017-03-15 DIAGNOSIS — G4733 Obstructive sleep apnea (adult) (pediatric): Secondary | ICD-10-CM | POA: Diagnosis not present

## 2017-03-17 ENCOUNTER — Telehealth: Payer: Self-pay

## 2017-03-17 DIAGNOSIS — I2510 Atherosclerotic heart disease of native coronary artery without angina pectoris: Secondary | ICD-10-CM

## 2017-03-17 NOTE — Telephone Encounter (Signed)
Received a sleep study report on patient that recommends that the patient goes to see cardiology.  Patient was referred to cardiology, unsure if patient went since there is not any notes in the chart.  Will call to verify that patient went to cardiology.

## 2017-03-17 NOTE — Telephone Encounter (Signed)
Called patient, no answer, left a voicemail for patient to return my call.   

## 2017-03-18 NOTE — Telephone Encounter (Signed)
Called patient, no answer, left message for patient to return my call.

## 2017-03-22 ENCOUNTER — Encounter: Payer: Self-pay | Admitting: Family Medicine

## 2017-03-22 NOTE — Telephone Encounter (Signed)
Letter generated

## 2017-03-22 NOTE — Telephone Encounter (Signed)
Referral generated

## 2017-03-22 NOTE — Telephone Encounter (Signed)
Pt called and would like a call back.

## 2017-03-22 NOTE — Telephone Encounter (Signed)
Called patient, no answer, left a message for patient to return my call.   Dr.Johsnon, I have not been able to reach this patient after 3 attempts, what would you like to do?

## 2017-03-22 NOTE — Telephone Encounter (Signed)
Please go ahead and put the referral in for Cardiology.

## 2017-05-24 DIAGNOSIS — Z23 Encounter for immunization: Secondary | ICD-10-CM | POA: Diagnosis not present

## 2017-06-23 ENCOUNTER — Telehealth: Payer: Self-pay | Admitting: Family Medicine

## 2017-06-23 NOTE — Telephone Encounter (Signed)
Copied from [place]. Topic: Inquiry >> Jun 23, 2017 10:23 AM Neva Seat wrote:   [facility] -  Fax [phone] /  Phone - [phone]  Needs results of pt's sleep study to give pt the equipment needed.  Please fax results

## 2017-06-23 NOTE — Telephone Encounter (Signed)
Sleep Study report was printed from record and faxed to [facility] -  Fax - [phone]

## 2017-06-30 NOTE — Telephone Encounter (Signed)
Spoke with Nathan Cooper, patient is aware that he will need to come in to discuss his need for a C-pap replacement. Patient will call to schedule an appointment if he wants to go ahead with this.

## 2017-06-30 NOTE — Telephone Encounter (Signed)
Patient states Nathan Cooper from [place] needs to speak with someone regarding this patients CPAP information.  [place]

## 2017-07-13 ENCOUNTER — Telehealth: Payer: Self-pay

## 2017-07-13 NOTE — Telephone Encounter (Signed)
Called patient to schedule medicare wellness visit. Patient inquired about his CPAP machine. Stated he hasnt heard anything back since his insurance denied it. Informed patient per Ivin Booty at [facility] patient would needs an office visit following the sleep study to completed the order. Informed patient. Will check with PCP to see if patient needs to come in for this or if a note can be placed in regards to the sleep study. Appointment was made, will call patient back prior to appointment to confirm if he still needs to come in.

## 2017-07-14 NOTE — Telephone Encounter (Signed)
Does not need to be seen. Please send copy of sleep study (03/16/17). Has known OSA- per Sleep study. Needs CPAP titrated as discussed.

## 2017-07-22 ENCOUNTER — Ambulatory Visit (INDEPENDENT_AMBULATORY_CARE_PROVIDER_SITE_OTHER): Payer: Medicare Other | Admitting: Family Medicine

## 2017-07-22 ENCOUNTER — Encounter: Payer: Self-pay | Admitting: Family Medicine

## 2017-07-22 VITALS — BP 132/80 | HR 96 | Wt 210.4 lb

## 2017-07-22 DIAGNOSIS — R008 Other abnormalities of heart beat: Secondary | ICD-10-CM | POA: Diagnosis not present

## 2017-07-22 DIAGNOSIS — Z9989 Dependence on other enabling machines and devices: Secondary | ICD-10-CM | POA: Diagnosis not present

## 2017-07-22 DIAGNOSIS — G4733 Obstructive sleep apnea (adult) (pediatric): Secondary | ICD-10-CM

## 2017-07-22 MED ORDER — TRIAMCINOLONE ACETONIDE 0.5 % EX OINT: 1.0000 "application " | TOPICAL_OINTMENT | Freq: Two times a day (BID) | CUTANEOUS | 3 refills | Status: DC

## 2017-07-22 NOTE — Assessment & Plan Note (Signed)
Not on his CPAP right now given issues with insurance. Will call and find out what's going on.

## 2017-07-22 NOTE — Progress Notes (Signed)
BP 132/80 (BP Location: Left Arm, Cuff Size: Normal)   Pulse 96   Wt 210 lb 6 oz (95.4 kg)   SpO2 96%   BMI 29.34 kg/m    Subjective:    Patient ID: Nathan Cooper, male    DOB: [birth year], 72 y.o.   MRN: [id number]  HPI: Nathan Cooper is a 72 y.o. male  Chief Complaint  Patient presents with  . Sleep Apnea  . Rash    Refill on Triamcinolone ointment   SLEEP APNEA- has not had his CPAP since August. He has been getting a run around with insurance and has been having problems getting his CPAP. Needs it set up. Otherwise doing well. Had some trigemeny on sleep study. Needs to see cardiology Sleep apnea status: uncontrolled Duration: chronic Satisfied with current treatment?:  no- has not had his CPAP since August CPAP use:  no Sleep quality with CPAP use: No CPAP since August Treament compliance:poor compliance Last sleep study: August Wakes feeling refreshed:  yes Daytime hypersomnolence:  yes Fatigue:  yes Insomnia:  no Good sleep hygiene:  yes Difficulty falling asleep:  no Difficulty staying asleep:  no Snoring bothers bed partner:  no Observed apnea by bed partner: yes Obesity:  yes Hypertension: yes  Pulmonary hypertension:  no Coronary artery disease:  yes  Relevant past medical, surgical, family and social history reviewed and updated as indicated. Interim medical history since our last visit reviewed. Allergies and medications reviewed and updated.  Review of Systems  Constitutional: Negative.   Respiratory: Negative.   Cardiovascular: Negative.   Musculoskeletal: Negative.   Neurological: Negative.   Psychiatric/Behavioral: Negative.     Per HPI unless specifically indicated above     Objective:    BP 132/80 (BP Location: Left Arm, Cuff Size: Normal)   Pulse 96   Wt 210 lb 6 oz (95.4 kg)   SpO2 96%   BMI 29.34 kg/m   Wt Readings from Last 3 Encounters:  07/22/17 210 lb 6 oz (95.4 kg)  02/16/17 218 lb 7 oz (99.1 kg)  08/27/16 218  lb (98.9 kg)    Physical Exam  Constitutional: He is oriented to person, place, and time. He appears well-developed and well-nourished. No distress.  HENT:  Head: Normocephalic and atraumatic.  Right Ear: Hearing normal.  Left Ear: Hearing normal.  Nose: Nose normal.  Eyes: Conjunctivae and lids are normal. Right eye exhibits no discharge. Left eye exhibits no discharge. No scleral icterus.  Cardiovascular: Normal rate, regular rhythm, normal heart sounds and intact distal pulses. Exam reveals no gallop and no friction rub.  No murmur heard. Pulmonary/Chest: Effort normal and breath sounds normal. No respiratory distress. He has no wheezes. He has no rales. He exhibits no tenderness.  Musculoskeletal: Normal range of motion.  Neurological: He is alert and oriented to person, place, and time.  Skin: Skin is warm, dry and intact. No rash noted. He is not diaphoretic. No erythema. No pallor.  Psychiatric: He has a normal mood and affect. His speech is normal and behavior is normal. Judgment and thought content normal. Cognition and memory are normal.  Nursing note and vitals reviewed.   Results for orders placed or performed in visit on 02/16/17  Bayer DCA Hb A1c Waived  Result Value Ref Range   Bayer DCA Hb A1c Waived 5.5 <3.1 %  Basic metabolic panel  Result Value Ref Range   Glucose 80 65 - 99 mg/dL   BUN 14 8 - 27  mg/dL   Creatinine, Ser 1.07 0.76 - 1.27 mg/dL   GFR calc non Af Amer 70 >59 mL/min/1.73   GFR calc Af Amer 81 >59 mL/min/1.73   BUN/Creatinine Ratio 13 10 - 24   Sodium 140 134 - 144 mmol/L   Potassium 4.4 3.5 - 5.2 mmol/L   Chloride 103 96 - 106 mmol/L   CO2 20 20 - 29 mmol/L   Calcium 9.3 8.6 - 10.2 mg/dL      Assessment & Plan:   Problem List Items Addressed This Visit      Respiratory   OSA on CPAP - Primary    Not on his CPAP right now given issues with insurance. Will call and find out what's going on.        Other Visit Diagnoses    Trigeminy         On sleep study. Will get him into see cardiologist. Call with any concerns. Referral generated.    Relevant Orders   Ambulatory referral to Cardiology       Follow up plan: Return As scheduled.

## 2017-07-28 ENCOUNTER — Telehealth: Payer: Self-pay | Admitting: Family Medicine

## 2017-07-28 NOTE — Telephone Encounter (Signed)
Please fax info for CPAP. Thanks!

## 2017-07-28 NOTE — Telephone Encounter (Signed)
Copied from [place]. Topic: Quick Communication - See Telephone Encounter >> Jul 28, 2017  3:35 PM Ether Griffins B wrote: CRM for notification. See Telephone encounter for:  Pt calling in to let Dr. Durenda Age nurse know that [facility] is in his network for the cpap equipment  07/28/17.

## 2017-08-16 DIAGNOSIS — G4733 Obstructive sleep apnea (adult) (pediatric): Secondary | ICD-10-CM | POA: Diagnosis not present

## 2017-08-24 ENCOUNTER — Ambulatory Visit (INDEPENDENT_AMBULATORY_CARE_PROVIDER_SITE_OTHER): Payer: Medicare Other | Admitting: Family Medicine

## 2017-08-24 ENCOUNTER — Telehealth: Payer: Self-pay | Admitting: *Deleted

## 2017-08-24 ENCOUNTER — Ambulatory Visit (INDEPENDENT_AMBULATORY_CARE_PROVIDER_SITE_OTHER): Payer: Medicare Other

## 2017-08-24 ENCOUNTER — Encounter: Payer: Self-pay | Admitting: Family Medicine

## 2017-08-24 VITALS — BP 124/70 | HR 82 | Temp 97.7°F | Resp 16 | Ht 71.0 in | Wt 211.8 lb

## 2017-08-24 DIAGNOSIS — I10 Essential (primary) hypertension: Secondary | ICD-10-CM | POA: Diagnosis not present

## 2017-08-24 DIAGNOSIS — Z0001 Encounter for general adult medical examination with abnormal findings: Secondary | ICD-10-CM

## 2017-08-24 DIAGNOSIS — G4733 Obstructive sleep apnea (adult) (pediatric): Secondary | ICD-10-CM

## 2017-08-24 DIAGNOSIS — Z9989 Dependence on other enabling machines and devices: Secondary | ICD-10-CM

## 2017-08-24 DIAGNOSIS — Z0000 Encounter for general adult medical examination without abnormal findings: Secondary | ICD-10-CM

## 2017-08-24 DIAGNOSIS — B182 Chronic viral hepatitis C: Secondary | ICD-10-CM | POA: Diagnosis not present

## 2017-08-24 DIAGNOSIS — Z720 Tobacco use: Secondary | ICD-10-CM

## 2017-08-24 DIAGNOSIS — I700 Atherosclerosis of aorta: Secondary | ICD-10-CM

## 2017-08-24 DIAGNOSIS — Z122 Encounter for screening for malignant neoplasm of respiratory organs: Secondary | ICD-10-CM

## 2017-08-24 DIAGNOSIS — J449 Chronic obstructive pulmonary disease, unspecified: Secondary | ICD-10-CM

## 2017-08-24 DIAGNOSIS — I2510 Atherosclerotic heart disease of native coronary artery without angina pectoris: Secondary | ICD-10-CM

## 2017-08-24 DIAGNOSIS — R7301 Impaired fasting glucose: Secondary | ICD-10-CM

## 2017-08-24 DIAGNOSIS — N529 Male erectile dysfunction, unspecified: Secondary | ICD-10-CM

## 2017-08-24 DIAGNOSIS — Z87891 Personal history of nicotine dependence: Secondary | ICD-10-CM

## 2017-08-24 DIAGNOSIS — Z1211 Encounter for screening for malignant neoplasm of colon: Secondary | ICD-10-CM

## 2017-08-24 DIAGNOSIS — R3911 Hesitancy of micturition: Secondary | ICD-10-CM

## 2017-08-24 LAB — UA/M W/RFLX CULTURE, ROUTINE
Bilirubin, UA: NEGATIVE
Glucose, UA: NEGATIVE
LEUKOCYTES UA: NEGATIVE
NITRITE UA: NEGATIVE
PH UA: 5.5 (ref 5.0–7.5)
Protein, UA: NEGATIVE
RBC UA: NEGATIVE
SPEC GRAV UA: 1.025 (ref 1.005–1.030)
UUROB: 1 mg/dL (ref 0.2–1.0)

## 2017-08-24 LAB — CBC WITH DIFFERENTIAL/PLATELET
Hematocrit: 39.1 % (ref 37.5–51.0)
Hemoglobin: 14.2 g/dL (ref 13.0–17.7)
Lymphocytes Absolute: 1.3 10*3/uL (ref 0.7–3.1)
Lymphs: 18 %
MCH: 33.1 pg — AB (ref 26.6–33.0)
MCHC: 36.3 g/dL — AB (ref 31.5–35.7)
MCV: 91 fL (ref 79–97)
MID (Absolute): 1.1 10*3/uL (ref 0.1–1.6)
MID: 15 %
NEUTROS PCT: 67 %
Neutrophils Absolute: 5.2 10*3/uL (ref 1.4–7.0)
PLATELETS: 293 10*3/uL (ref 150–379)
RBC: 4.29 x10E6/uL (ref 4.14–5.80)
RDW: 13.2 % (ref 12.3–15.4)
WBC: 7.6 10*3/uL (ref 3.4–10.8)

## 2017-08-24 LAB — MICROALBUMIN, URINE WAIVED
Creatinine, Urine Waived: 300 mg/dL (ref 10–300)
Microalb, Ur Waived: 30 mg/L — ABNORMAL HIGH (ref 0–19)

## 2017-08-24 LAB — BAYER DCA HB A1C WAIVED: HB A1C (BAYER DCA - WAIVED): 5.4 % (ref <7.0)

## 2017-08-24 MED ORDER — SILDENAFIL CITRATE 20 MG PO TABS: ORAL_TABLET | ORAL | 12 refills | Status: DC

## 2017-08-24 MED ORDER — BENAZEPRIL HCL 40 MG PO TABS: 40.0000 mg | ORAL_TABLET | Freq: Every day | ORAL | 1 refills | Status: DC

## 2017-08-24 MED ORDER — ASPIRIN 81 MG PO TBEC: 81.0000 mg | DELAYED_RELEASE_TABLET | Freq: Every day | ORAL | 3 refills | Status: DC

## 2017-08-24 MED ORDER — AMLODIPINE BESYLATE 10 MG PO TABS: 10.0000 mg | ORAL_TABLET | Freq: Every day | ORAL | 1 refills | Status: DC

## 2017-08-24 MED ORDER — BUPROPION HCL ER (SR) 150 MG PO TB12: ORAL_TABLET | ORAL | 6 refills | Status: DC

## 2017-08-24 NOTE — Assessment & Plan Note (Addendum)
Under good control with A1c of 5.4. Call with any concerns. Continue to monitor.

## 2017-08-24 NOTE — Assessment & Plan Note (Signed)
Under good control on current regimen. Continue current regimen. Continue to monitor. Call with any concerns.   

## 2017-08-24 NOTE — Assessment & Plan Note (Signed)
Has been treated. Will recheck viral load. Call with any concerns.

## 2017-08-24 NOTE — Patient Instructions (Addendum)
Nathan Cooper , Thank you for taking time to come for your Medicare Wellness Visit. I appreciate your ongoing commitment to your health goals. Please review the following plan we discussed and let me know if I can assist you in the future.   Screening recommendations/referrals: Colonoscopy: referral sent to GI Recommended yearly ophthalmology/optometry visit for glaucoma screening and checkup Recommended yearly dental visit for hygiene and checkup  Vaccinations: Influenza vaccine: up to date Pneumococcal vaccine: up to date  Tdap vaccine: up to date  Shingles vaccine: due, check with your insurance company for coverage   Advanced directives: Please bring a copy of your health care power of attorney and living will to the office at your convenience.  Conditions/risks identified: Smoking cessation discussed  Next appointment: Follow up in one year for your annual wellness exam  Preventive Care 70 Years and Older, Male Preventive care refers to lifestyle choices and visits with your health care provider that can promote health and wellness. What does preventive care include?  A yearly physical exam. This is also called an annual well check.  Dental exams once or twice a year.  Routine eye exams. Ask your health care provider how often you should have your eyes checked.  Personal lifestyle choices, including:  Daily care of your teeth and gums.  Regular physical activity.  Eating a healthy diet.  Avoiding tobacco and drug use.  Limiting alcohol use.  Practicing safe sex.  Taking low doses of aspirin every day.  Taking vitamin and mineral supplements as recommended by your health care provider. What happens during an annual well check? The services and screenings done by your health care provider during your annual well check will depend on your age, overall health, lifestyle risk factors, and family history of disease. Counseling  Your health care provider may ask you  questions about your:  Alcohol use.  Tobacco use.  Drug use.  Emotional well-being.  Home and relationship well-being.  Sexual activity.  Eating habits.  History of falls.  Memory and ability to understand (cognition).  Work and work Statistician. Screening  You may have the following tests or measurements:  Height, weight, and BMI.  Blood pressure.  Lipid and cholesterol levels. These may be checked every 5 years, or more frequently if you are over 40 years old.  Skin check.  Lung cancer screening. You may have this screening every year starting at age 70 if you have a 30-pack-year history of smoking and currently smoke or have quit within the past 15 years.  Fecal occult blood test (FOBT) of the stool. You may have this test every year starting at age 70.  Flexible sigmoidoscopy or colonoscopy. You may have a sigmoidoscopy every 5 years or a colonoscopy every 10 years starting at age 70.  Prostate cancer screening. Recommendations will vary depending on your family history and other risks.  Hepatitis C blood test.  Hepatitis B blood test.  Sexually transmitted disease (STD) testing.  Diabetes screening. This is done by checking your blood sugar (glucose) after you have not eaten for a while (fasting). You may have this done every 1-3 years.  Abdominal aortic aneurysm (AAA) screening. You may need this if you are a current or former smoker.  Osteoporosis. You may be screened starting at age 70 if you are at high risk. Talk with your health care provider about your test results, treatment options, and if necessary, the need for more tests. Vaccines  Your health care provider may recommend certain  vaccines, such as:  Influenza vaccine. This is recommended every year.  Tetanus, diphtheria, and acellular pertussis (Tdap, Td) vaccine. You may need a Td booster every 10 years.  Zoster vaccine. You may need this after age 70.  Pneumococcal 13-valent conjugate  (PCV13) vaccine. One dose is recommended after age 70.  Pneumococcal polysaccharide (PPSV23) vaccine. One dose is recommended after age 70. Talk to your health care provider about which screenings and vaccines you need and how often you need them. This information is not intended to replace advice given to you by your health care provider. Make sure you discuss any questions you have with your health care provider. Document Released: 08/01/2015 Document Revised: 03/24/2016 Document Reviewed: 05/06/2015 Elsevier Interactive Patient Education  2017 [place] Prevention in the Home Falls can cause injuries. They can happen to people of all ages. There are many things you can do to make your home safe and to help prevent falls. What can I do on the outside of my home?  Regularly fix the edges of walkways and driveways and fix any cracks.  Remove anything that might make you trip as you walk through a door, such as a raised step or threshold.  Trim any bushes or trees on the path to your home.  Use bright outdoor lighting.  Clear any walking paths of anything that might make someone trip, such as rocks or tools.  Regularly check to see if handrails are loose or broken. Make sure that both sides of any steps have handrails.  Any raised decks and porches should have guardrails on the edges.  Have any leaves, snow, or ice cleared regularly.  Use sand or salt on walking paths during winter.  Clean up any spills in your garage right away. This includes oil or grease spills. What can I do in the bathroom?  Use night lights.  Install grab bars by the toilet and in the tub and shower. Do not use towel bars as grab bars.  Use non-skid mats or decals in the tub or shower.  If you need to sit down in the shower, use a plastic, non-slip stool.  Keep the floor dry. Clean up any water that spills on the floor as soon as it happens.  Remove soap buildup in the tub or shower  regularly.  Attach bath mats securely with double-sided non-slip rug tape.  Do not have throw rugs and other things on the floor that can make you trip. What can I do in the bedroom?  Use night lights.  Make sure that you have a light by your bed that is easy to reach.  Do not use any sheets or blankets that are too big for your bed. They should not hang down onto the floor.  Have a firm chair that has side arms. You can use this for support while you get dressed.  Do not have throw rugs and other things on the floor that can make you trip. What can I do in the kitchen?  Clean up any spills right away.  Avoid walking on wet floors.  Keep items that you use a lot in easy-to-reach places.  If you need to reach something above you, use a strong step stool that has a grab bar.  Keep electrical cords out of the way.  Do not use floor polish or wax that makes floors slippery. If you must use wax, use non-skid floor wax.  Do not have throw rugs and other things  on the floor that can make you trip. What can I do with my stairs?  Do not leave any items on the stairs.  Make sure that there are handrails on both sides of the stairs and use them. Fix handrails that are broken or loose. Make sure that handrails are as long as the stairways.  Check any carpeting to make sure that it is firmly attached to the stairs. Fix any carpet that is loose or worn.  Avoid having throw rugs at the top or bottom of the stairs. If you do have throw rugs, attach them to the floor with carpet tape.  Make sure that you have a light switch at the top of the stairs and the bottom of the stairs. If you do not have them, ask someone to add them for you. What else can I do to help prevent falls?  Wear shoes that:  Do not have high heels.  Have rubber bottoms.  Are comfortable and fit you well.  Are closed at the toe. Do not wear sandals.  If you use a stepladder:  Make sure that it is fully  opened. Do not climb a closed stepladder.  Make sure that both sides of the stepladder are locked into place.  Ask someone to hold it for you, if possible.  Clearly mark and make sure that you can see:  Any grab bars or handrails.  First and last steps.  Where the edge of each step is.  Use tools that help you move around (mobility aids) if they are needed. These include:  Canes.  Walkers.  Scooters.  Crutches.  Turn on the lights when you go into a dark area. Replace any light bulbs as soon as they burn out.  Set up your furniture so you have a clear path. Avoid moving your furniture around.  If any of your floors are uneven, fix them.  If there are any pets around you, be aware of where they are.  Review your medicines with your doctor. Some medicines can make you feel dizzy. This can increase your chance of falling. Ask your doctor what other things that you can do to help prevent falls. This information is not intended to replace advice given to you by your health care provider. Make sure you discuss any questions you have with your health care provider. Document Released: 05/01/2009 Document Revised: 12/11/2015 Document Reviewed: 08/09/2014 Elsevier Interactive Patient Education  2017 [employer].  Steps to Quit Smoking Smoking tobacco can be bad for your health. It can also affect almost every organ in your body. Smoking puts you and people around you at risk for many serious long-lasting (chronic) diseases. Quitting smoking is hard, but it is one of the best things that you can do for your health. It is never too late to quit. What are the benefits of quitting smoking? When you quit smoking, you lower your risk for getting serious diseases and conditions. They can include:  Lung cancer or lung disease.  Heart disease.  Stroke.  Heart attack.  Not being able to have children (infertility).  Weak bones (osteoporosis) and broken bones (fractures).  If you  have coughing, wheezing, and shortness of breath, those symptoms may get better when you quit. You may also get sick less often. If you are pregnant, quitting smoking can help to lower your chances of having a baby of low birth weight. What can I do to help me quit smoking? Talk with your doctor about what  can help you quit smoking. Some things you can do (strategies) include:  Quitting smoking totally, instead of slowly cutting back how much you smoke over a period of time.  Going to in-person counseling. You are more likely to quit if you go to many counseling sessions.  Using resources and support systems, such as: ? Database administrator with a Social worker. ? Phone quitlines. ? Careers information officer. ? Support groups or group counseling. ? Text messaging programs. ? Mobile phone apps or applications.  Taking medicines. Some of these medicines may have nicotine in them. If you are pregnant or breastfeeding, do not take any medicines to quit smoking unless your doctor says it is okay. Talk with your doctor about counseling or other things that can help you.  Talk with your doctor about using more than one strategy at the same time, such as taking medicines while you are also going to in-person counseling. This can help make quitting easier. What things can I do to make it easier to quit? Quitting smoking might feel very hard at first, but there is a lot that you can do to make it easier. Take these steps:  Talk to your family and friends. Ask them to support and encourage you.  Call phone quitlines, reach out to support groups, or work with a Social worker.  Ask people who smoke to not smoke around you.  Avoid places that make you want (trigger) to smoke, such as: ? Bars. ? Parties. ? Smoke-break areas at work.  Spend time with people who do not smoke.  Lower the stress in your life. Stress can make you want to smoke. Try these things to help your stress: ? Getting regular  exercise. ? Deep-breathing exercises. ? Yoga. ? Meditating. ? Doing a body scan. To do this, close your eyes, focus on one area of your body at a time from head to toe, and notice which parts of your body are tense. Try to relax the muscles in those areas.  Download or buy apps on your mobile phone or tablet that can help you stick to your quit plan. There are many free apps, such as QuitGuide from the [employer] Office manager for Disease Control and Prevention). You can find more support from smokefree.gov and other websites.  This information is not intended to replace advice given to you by your health care provider. Make sure you discuss any questions you have with your health care provider. Document Released: 05/01/2009 Document Revised: 03/02/2016 Document Reviewed: 11/19/2014 Elsevier Interactive Patient Education  2018 [employer].

## 2017-08-24 NOTE — Patient Instructions (Addendum)

## 2017-08-24 NOTE — Telephone Encounter (Signed)
Notified patient that annual lung cancer screening low dose CT scan is due currently or will be in near future. Confirmed that patient is within the age range of 55-77, and asymptomatic, (no signs or symptoms of lung cancer). Patient denies illness that would prevent curative treatment for lung cancer if found. Verified smoking history, (current, 46.25 pack year). The shared decision making visit was done 08/27/15. Patient is agreeable for CT scan being scheduled.

## 2017-08-24 NOTE — Assessment & Plan Note (Signed)
Will keep BP and cholesterol and sugar under good control. Continue current regimen. Continue to monitor. Call with any concerns.

## 2017-08-24 NOTE — Assessment & Plan Note (Signed)
Back on his CPAP. Feeling much better. Continue to monitor.

## 2017-08-24 NOTE — Progress Notes (Signed)
BP 124/70 (BP Location: Left Arm, Patient Position: Sitting, Cuff Size: Normal)   Pulse 82   Temp 97.7 F (36.5 C) (Oral)   Resp 16   Ht 5\' 11"  (1.803 m)   Wt 211 lb 12.8 oz (96.1 kg)   BMI 29.54 kg/m    Subjective:    Patient ID: Nathan Cooper, male    DOB: 1946-04-22, 72 y.o.   MRN: [id number]  HPI: Nathan Cooper is a 72 y.o. male presenting on 08/24/2017 for comprehensive medical examination. Current medical complaints include:  Impaired Fasting Glucose HbA1C: 5.4 Lab Results  Component Value Date   HGBA1C 6.1 (H) 07/17/2015   Duration of elevated blood sugar: chronic Polydipsia: no Polyuria: no Weight change: no Visual disturbance: no Glucose Monitoring: no  Diabetic Education: Not Completed Family history of diabetes: yes  HYPERTENSION Hypertension status: controlled  Satisfied with current treatment? yes Duration of hypertension: chronic BP monitoring frequency:  not checking BP medication side effects:  no Medication compliance: excellent compliance Previous BP meds: benazepril, amlodipine Aspirin: yes Recurrent headaches: no Visual changes: no Palpitations: no Dyspnea: no Chest pain: no Lower extremity edema: no Dizzy/lightheaded: no  COPD COPD status: controlled Satisfied with current treatment?: yes Oxygen use: no Dyspnea frequency: never Cough frequency: never Rescue inhaler frequency: never  Limitation of activity: no Pneumovax: Not up to Date Influenza: Not up to Date  Interim Problems from his last visit: yes- has been under a lot of stress.   Depression Screen done today and results listed below:  Depression screen [facility] 2/9 08/24/2017 08/17/2016 07/20/2015 12/25/2014  Decreased Interest 0 0 0 0  Down, Depressed, Hopeless 0 0 0 0  PHQ - 2 Score 0 0 0 0  Altered sleeping - - 0 -  Tired, decreased energy - - 0 -  Change in appetite - - 2 -  Feeling bad or failure about yourself  - - 0 -  Trouble concentrating - - 0 -  Moving  slowly or fidgety/restless - - 0 -  Suicidal thoughts - - 0 -  PHQ-9 Score - - 2 -  Difficult doing work/chores - - Somewhat difficult -    Past Medical History:  Past Medical History:  Diagnosis Date  . Hepatitis C   . Hernia, inguinal 2014  . Hyperlipidemia   . Hypertension 2009  . Personal history of tobacco use, presenting hazards to health 08/26/2015  . Sleep apnea     Surgical History:  Past Surgical History:  Procedure Laterality Date  . COLONOSCOPY  2010  . HERNIA REPAIR  2014    Medications:  Current Outpatient Medications on File Prior to Visit  Medication Sig  . triamcinolone ointment (KENALOG) 0.5 % Apply 1 application topically 2 (two) times daily.  . valACYclovir (VALTREX) 500 MG tablet Take 500 mg by mouth 2 (two) times daily.   No current facility-administered medications on file prior to visit.     Allergies:  No Known Allergies  Social History:  Social History   Socioeconomic History  . Marital status: Divorced    Spouse name: Not on file  . Number of children: Not on file  . Years of education: Not on file  . Highest education level: Not on file  Social Needs  . Financial resource strain: Not hard at all  . Food insecurity - worry: Never true  . Food insecurity - inability: Never true  . Transportation needs - medical: No  . Transportation needs - non-medical: No  Occupational History  . Not on file  Tobacco Use  . Smoking status: Current Some Day Smoker    Packs/day: 0.10    Years: 45.00    Pack years: 4.50    Types: Cigarettes  . Smokeless tobacco: Never Used  . Tobacco comment: has had 5 since jan 2019  Substance and Sexual Activity  . Alcohol use: No    Alcohol/week: 0.0 oz  . Drug use: No  . Sexual activity: Not on file  Other Topics Concern  . Not on file  Social History Narrative  . Not on file   Social History   Tobacco Use  Smoking Status Current Some Day Smoker  . Packs/day: 0.10  . Years: 45.00  . Pack years:  4.50  . Types: Cigarettes  Smokeless Tobacco Never Used  Tobacco Comment   has had 5 since jan 2019   Social History   Substance and Sexual Activity  Alcohol Use No  . Alcohol/week: 0.0 oz    Family History:  Family History  Problem Relation Age of Onset  . Hypertension Mother   . Cirrhosis Father   . Gout Son   . Heart disease Maternal Uncle 61  . Cancer Paternal Uncle   . Diabetes Paternal Grandmother     Past medical history, surgical history, medications, allergies, family history and social history reviewed with patient today and changes made to appropriate areas of the chart.   Review of Systems  Constitutional: Negative.   HENT: Negative.   Eyes: Negative.   Respiratory: Negative.   Cardiovascular: Negative.   Gastrointestinal: Negative.   Genitourinary: Positive for urgency. Negative for dysuria, flank pain, frequency and hematuria.       + hesitancy  Musculoskeletal: Negative.   Skin: Positive for itching. Negative for rash.  Neurological: Negative.   Endo/Heme/Allergies: Negative.   Psychiatric/Behavioral: Negative for depression, hallucinations, memory loss, substance abuse and suicidal ideas. The patient is nervous/anxious. The patient does not have insomnia.     All other ROS negative except what is listed above and in the HPI.      Objective:    BP 124/70 (BP Location: Left Arm, Patient Position: Sitting, Cuff Size: Normal)   Pulse 82   Temp 97.7 F (36.5 C) (Oral)   Resp 16   Ht 5\' 11"  (1.803 m)   Wt 211 lb 12.8 oz (96.1 kg)   BMI 29.54 kg/m   Wt Readings from Last 3 Encounters:  08/24/17 211 lb 12.8 oz (96.1 kg)  08/24/17 211 lb 12.8 oz (96.1 kg)  07/22/17 210 lb 6 oz (95.4 kg)    Physical Exam  Results for orders placed or performed in visit on 02/16/17  Bayer DCA Hb A1c Waived  Result Value Ref Range   Bayer DCA Hb A1c Waived 5.5 <1.6 %  Basic metabolic panel  Result Value Ref Range   Glucose 80 65 - 99 mg/dL   BUN 14 8 - 27  mg/dL   Creatinine, Ser 1.07 0.76 - 1.27 mg/dL   GFR calc non Af Amer 70 >59 mL/min/1.73   GFR calc Af Amer 81 >59 mL/min/1.73   BUN/Creatinine Ratio 13 10 - 24   Sodium 140 134 - 144 mmol/L   Potassium 4.4 3.5 - 5.2 mmol/L   Chloride 103 96 - 106 mmol/L   CO2 20 20 - 29 mmol/L   Calcium 9.3 8.6 - 10.2 mg/dL      Assessment & Plan:   Problem List Items Addressed This Visit  Cardiovascular and Mediastinum   HTN (hypertension)    Under good control on current regimen. Continue current regimen. Continue to monitor. Call with any concerns.       Relevant Medications   sildenafil (REVATIO) 20 MG tablet   benazepril (LOTENSIN) 40 MG tablet   amLODipine (NORVASC) 10 MG tablet   aspirin 81 MG EC tablet   Other Relevant Orders   CBC With Differential/Platelet   Comprehensive metabolic panel   Microalbumin, Urine Waived   TSH   CAD (coronary artery disease)    Will keep BP and cholesterol and sugar under good control. Continue current regimen. Continue to monitor. Call with any concerns. To see Dr. Rockey Situ on 09/08/17 for this and for run of trigeminy on sleep study.       Relevant Medications   sildenafil (REVATIO) 20 MG tablet   benazepril (LOTENSIN) 40 MG tablet   amLODipine (NORVASC) 10 MG tablet   aspirin 81 MG EC tablet   Other Relevant Orders   CBC With Differential/Platelet   Comprehensive metabolic panel   Lipid Panel w/o Chol/HDL Ratio   TSH   Atherosclerosis of aorta (HCC)    Will keep BP and cholesterol and sugar under good control. Continue current regimen. Continue to monitor. Call with any concerns.       Relevant Medications   sildenafil (REVATIO) 20 MG tablet   benazepril (LOTENSIN) 40 MG tablet   amLODipine (NORVASC) 10 MG tablet   aspirin 81 MG EC tablet   Other Relevant Orders   CBC With Differential/Platelet   Comprehensive metabolic panel   Lipid Panel w/o Chol/HDL Ratio   TSH     Respiratory   COPD (chronic obstructive pulmonary disease)  (HCC)    Under good control on current regimen. Continue current regimen. Continue to monitor. Call with any concerns.       Relevant Orders   CBC With Differential/Platelet   Comprehensive metabolic panel   TSH   OSA on CPAP    Back on his CPAP. Feeling much better. Continue to monitor.       Relevant Orders   CBC With Differential/Platelet   Comprehensive metabolic panel   TSH     Digestive   Hep C w/o coma, chronic (HCC)    Has been treated. Will recheck viral load. Call with any concerns.       Relevant Orders   CBC With Differential/Platelet   Comprehensive metabolic panel   TSH   HCV RNA quant     Endocrine   IFG (impaired fasting glucose)    Under good control with A1c of 5.4. Call with any concerns. Continue to monitor.       Relevant Orders   CBC With Differential/Platelet   Bayer DCA Hb A1c Waived   Comprehensive metabolic panel   TSH     Genitourinary   ED (erectile dysfunction)    Under good control on sildenafil. Continue current regimen. Call with any concerns. Refill given.       Relevant Medications   sildenafil (REVATIO) 20 MG tablet   Other Relevant Orders   CBC With Differential/Platelet   Comprehensive metabolic panel   TSH     Other   Tobacco abuse    Can't afford chantix. Will start wellbutrin to help with cravings. Call with any concerns.       Relevant Orders   CBC With Differential/Platelet   Comprehensive metabolic panel   TSH   UA/M w/rflx Culture, Routine    Other  Visit Diagnoses    Routine general medical examination at a health care facility    -  Primary   Vaccines up to date. Screening labs checked today. Colonoscopy due in March. Continue diet and exercise. Call with any concerns.    Screening for colon cancer       Due for repeat colonoscopy in March- referral put in today.   Relevant Orders   Ambulatory referral to Gastroenterology   Urinary hesitancy       Checking PSA today. Await results.    Relevant Orders     PSA       Discussed aspirin prophylaxis for myocardial infarction prevention and decision was made to continue ASA  LABORATORY TESTING:  Health maintenance labs ordered today as discussed above.   The natural history of prostate cancer and ongoing controversy regarding screening and potential treatment outcomes of prostate cancer has been discussed with the patient. The meaning of a false positive PSA and a false negative PSA has been discussed. He indicates understanding of the limitations of this screening test and wishes to proceed with screening PSA testing.   IMMUNIZATIONS:   - Tdap: Tetanus vaccination status reviewed: last tetanus booster within 10 years. - Influenza: Up to date - Pneumovax: Up to date - Prevnar: Up to date - Zostavax vaccine: Up to date  SCREENING: - Colonoscopy: Due in March- ordered today  Discussed with patient purpose of the colonoscopy is to detect colon cancer at curable precancerous or early stages   PATIENT COUNSELING:    Sexuality: Discussed sexually transmitted diseases, partner selection, use of condoms, avoidance of unintended pregnancy  and contraceptive alternatives.   Advised to avoid cigarette smoking.  I discussed with the patient that most people either abstain from alcohol or drink within safe limits (<=14/week and <=4 drinks/occasion for males, <=7/weeks and <= 3 drinks/occasion for females) and that the risk for alcohol disorders and other health effects rises proportionally with the number of drinks per week and how often a drinker exceeds daily limits.  Discussed cessation/primary prevention of drug use and availability of treatment for abuse.   Diet: Encouraged to adjust caloric intake to maintain  or achieve ideal body weight, to reduce intake of dietary saturated fat and total fat, to limit sodium intake by avoiding high sodium foods and not adding table salt, and to maintain adequate dietary potassium and calcium preferably from  fresh fruits, vegetables, and low-fat dairy products.    stressed the importance of regular exercise  Injury prevention: Discussed safety belts, safety helmets, smoke detector, smoking near bedding or upholstery.   Dental health: Discussed importance of regular tooth brushing, flossing, and dental visits.   Follow up plan: NEXT PREVENTATIVE PHYSICAL DUE IN 1 YEAR. Return in about 6 months (around 02/21/2018) for Follow up.

## 2017-08-24 NOTE — Assessment & Plan Note (Signed)
Under good control on sildenafil. Continue current regimen. Call with any concerns. Refill given.

## 2017-08-24 NOTE — Progress Notes (Signed)
Subjective:   Nathan Cooper is a 72 y.o. male who presents for Medicare Annual/Subsequent preventive examination.   Review of Systems:  Cardiac Risk Factors include: hypertension;advanced age (>2men, >27 women);male gender;dyslipidemia;smoking/ tobacco exposure     Objective:    Vitals: BP 124/70 (BP Location: Left Arm, Patient Position: Sitting)   Pulse 82   Temp 97.7 F (36.5 C) (Temporal)   Resp 16   Ht 5\' 11"  (1.803 m)   Wt 211 lb 12.8 oz (96.1 kg)   BMI 29.54 kg/m   Body mass index is 29.54 kg/m.  Advanced Directives 08/24/2017 08/17/2016 07/17/2015  Does Patient Have a Medical Advance Directive? Yes Yes Yes  Type of Paramedic of [place];Living will [place];Living will Living will;Healthcare Power of Attorney  Does patient want to make changes to medical advance directive? - Yes (MAU/Ambulatory/Procedural Areas - Information given) No - Patient declined  Copy of [place] in Chart? No - copy requested No - copy requested No - copy requested    Tobacco Social History   Tobacco Use  Smoking Status Current Some Day Smoker  . Packs/day: 0.10  . Years: 45.00  . Pack years: 4.50  . Types: Cigarettes  Smokeless Tobacco Never Used  Tobacco Comment   has had 5 since jan 2019     Ready to quit: Yes Counseling given: Yes Comment: has had 5 since jan 2019   Clinical Intake:  Pre-visit preparation completed: Yes  Pain : No/denies pain     Nutritional Status: BMI 25 -29 Overweight Nutritional Risks: None Diabetes: No CBG done?: No Did pt. bring in CBG monitor from home?: No  How often do you need to have someone help you when you read instructions, pamphlets, or other written materials from your doctor or pharmacy?: 1 - Never What is the last grade level you completed in school?: 2 years college   Interpreter Needed?: No  Information entered by :: Tiffany Hill,LPN  Past Medical History:    Diagnosis Date  . Hepatitis C   . Hernia, inguinal 2014  . Hyperlipidemia   . Hypertension 2009  . Personal history of tobacco use, presenting hazards to health 08/26/2015  . Sleep apnea    Past Surgical History:  Procedure Laterality Date  . COLONOSCOPY  2010  . HERNIA REPAIR  2014   Family History  Problem Relation Age of Onset  . Hypertension Mother   . Cirrhosis Father   . Gout Son   . Heart disease Maternal Uncle 40  . Cancer Paternal Uncle   . Diabetes Paternal Grandmother    Social History   Socioeconomic History  . Marital status: Divorced    Spouse name: None  . Number of children: None  . Years of education: None  . Highest education level: None  Social Needs  . Financial resource strain: Not hard at all  . Food insecurity - worry: Never true  . Food insecurity - inability: Never true  . Transportation needs - medical: No  . Transportation needs - non-medical: No  Occupational History  . None  Tobacco Use  . Smoking status: Current Some Day Smoker    Packs/day: 0.10    Years: 45.00    Pack years: 4.50    Types: Cigarettes  . Smokeless tobacco: Never Used  . Tobacco comment: has had 5 since jan 2019  Substance and Sexual Activity  . Alcohol use: No    Alcohol/week: 0.0 oz  .  Drug use: No  . Sexual activity: None  Other Topics Concern  . None  Social History Narrative  . None    Outpatient Encounter Medications as of 08/24/2017  Medication Sig  . amLODipine (NORVASC) 10 MG tablet Take 1 tablet (10 mg total) by mouth daily.  Marland Kitchen aspirin 81 MG EC tablet TAKE 1 TABLET (81 MG TOTAL) BY MOUTH DAILY.  . benazepril (LOTENSIN) 40 MG tablet Take 1 tablet (40 mg total) by mouth daily.  . sildenafil (REVATIO) 20 MG tablet Take 1-5 tabs 30 minutes prior to intercourse  . triamcinolone ointment (KENALOG) 0.5 % Apply 1 application topically 2 (two) times daily.  . valACYclovir (VALTREX) 500 MG tablet Take 500 mg by mouth 2 (two) times daily.  . varenicline  (CHANTIX CONTINUING MONTH PAK) 1 MG tablet Take 1 tablet (1 mg total) by mouth 2 (two) times daily. (Patient not taking: Reported on 08/24/2017)  . varenicline (CHANTIX STARTING MONTH PAK) 0.5 MG X 11 & 1 MG X 42 tablet Take one 0.5 mg tablet by mouth once daily for 3 days, then increase to one 0.5 mg tab BID for 4 days, then increase to one 1 mg tab BID. (Patient not taking: Reported on 08/24/2017)   No facility-administered encounter medications on file as of 08/24/2017.     Activities of Daily Living In your present state of health, do you have any difficulty performing the following activities: 08/24/2017  Hearing? N  Vision? N  Difficulty concentrating or making decisions? N  Walking or climbing stairs? Y  Comment SOB   Dressing or bathing? N  Doing errands, shopping? N  Preparing Food and eating ? N  Using the Toilet? N  In the past six months, have you accidently leaked urine? N  Do you have problems with loss of bowel control? N  Managing your Medications? N  Managing your Finances? N  Housekeeping or managing your Housekeeping? N  Some recent data might be hidden    Patient Care Team: Valerie Roys, DO as PCP - General (Family Medicine)   Assessment:   This is a routine wellness examination for [name].  Exercise Activities and Dietary recommendations Current Exercise Habits: Home exercise routine, Type of exercise: strength training/weights, Time (Minutes): 10, Frequency (Times/Week): 7, Weekly Exercise (Minutes/Week): 70, Intensity: Mild, Exercise limited by: None identified  Goals    . Quit Smoking     Smoking cessation discussed       Fall Risk Fall Risk  08/24/2017 08/17/2016 07/20/2015 12/25/2014  Falls in the past year? No No No No   Is the patient's home free of loose throw rugs in walkways, pet beds, electrical cords, etc?   yes      Grab bars in the bathroom? no      Handrails on the stairs?   yes      Adequate lighting?   yes  Timed Get Up and Go Performed:  Completed in 8 seconds with no use of assistive devices, steady gait. No intervention needed at this time.   Depression Screen PHQ 2/9 Scores 08/24/2017 08/17/2016 07/20/2015 12/25/2014  PHQ - 2 Score 0 0 0 0  PHQ- 9 Score - - 2 -    Cognitive Function     6CIT Screen 08/24/2017 08/17/2016  What Year? 0 points 0 points  What month? 0 points 0 points  What time? 0 points 0 points  Count back from 20 0 points 0 points  Months in reverse 0 points 0 points  Repeat phrase 0 points 2 points  Total Score 0 2    Immunization History  Administered Date(s) Administered  . Hepatitis A, Adult 08/14/2015, 02/11/2016  . Hepatitis B, adult 07/17/2015, 08/14/2015, 02/11/2016  . Influenza, High Dose Seasonal PF 05/07/2016  . Influenza,inj,Quad PF,6+ Mos 07/17/2015  . Influenza-Unspecified 05/16/2014, 05/24/2017  . Pneumococcal Conjugate-13 05/16/2014  . Pneumococcal Polysaccharide-23 07/17/2015  . Td 01/23/2003, 07/17/2015    Qualifies for Shingles Vaccine? Yes, Discussed shingrix vaccine  Screening Tests Health Maintenance  Topic Date Due  . COLONOSCOPY  10/08/2017  . TETANUS/TDAP  07/16/2025  . INFLUENZA VACCINE  Completed  . Hepatitis C Screening  Completed  . PNA vac Low Risk Adult  Completed   Cancer Screenings: Lung: Low Dose CT Chest recommended if Age 79-80 years, 30 pack-year currently smoking OR have quit w/in 15years. Patient does not qualify. Colorectal: GI referral sent  Additional Screenings:  Hepatitis B/HIV/Syphillis:not indicated Hepatitis C Screening: completed 08/17/2016    Plan:    I have personally reviewed and addressed the Medicare Annual Wellness questionnaire and have noted the following in the patient's chart:  A. Medical and social history B. Use of alcohol, tobacco or illicit drugs  C. Current medications and supplements D. Functional ability and status E.  Nutritional status F.  Physical activity G. Advance directives H. List of other physicians I.    Hospitalizations, surgeries, and ER visits in previous 12 months J.  [place] such as hearing and vision if needed, cognitive and depression L. Referrals and appointments   In addition, I have reviewed and discussed with patient certain preventive protocols, quality metrics, and best practice recommendations. A written personalized care plan for preventive services as well as general preventive health recommendations were provided to patient.   Signed,  Tyler Aas, LPN Nurse Health Advisor   Nurse Notes:none

## 2017-08-24 NOTE — Assessment & Plan Note (Addendum)
Can't afford chantix. Will start wellbutrin to help with cravings. Call with any concerns.

## 2017-08-24 NOTE — Assessment & Plan Note (Signed)
Will keep BP and cholesterol and sugar under good control. Continue current regimen. Continue to monitor. Call with any concerns. To see Dr. Rockey Situ on 09/08/17 for this and for run of trigeminy on sleep study.

## 2017-08-25 LAB — LIPID PANEL W/O CHOL/HDL RATIO
CHOLESTEROL TOTAL: 174 mg/dL (ref 100–199)
HDL: 48 mg/dL (ref >39)
LDL CALC: 110 mg/dL — AB (ref 0–99)
TRIGLYCERIDES: 78 mg/dL (ref 0–149)
VLDL CHOLESTEROL CAL: 16 mg/dL (ref 5–40)

## 2017-08-25 LAB — HCV RNA QUANT: Hepatitis C Quantitation: NOT DETECTED IU/mL

## 2017-08-25 LAB — PSA: Prostate Specific Ag, Serum: 0.6 ng/mL (ref 0.0–4.0)

## 2017-08-25 LAB — COMPREHENSIVE METABOLIC PANEL
ALBUMIN: 4.7 g/dL (ref 3.6–4.8)
ALK PHOS: 66 IU/L (ref 39–117)
ALT: 9 IU/L (ref 0–44)
AST: 12 IU/L (ref 0–40)
Albumin/Globulin Ratio: 1.7 (ref 1.2–2.2)
BUN/Creatinine Ratio: 14 (ref 10–24)
BUN: 13 mg/dL (ref 8–27)
Bilirubin Total: 0.2 mg/dL (ref 0.0–1.2)
CALCIUM: 9.3 mg/dL (ref 8.6–10.2)
CO2: 18 mmol/L — AB (ref 20–29)
CREATININE: 0.94 mg/dL (ref 0.76–1.27)
Chloride: 104 mmol/L (ref 96–106)
GFR calc Af Amer: 95 mL/min/{1.73_m2} (ref >59)
GFR, EST NON AFRICAN AMERICAN: 82 mL/min/{1.73_m2} (ref >59)
GLUCOSE: 76 mg/dL (ref 65–99)
Globulin, Total: 2.8 g/dL (ref 1.5–4.5)
Potassium: 4.5 mmol/L (ref 3.5–5.2)
Sodium: 142 mmol/L (ref 134–144)
Total Protein: 7.5 g/dL (ref 6.0–8.5)

## 2017-08-25 LAB — TSH: TSH: 0.785 u[IU]/mL (ref 0.450–4.500)

## 2017-08-26 ENCOUNTER — Ambulatory Visit: Payer: Self-pay

## 2017-08-29 ENCOUNTER — Other Ambulatory Visit: Payer: Self-pay

## 2017-08-29 ENCOUNTER — Telehealth: Payer: Self-pay

## 2017-08-29 DIAGNOSIS — Z1211 Encounter for screening for malignant neoplasm of colon: Secondary | ICD-10-CM

## 2017-08-29 NOTE — Telephone Encounter (Signed)
Gastroenterology Pre-Procedure Review  Request Date: 09/21/17 Requesting Physician: Dr. Bonna Gains  PATIENT REVIEW QUESTIONS: The patient responded to the following health history questions as indicated:    1. Are you having any GI issues? no 2. Do you have a personal history of Polyps? no 3. Do you have a family history of Colon Cancer or Polyps? no 4. Diabetes Mellitus? no 5. Joint replacements in the past 12 months?no 6. Major health problems in the past 3 months?no 7. Any artificial heart valves, MVP, or defibrillator?no    MEDICATIONS & ALLERGIES:    Patient reports the following regarding taking any anticoagulation/antiplatelet therapy:   Plavix, Coumadin, Eliquis, Xarelto, Lovenox, Pradaxa, Brilinta, or Effient? no Aspirin? yes (81 mg daily)  Patient confirms/reports the following medications:  Current Outpatient Medications  Medication Sig Dispense Refill  . amLODipine (NORVASC) 10 MG tablet Take 1 tablet (10 mg total) by mouth daily. 90 tablet 1  . aspirin 81 MG EC tablet Take 1 tablet (81 mg total) by mouth daily. 100 tablet 3  . benazepril (LOTENSIN) 40 MG tablet Take 1 tablet (40 mg total) by mouth daily. 90 tablet 1  . buPROPion (WELLBUTRIN SR) 150 MG 12 hr tablet 1 tab qAM for 1 week, then increase to 1 tab 2x a day. 60 tablet 6  . sildenafil (REVATIO) 20 MG tablet Take 1-5 tabs 30 minutes prior to intercourse 90 tablet 12  . triamcinolone ointment (KENALOG) 0.5 % Apply 1 application topically 2 (two) times daily. 45 g 3  . valACYclovir (VALTREX) 500 MG tablet Take 500 mg by mouth 2 (two) times daily.     No current facility-administered medications for this visit.     Patient confirms/reports the following allergies:  No Known Allergies  No orders of the defined types were placed in this encounter.   AUTHORIZATION INFORMATION Primary Insurance: 1D#: Group #:  Secondary Insurance: 1D#: Group #:  SCHEDULE INFORMATION: Date:  09/21/17 Time: Location:[facility]

## 2017-08-31 ENCOUNTER — Telehealth: Payer: Self-pay | Admitting: Family Medicine

## 2017-08-31 NOTE — Telephone Encounter (Signed)
Sleep study report that states patient has trigeminy faxed to [place]  Thanks

## 2017-08-31 NOTE — Telephone Encounter (Signed)
Sleep study from Aug. 2018 printed from media and faxed to [facility].

## 2017-09-06 NOTE — Progress Notes (Signed)
Cardiology Office Note  Date:  09/08/2017   ID:  Nathan Cooper, DOB 04-Jan-1946, MRN [id number]  PCP:  Valerie Roys, DO   Chief Complaint  Patient presents with  . other    Discuss Trigeminy c/o sob with exertion. Meds reviewed verbally with pt.     HPI:  Nathan Cooper is a 72 year old gentleman with past medical history of Smoker, 45 yrs, COPD HTN osa on CPAP Hep C run of trigeminy on sleep study.  Who presents by referral from [facility] for discussion of his arrhythmia, PVCs,  shortness of breath on exertion  Reports that he recently we did his sleep study In the last 35 minutes of his sleep he had frequent PVCs, sometimes bigeminal, trigeminal pattern Reports he was asymptomatic Typically does not have palpitations or tachycardia at baseline  In general reports that he feels well During the warm weather likes to go walking on a regular basis 1-1-1/2 miles a day Denies having significant shortness of breath when he walks on a flat surface He does have some shortness of breath with hills and stairs  Trying to quit smoking, did well on Chantix but has relapsed Recently got a new refill on Chantix and is going to restart  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, does clerical work Often walking during the daytime to meet clients  Previous imaging studies reviewed with him Images pulled up in the office CT scans for 2019, 2018, 2017 Repeat study showing very mild coronary calcification, mild aortic arch, descending aorta atherosclerosis  EKG personally reviewed by myself on todays visit Shows normal sinus rhythm rate 77 bpm no significant ST or T wave changes  PMH:   has a past medical history of COPD (chronic obstructive pulmonary disease) ([place]), Coronary artery disease, Hepatitis C, Hernia, inguinal (2014), Hypertension (2009), Personal history of tobacco use, presenting hazards to health (08/26/2015), and Sleep apnea.  PSH:    Past Surgical History:   Procedure Laterality Date  . COLONOSCOPY  2010  . HERNIA REPAIR  2014    Current Outpatient Medications  Medication Sig Dispense Refill  . amLODipine (NORVASC) 10 MG tablet Take 1 tablet (10 mg total) by mouth daily. 90 tablet 1  . aspirin 81 MG EC tablet Take 1 tablet (81 mg total) by mouth daily. 100 tablet 3  . benazepril (LOTENSIN) 40 MG tablet Take 1 tablet (40 mg total) by mouth daily. 90 tablet 1  . sildenafil (REVATIO) 20 MG tablet Take 1-5 tabs 30 minutes prior to intercourse 90 tablet 12  . triamcinolone ointment (KENALOG) 0.5 % Apply 1 application topically 2 (two) times daily. 45 g 3  . valACYclovir (VALTREX) 500 MG tablet Take 500 mg by mouth 2 (two) times daily as needed.     . CHANTIX CONTINUING MONTH PAK 1 MG tablet TAKE 1 TABLET (1 MG TOTAL) BY MOUTH 2 (TWO) TIMES DAILY.  2   No current facility-administered medications for this visit.      Allergies:   Patient has no known allergies.   Social History:  The patient  reports that he has been smoking cigarettes.  He has a 4.50 pack-year smoking history. he has never used smokeless tobacco. He reports that he does not drink alcohol or use drugs.   Family History:   family history includes Cancer in his paternal uncle; Cirrhosis in his father; Diabetes in his paternal grandmother; Gout in his son; Heart attack in his maternal uncle; Heart disease (age of onset: 109)  in his maternal uncle; Hypertension in his mother.    Review of Systems: Review of Systems  Constitutional: Negative.   Respiratory: Positive for shortness of breath.   Cardiovascular: Negative.   Gastrointestinal: Negative.   Musculoskeletal: Negative.   Neurological: Negative.   Psychiatric/Behavioral: Negative.   All other systems reviewed and are negative.    PHYSICAL EXAM: VS:  BP 114/68 (BP Location: Right Arm, Patient Position: Sitting, Cuff Size: Normal)   Pulse 77   Ht 5\' 11"  (1.803 m)   Wt 214 lb 12 oz (97.4 kg)   BMI 29.95 kg/m  , BMI  Body mass index is 29.95 kg/m. GEN: Well nourished, well developed, in no acute distress  HEENT: normal  Neck: no JVD, carotid bruits, or masses Cardiac: RRR; no murmurs, rubs, or gallops,no edema  Respiratory:  clear to auscultation bilaterally, normal work of breathing GI: soft, nontender, nondistended, + BS MS: no deformity or atrophy  Skin: warm and dry, no rash Neuro:  Strength and sensation are intact Psych: euthymic mood, full affect    Recent Labs: 08/24/2017: ALT 9; BUN 13; Creatinine, Ser 0.94; Hemoglobin 14.2; Platelets 293; Potassium 4.5; Sodium 142; TSH 0.785    Lipid Panel Lab Results  Component Value Date   CHOL 174 08/24/2017   HDL 48 08/24/2017   LDLCALC 110 (H) 08/24/2017   TRIG 78 08/24/2017      Wt Readings from Last 3 Encounters:  09/08/17 214 lb 12 oz (97.4 kg)  09/07/17 210 lb (95.3 kg)  08/24/17 211 lb 12.8 oz (96.1 kg)       ASSESSMENT AND PLAN:  Atherosclerosis of aorta ([place]) - Plan: EKG 12-Lead Very mild aortic atherosclerosis seen on scans for the past 3 years Images reviewed with him in detail He has declined a statin at this time  Coronary artery disease of native artery of native heart with stable angina pectoris ([place]) - Plan: EKG 12-Lead Very mild coronary calcification seen Denies having any symptoms concerning for angina Active at baseline with no symptoms Images shown to him in the office today He will work on lifestyle modification, work on smoking cessation  Essential hypertension Blood pressure is well controlled on today's visit. No changes made to the medications.  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 EKG 12-Lead     Signed, Esmond Plants, M.D., Ph.D. 09/08/2017  [place], [place]

## 2017-09-07 ENCOUNTER — Ambulatory Visit
Admission: RE | Admit: 2017-09-07 | Discharge: 2017-09-07 | Disposition: A | Discharge status: Home / Self Care | Payer: Medicare Other | Source: Ambulatory Visit | Attending: Oncology | Admitting: Oncology

## 2017-09-07 DIAGNOSIS — Z122 Encounter for screening for malignant neoplasm of respiratory organs: Secondary | ICD-10-CM | POA: Insufficient documentation

## 2017-09-07 DIAGNOSIS — Z87891 Personal history of nicotine dependence: Secondary | ICD-10-CM | POA: Insufficient documentation

## 2017-09-07 DIAGNOSIS — I700 Atherosclerosis of aorta: Secondary | ICD-10-CM | POA: Insufficient documentation

## 2017-09-07 DIAGNOSIS — J439 Emphysema, unspecified: Secondary | ICD-10-CM | POA: Insufficient documentation

## 2017-09-07 DIAGNOSIS — I2510 Atherosclerotic heart disease of native coronary artery without angina pectoris: Secondary | ICD-10-CM | POA: Insufficient documentation

## 2017-09-08 ENCOUNTER — Encounter: Payer: Self-pay | Admitting: Cardiovascular Disease

## 2017-09-08 ENCOUNTER — Ambulatory Visit (INDEPENDENT_AMBULATORY_CARE_PROVIDER_SITE_OTHER): Payer: Medicare Other | Admitting: Cardiovascular Disease

## 2017-09-08 VITALS — BP 114/68 | HR 77 | Ht 71.0 in | Wt 214.8 lb

## 2017-09-08 DIAGNOSIS — Z720 Tobacco use: Secondary | ICD-10-CM

## 2017-09-08 DIAGNOSIS — I700 Atherosclerosis of aorta: Secondary | ICD-10-CM | POA: Diagnosis not present

## 2017-09-08 DIAGNOSIS — I25118 Atherosclerotic heart disease of native coronary artery with other forms of angina pectoris: Secondary | ICD-10-CM

## 2017-09-08 DIAGNOSIS — J449 Chronic obstructive pulmonary disease, unspecified: Secondary | ICD-10-CM

## 2017-09-08 DIAGNOSIS — I10 Essential (primary) hypertension: Secondary | ICD-10-CM | POA: Diagnosis not present

## 2017-09-08 NOTE — Patient Instructions (Signed)
Medication Instructions:   No medication changes made  Talk with Dr. Wynetta Emery about ? Cholesterol medication  Labwork:  No new labs needed  Testing/Procedures:  No further testing at this time   Follow-Up: It was a pleasure seeing you in the office today. Please call us if you have new issues that need to be addressed before your next appt.  [phone]  Your physician wants you to follow-up in:  As needed  If you need a refill on your cardiac medications before your next appointment, please call your pharmacy.  For educational health videos Log in to : [email or web address] Or : [email or web address], password : [email or web address]

## 2017-09-12 ENCOUNTER — Encounter: Payer: Self-pay | Admitting: *Deleted

## 2017-09-15 DIAGNOSIS — G4733 Obstructive sleep apnea (adult) (pediatric): Secondary | ICD-10-CM | POA: Diagnosis not present

## 2017-09-21 ENCOUNTER — Encounter
Admission: RE | Disposition: A | Discharge status: Home / Self Care | Payer: Self-pay | Source: Ambulatory Visit | Attending: Gastroenterology

## 2017-09-21 ENCOUNTER — Encounter: Payer: Self-pay | Admitting: Student

## 2017-09-21 ENCOUNTER — Ambulatory Visit: Payer: Medicare Other | Admitting: Registered Nurse

## 2017-09-21 ENCOUNTER — Other Ambulatory Visit: Payer: Self-pay

## 2017-09-21 ENCOUNTER — Ambulatory Visit
Admission: RE | Admit: 2017-09-21 | Discharge: 2017-09-21 | Disposition: A | Discharge status: Home / Self Care | Payer: Medicare Other | Source: Ambulatory Visit | Attending: Gastroenterology | Admitting: Gastroenterology

## 2017-09-21 DIAGNOSIS — J449 Chronic obstructive pulmonary disease, unspecified: Secondary | ICD-10-CM | POA: Diagnosis not present

## 2017-09-21 DIAGNOSIS — Z8619 Personal history of other infectious and parasitic diseases: Secondary | ICD-10-CM | POA: Insufficient documentation

## 2017-09-21 DIAGNOSIS — Z8379 Family history of other diseases of the digestive system: Secondary | ICD-10-CM | POA: Diagnosis not present

## 2017-09-21 DIAGNOSIS — G4730 Sleep apnea, unspecified: Secondary | ICD-10-CM | POA: Diagnosis not present

## 2017-09-21 DIAGNOSIS — I10 Essential (primary) hypertension: Secondary | ICD-10-CM | POA: Insufficient documentation

## 2017-09-21 DIAGNOSIS — Z833 Family history of diabetes mellitus: Secondary | ICD-10-CM | POA: Diagnosis not present

## 2017-09-21 DIAGNOSIS — Z8249 Family history of ischemic heart disease and other diseases of the circulatory system: Secondary | ICD-10-CM | POA: Insufficient documentation

## 2017-09-21 DIAGNOSIS — G4733 Obstructive sleep apnea (adult) (pediatric): Secondary | ICD-10-CM | POA: Diagnosis not present

## 2017-09-21 DIAGNOSIS — Z7982 Long term (current) use of aspirin: Secondary | ICD-10-CM | POA: Insufficient documentation

## 2017-09-21 DIAGNOSIS — Z79899 Other long term (current) drug therapy: Secondary | ICD-10-CM | POA: Diagnosis not present

## 2017-09-21 DIAGNOSIS — F17210 Nicotine dependence, cigarettes, uncomplicated: Secondary | ICD-10-CM | POA: Diagnosis not present

## 2017-09-21 DIAGNOSIS — D125 Benign neoplasm of sigmoid colon: Secondary | ICD-10-CM

## 2017-09-21 DIAGNOSIS — Z1211 Encounter for screening for malignant neoplasm of colon: Secondary | ICD-10-CM

## 2017-09-21 DIAGNOSIS — I2510 Atherosclerotic heart disease of native coronary artery without angina pectoris: Secondary | ICD-10-CM | POA: Insufficient documentation

## 2017-09-21 DIAGNOSIS — K635 Polyp of colon: Secondary | ICD-10-CM | POA: Diagnosis not present

## 2017-09-21 DIAGNOSIS — Z8349 Family history of other endocrine, nutritional and metabolic diseases: Secondary | ICD-10-CM | POA: Insufficient documentation

## 2017-09-21 DIAGNOSIS — Z809 Family history of malignant neoplasm, unspecified: Secondary | ICD-10-CM | POA: Insufficient documentation

## 2017-09-21 DIAGNOSIS — D120 Benign neoplasm of cecum: Secondary | ICD-10-CM

## 2017-09-21 HISTORY — PX: COLONOSCOPY WITH PROPOFOL: SHX5780

## 2017-09-21 SURGERY — COLONOSCOPY WITH PROPOFOL
Anesthesia: General

## 2017-09-21 MED ORDER — LIDOCAINE HCL (PF) 1 % IJ SOLN
INTRAMUSCULAR | Status: AC
  Filled 2017-09-21: qty 2

## 2017-09-21 MED ORDER — PROPOFOL 500 MG/50ML IV EMUL
INTRAVENOUS | Status: DC | PRN
  Administered 2017-09-21: 100 ug/kg/min via INTRAVENOUS

## 2017-09-21 MED ORDER — SODIUM CHLORIDE 0.9 % IV SOLN: INTRAVENOUS | Status: DC

## 2017-09-21 MED ORDER — LACTATED RINGERS IV SOLN
INTRAVENOUS | Status: DC | PRN
  Administered 2017-09-21: 09:00:00 via INTRAVENOUS

## 2017-09-21 MED ORDER — PROPOFOL 10 MG/ML IV BOLUS
INTRAVENOUS | Status: DC | PRN
  Administered 2017-09-21: 50 mg via INTRAVENOUS

## 2017-09-21 NOTE — Op Note (Signed)
[facility] Gastroenterology Patient Name: Nathan Cooper Procedure Date: 09/21/2017 8:38 AM MRN: [id number] Account #: [id number] Date of Birth: 10-11-1945 Admit Type: Outpatient Age: 72 Room: [facility] ENDO ROOM 2 Gender: Male Note Status: Finalized Procedure:            Colonoscopy Indications:          Screening for colorectal malignant neoplasm Providers:            Adriannah Steinkamp B. Bonna Gains MD, MD Referring MD:         Valerie Roys (Referring MD) Medicines:            Monitored Anesthesia Care Complications:        No immediate complications. Procedure:            Pre-Anesthesia Assessment:                       - ASA Grade Assessment: III - A patient with severe                        systemic disease.                       - Prior to the procedure, a History and Physical was                        performed, and patient medications, allergies and                        sensitivities were reviewed. The patient's tolerance of                        previous anesthesia was reviewed.                       - The risks and benefits of the procedure and the                        sedation options and risks were discussed with the                        patient. All questions were answered and informed                        consent was obtained.                       - Patient identification and proposed procedure were                        verified prior to the procedure by the physician, the                        nurse, the anesthesiologist, the anesthetist and the                        technician. The procedure was verified in the procedure                        room.  After obtaining informed consent, the colonoscope was                        passed under direct vision. Throughout the procedure,                        the patient's blood pressure, pulse, and oxygen                        saturations were monitored continuously. The                         Colonoscope was introduced through the anus and                        advanced to the the cecum, identified by appendiceal                        orifice and ileocecal valve. The colonoscopy was                        performed with ease. The patient tolerated the                        procedure well. The quality of the bowel preparation                        was good except the ascending colon was fair. Findings:      The perianal and digital rectal examinations were normal.      The retroflexed view of the distal rectum and anal verge was normal and       showed no anal or rectal abnormalities.      A 4 mm polyp was found in the cecum. The polyp was sessile. The polyp       was removed with a jumbo cold forceps. Resection and retrieval were       complete.      A 3 mm polyp was found in the sigmoid colon. The polyp was sessile. The       polyp was removed with a jumbo cold forceps. Resection and retrieval       were complete.      A 5 mm polyp was found in the sigmoid colon. The polyp was sessile. The       polyp was removed with a cold snare. Resection and retrieval were       complete.      The exam was otherwise without abnormality.      The rectum, sigmoid colon, descending colon, transverse colon, ascending       colon and cecum appeared normal. Impression:           - The distal rectum and anal verge are normal on                        retroflexion view.                       - One 4 mm polyp in the cecum, removed with a jumbo                        cold forceps. Resected and retrieved.                       -  One 3 mm polyp in the sigmoid colon, removed with a                        jumbo cold forceps. Resected and retrieved.                       - One 5 mm polyp in the sigmoid colon, removed with a                        cold snare. Resected and retrieved.                       - The examination was otherwise normal.                       - The rectum,  sigmoid colon, descending colon,                        transverse colon, ascending colon and cecum are normal. Recommendation:       - Discharge patient to home (with escort).                       - Advance diet as tolerated.                       - Continue present medications.                       - Await pathology results.                       - Repeat colonoscopy in 3 - 5 years for surveillance.                       - The findings and recommendations were discussed with                        the patient.                       - The findings and recommendations were discussed with                        the patient's family.                       - Return to primary care physician as previously                        scheduled. Procedure Code(s):    --- Professional ---                       [phone], Colonoscopy, flexible; with removal of tumor(s),                        polyp(s), or other lesion(s) by snare technique                       [address]147, 59, Colonoscopy, flexible; with biopsy, single  or multiple Diagnosis Code(s):    --- Professional ---                       Z12.11, Encounter for screening for malignant neoplasm                        of colon                       D12.0, Benign neoplasm of cecum                       D12.5, Benign neoplasm of sigmoid colon CPT copyright 2016 American Medical Association. All rights reserved. The codes documented in this report are preliminary and upon coder review may  be revised to meet current compliance requirements.  Vonda Antigua, MD Margretta Sidle B. Bonna Gains MD, MD 09/21/2017 9:49:25 AM This report has been signed electronically. Number of Addenda: 0 Note Initiated On: 09/21/2017 8:38 AM Scope Withdrawal Time: 0 hours 31 minutes 35 seconds  Total Procedure Duration: 0 hours 56 minutes 18 seconds  Estimated Blood Loss: Estimated blood loss: none.      [facility]

## 2017-09-21 NOTE — Transfer of Care (Signed)
Immediate Anesthesia Transfer of Care Note  Patient: Nathan Cooper  Procedure(s) Performed: COLONOSCOPY WITH PROPOFOL (N/A )  Patient Location: PACU  Anesthesia Type:General  Level of Consciousness: awake  Airway & Oxygen Therapy: Patient Spontanous Breathing and Patient connected to nasal cannula oxygen  Post-op Assessment: Report given to RN  Post vital signs: Reviewed and stable  Last Vitals:  Vitals:   09/21/17 0729  BP: 136/72  Pulse: 96  Resp: 18  Temp: (!) 35.4 C  SpO2: 98%    Last Pain:  Vitals:   09/21/17 0729  TempSrc: Tympanic         Complications: No apparent anesthesia complications

## 2017-09-21 NOTE — Anesthesia Postprocedure Evaluation (Signed)
Anesthesia Post Note  Patient: Nathan Cooper  Procedure(s) Performed: COLONOSCOPY WITH PROPOFOL (N/A )  Patient location during evaluation: Endoscopy Anesthesia Type: General Level of consciousness: awake and alert Pain management: pain level controlled Vital Signs Assessment: post-procedure vital signs reviewed and stable Respiratory status: spontaneous breathing, nonlabored ventilation, respiratory function stable and patient connected to nasal cannula oxygen Cardiovascular status: blood pressure returned to baseline and stable Postop Assessment: no apparent nausea or vomiting Anesthetic complications: no     Last Vitals:  Vitals:   09/21/17 0959 09/21/17 1009  BP: 110/72 140/78  Pulse: 76 71  Resp: 18 18  Temp:    SpO2: 100% 99%    Last Pain:  Vitals:   09/21/17 0950  TempSrc: Tympanic                 Precious Haws Amandeep Hogston

## 2017-09-21 NOTE — H&P (Signed)
Vonda Antigua, MD [address] [place], [place], [place], [address] [address], [place], [place], [place], [address] Phone: [phone]  Fax: [phone]  Primary Care Physician:  Valerie Roys, DO   Pre-Procedure History & Physical: HPI:  Nathan Cooper is a 72 y.o. male is here for a colonoscopy.   Past Medical History:  Diagnosis Date  . COPD (chronic obstructive pulmonary disease) ([place])   . Coronary artery disease   . Hepatitis C   . Hernia, inguinal 2014  . Hypertension 2009  . Personal history of tobacco use, presenting hazards to health 08/26/2015  . Sleep apnea     Past Surgical History:  Procedure Laterality Date  . COLONOSCOPY  2010  . HERNIA REPAIR  2014    Prior to Admission medications   Medication Sig Start Date End Date Taking? Authorizing Provider  amLODipine (NORVASC) 10 MG tablet Take 1 tablet (10 mg total) by mouth daily. 08/24/17  Yes Johnson, [name], DO  aspirin 81 MG EC tablet Take 1 tablet (81 mg total) by mouth daily. 08/24/17  Yes Johnson, [name], DO  benazepril (LOTENSIN) 40 MG tablet Take 1 tablet (40 mg total) by mouth daily. 08/24/17  Yes Johnson, [name], DO  CHANTIX CONTINUING MONTH PAK 1 MG tablet TAKE 1 TABLET (1 MG TOTAL) BY MOUTH 2 (TWO) TIMES DAILY. 09/06/17  Yes [provider]  sildenafil (REVATIO) 20 MG tablet Take 1-5 tabs 30 minutes prior to intercourse 08/24/17  Yes Johnson, [name], DO  triamcinolone ointment (KENALOG) 0.5 % Apply 1 application topically 2 (two) times daily. 07/22/17  Yes Johnson, [name], DO  valACYclovir (VALTREX) 500 MG tablet Take 500 mg by mouth 2 (two) times daily as needed.     [provider]    Allergies as of 08/29/2017  . (No Known Allergies)    Family History  Problem Relation Age of Onset  . Hypertension Mother   . Cirrhosis Father   . Gout Son   . Heart disease Maternal Uncle 71  . Heart attack Maternal Uncle   . Cancer Paternal Uncle   . Diabetes Paternal  Grandmother     Social History   Socioeconomic History  . Marital status: Divorced    Spouse name: Not on file  . Number of children: Not on file  . Years of education: Not on file  . Highest education level: Not on file  Social Needs  . Financial resource strain: Not hard at all  . Food insecurity - worry: Never true  . Food insecurity - inability: Never true  . Transportation needs - medical: No  . Transportation needs - non-medical: No  Occupational History  . Not on file  Tobacco Use  . Smoking status: Current Some Day Smoker    Packs/day: 0.10    Years: 45.00    Pack years: 4.50    Types: Cigarettes  . Smokeless tobacco: Never Used  . Tobacco comment: has had 5 since jan 2019  Substance and Sexual Activity  . Alcohol use: No    Alcohol/week: 0.0 oz  . Drug use: No  . Sexual activity: Not on file  Other Topics Concern  . Not on file  Social History Narrative  . Not on file    Review of Systems: See HPI, otherwise negative ROS  Physical Exam: BP 136/72   Pulse 96   Temp (!) 95.8 F (35.4 C) (Tympanic)   Resp 18   Ht 5\' 11"  (1.803  m)   Wt 213 lb (96.6 kg)   SpO2 98%   BMI 29.71 kg/m  General:   Alert,  pleasant and cooperative in NAD Head:  Normocephalic and atraumatic. Neck:  Supple; no masses or thyromegaly. Lungs:  Clear throughout to auscultation, normal respiratory effort.    Heart:  +S1, +S2, Regular rate and rhythm, No edema. Abdomen:  Soft, nontender and nondistended. Normal bowel sounds, without guarding, and without rebound.   Neurologic:  Alert and  oriented x4;  grossly normal neurologically.  Impression/Plan: Nathan Cooper is here for a colonoscopy to be performed for average risk screening.  Risks, benefits, limitations, and alternatives regarding  colonoscopy have been reviewed with the patient.  Questions have been answered.  All parties agreeable.   Virgel Manifold, MD  09/21/2017, 8:10 AM

## 2017-09-21 NOTE — Anesthesia Post-op Follow-up Note (Signed)
Anesthesia QCDR form completed.        

## 2017-09-21 NOTE — Anesthesia Preprocedure Evaluation (Signed)
Anesthesia Evaluation  Patient identified by MRN, date of birth, ID band Patient awake    Reviewed: Allergy & Precautions, H&P , NPO status , Patient's Chart, lab work & pertinent test results  History of Anesthesia Complications (+) DIFFICULT IV STICK / SPECIAL LINE and history of anesthetic complications  Airway Mallampati: III  TM Distance: >3 FB Neck ROM: limited    Dental  (+) Chipped, Poor Dentition, Missing   Pulmonary neg shortness of breath, sleep apnea and Continuous Positive Airway Pressure Ventilation , COPD, Current Smoker,           Cardiovascular Exercise Tolerance: Good hypertension, (-) angina+ CAD and + Peripheral Vascular Disease  (-) Past MI, (-) Cardiac Stents and (-) DOE      Neuro/Psych negative neurological ROS  negative psych ROS   GI/Hepatic negative GI ROS, (+) Hepatitis -, C  Endo/Other  negative endocrine ROS  Renal/GU negative Renal ROS  negative genitourinary   Musculoskeletal   Abdominal   Peds  Hematology negative hematology ROS (+)   Anesthesia Other Findings Past Medical History: No date: COPD (chronic obstructive pulmonary disease) ([facility]) No date: Coronary artery disease No date: Hepatitis C 2014: Hernia, inguinal 2009: Hypertension 08/26/2015: Personal history of tobacco use, presenting hazards to  health No date: Sleep apnea  Past Surgical History: 2010: COLONOSCOPY 2014: HERNIA REPAIR  BMI    Body Mass Index:  29.71 kg/m      Reproductive/Obstetrics negative OB ROS                             Anesthesia Physical Anesthesia Plan  ASA: III  Anesthesia Plan: General   Post-op Pain Management:    Induction: Intravenous  PONV Risk Score and Plan: Propofol infusion  Airway Management Planned: Natural Airway and Nasal Cannula  Additional Equipment:   Intra-op Plan:   Post-operative Plan:   Informed Consent: I have reviewed the  patients History and Physical, chart, labs and discussed the procedure including the risks, benefits and alternatives for the proposed anesthesia with the patient or authorized representative who has indicated his/her understanding and acceptance.   Dental Advisory Given  Plan Discussed with: Anesthesiologist, CRNA and Surgeon  Anesthesia Plan Comments: (Patient consented for risks of anesthesia including but not limited to:  - adverse reactions to medications - risk of intubation if required - damage to teeth, lips or other oral mucosa - sore throat or hoarseness - Damage to heart, brain, lungs or loss of life  Patient voiced understanding.)        Anesthesia Quick Evaluation

## 2017-09-22 ENCOUNTER — Encounter: Payer: Self-pay | Admitting: Gastroenterology

## 2017-09-22 LAB — SURGICAL PATHOLOGY

## 2017-09-28 ENCOUNTER — Encounter: Payer: Self-pay | Admitting: Gastroenterology

## 2017-09-29 ENCOUNTER — Encounter: Payer: Self-pay | Admitting: Family Medicine

## 2017-09-29 ENCOUNTER — Ambulatory Visit
Admission: RE | Admit: 2017-09-29 | Discharge: 2017-09-29 | Disposition: A | Discharge status: Home / Self Care | Payer: Medicare Other | Source: Ambulatory Visit | Attending: Family Medicine | Admitting: Family Medicine

## 2017-09-29 ENCOUNTER — Ambulatory Visit (INDEPENDENT_AMBULATORY_CARE_PROVIDER_SITE_OTHER): Payer: Medicare Other | Admitting: Family Medicine

## 2017-09-29 VITALS — BP 147/81 | HR 73 | Temp 98.9°F | Wt 213.9 lb

## 2017-09-29 DIAGNOSIS — R140 Abdominal distension (gaseous): Secondary | ICD-10-CM | POA: Insufficient documentation

## 2017-09-29 DIAGNOSIS — R195 Other fecal abnormalities: Secondary | ICD-10-CM | POA: Diagnosis not present

## 2017-09-29 DIAGNOSIS — R937 Abnormal findings on diagnostic imaging of other parts of musculoskeletal system: Secondary | ICD-10-CM | POA: Insufficient documentation

## 2017-09-29 DIAGNOSIS — R3129 Other microscopic hematuria: Secondary | ICD-10-CM | POA: Diagnosis not present

## 2017-09-29 DIAGNOSIS — M545 Low back pain: Secondary | ICD-10-CM

## 2017-09-29 DIAGNOSIS — R319 Hematuria, unspecified: Secondary | ICD-10-CM | POA: Diagnosis not present

## 2017-09-29 LAB — MICROSCOPIC EXAMINATION: BACTERIA UA: NONE SEEN

## 2017-09-29 LAB — UA/M W/RFLX CULTURE, ROUTINE
BILIRUBIN UA: NEGATIVE
Glucose, UA: NEGATIVE
Ketones, UA: NEGATIVE
Leukocytes, UA: NEGATIVE
NITRITE UA: NEGATIVE
PH UA: 7 (ref 5.0–7.5)
PROTEIN UA: NEGATIVE
Specific Gravity, UA: 1.015 (ref 1.005–1.030)
UUROB: 0.2 mg/dL (ref 0.2–1.0)

## 2017-09-29 NOTE — Progress Notes (Signed)
BP (!) 147/81   Pulse 73   Temp 98.9 F (37.2 C) (Oral)   Wt 213 lb 14.4 oz (97 kg)   SpO2 98%   BMI 29.83 kg/m    Subjective:    Patient ID: Nathan Cooper, male    DOB: 01/11/1946, 72 y.o.   MRN: [id number]  HPI: Nathan Cooper is a 72 y.o. male  Chief Complaint  Patient presents with  . Pain    pt states he has had low back pain since December and groin pain for the past 2 weeks    BACK PAIN Duration: 3 months, recently all the time- bought a new mattress in December and it got better, but then came back about 2 weeks ago Mechanism of injury: unknown Location: bilateral and low back Onset: gradual Severity: moderate Quality: sharp with movement and aching Frequency: constant Radiation: into R groin and into his testicle Aggravating factors: moving, squatting Alleviating factors: heating pad Status: better Treatments attempted: rest, ice, heat, APAP, ibuprofen and aleve  Relief with NSAIDs?: significant Nighttime pain:  no Paresthesias / decreased sensation:  no Bowel / bladder incontinence:  no Fevers:  no Dysuria / urinary frequency:  yes  Relevant past medical, surgical, family and social history reviewed and updated as indicated. Interim medical history since our last visit reviewed. Allergies and medications reviewed and updated.  Review of Systems  Constitutional: Negative.   Respiratory: Negative.   Cardiovascular: Negative.   Genitourinary: Positive for frequency and testicular pain. Negative for decreased urine volume, difficulty urinating, discharge, dysuria, enuresis, flank pain, genital sores, hematuria, penile pain, penile swelling, scrotal swelling and urgency.  Musculoskeletal: Positive for back pain and myalgias. Negative for arthralgias, gait problem, joint swelling, neck pain and neck stiffness.  Skin: Negative.   Neurological: Negative for dizziness, tremors, seizures, syncope, facial asymmetry, speech difficulty, weakness,  light-headedness, numbness and headaches.  Psychiatric/Behavioral: Negative.     Per HPI unless specifically indicated above     Objective:    BP (!) 147/81   Pulse 73   Temp 98.9 F (37.2 C) (Oral)   Wt 213 lb 14.4 oz (97 kg)   SpO2 98%   BMI 29.83 kg/m   Wt Readings from Last 3 Encounters:  09/29/17 213 lb 14.4 oz (97 kg)  09/21/17 213 lb (96.6 kg)  09/08/17 214 lb 12 oz (97.4 kg)    Physical Exam  Constitutional: He is oriented to person, place, and time. He appears well-developed and well-nourished. No distress.  HENT:  Head: Normocephalic and atraumatic.  Right Ear: Hearing and external ear normal.  Left Ear: Hearing and external ear normal.  Nose: Nose normal.  Mouth/Throat: Oropharynx is clear and moist. No oropharyngeal exudate.  Eyes: Conjunctivae, EOM and lids are normal. Pupils are equal, round, and reactive to light. Right eye exhibits no discharge. Left eye exhibits no discharge. No scleral icterus.  Neck: Normal range of motion. Neck supple. No JVD present. No tracheal deviation present. No thyromegaly present.  Cardiovascular: Normal rate, regular rhythm, normal heart sounds and intact distal pulses. Exam reveals no gallop and no friction rub.  No murmur heard. Pulmonary/Chest: Effort normal and breath sounds normal. No stridor. No respiratory distress. He has no wheezes. He has no rales. He exhibits no tenderness.  Abdominal: Soft. Bowel sounds are normal. He exhibits no distension and no mass. There is tenderness (+ CVA tenderness on the R). There is no rebound and no guarding.  Musculoskeletal: Normal range of motion.  Lymphadenopathy:    He has no cervical adenopathy.  Neurological: He is alert and oriented to person, place, and time.  Skin: Skin is warm, dry and intact. No rash noted. He is not diaphoretic. No erythema. No pallor.  Psychiatric: He has a normal mood and affect. His speech is normal and behavior is normal. Judgment and thought content  normal. Cognition and memory are normal.  Nursing note and vitals reviewed.   Results for orders placed or performed in visit on 09/29/17  Microscopic Examination  Result Value Ref Range   WBC, UA 0-5 0 - 5 /hpf   RBC, UA 0-2 0 - 2 /hpf   Epithelial Cells (non renal) CANCELED    Bacteria, UA None seen None seen/Few  UA/M w/rflx Culture, Routine  Result Value Ref Range   Specific Gravity, UA 1.015 1.005 - 1.030   pH, UA 7.0 5.0 - 7.5   Color, UA Yellow Yellow   Appearance Ur Clear Clear   Leukocytes, UA Negative Negative   Protein, UA Negative Negative/Trace   Glucose, UA Negative Negative   Ketones, UA Negative Negative   RBC, UA Trace (A) Negative   Bilirubin, UA Negative Negative   Urobilinogen, Ur 0.2 0.2 - 1.0 mg/dL   Nitrite, UA Negative Negative   Microscopic Examination See below:       Assessment & Plan:   Problem List Items Addressed This Visit    None    Visit Diagnoses    Low back pain, unspecified back pain laterality, unspecified chronicity, with sciatica presence unspecified    -  Primary   Concern for kidney stone, seems to have passed it, as pain has improved. Will obtain x-ray. Await results. Call with any concerns or if pain worsens.    Relevant Orders   UA/M w/rflx Culture, Routine (Completed)   DG Abd 1 View (Completed)   Other microscopic hematuria       Concern for kidney stone, seems to have passed it, as pain has improved. Will obtain x-ray. Await results. Call with any concerns or if pain worsens.    Relevant Orders   DG Abd 1 View (Completed)       Follow up plan: Return As scheduled.

## 2017-09-29 NOTE — Patient Instructions (Addendum)
Dietary Guidelines to Help Prevent Kidney Stones Kidney stones are deposits of minerals and salts that form inside your kidneys. Your risk of developing kidney stones may be greater depending on your diet, your lifestyle, the medicines you take, and whether you have certain medical conditions. Most people can reduce their chances of developing kidney stones by following the instructions below. Depending on your overall health and the type of kidney stones you tend to develop, your dietitian may give you more specific instructions. What are tips for following this plan? Reading food labels  Choose foods with "no salt added" or "low-salt" labels. Limit your sodium intake to less than 1500 mg per day.  Choose foods with calcium for each meal and snack. Try to eat about 300 mg of calcium at each meal. Foods that contain 200-500 mg of calcium per serving include: ? 8 oz (237 ml) of milk, fortified nondairy milk, and fortified fruit juice. ? 8 oz (237 ml) of kefir, yogurt, and soy yogurt. ? 4 oz (118 ml) of tofu. ? 1 oz of cheese. ? 1 cup (300 g) of dried figs. ? 1 cup (91 g) of cooked broccoli. ? 1-3 oz can of sardines or mackerel.  Most people need 1000 to 1500 mg of calcium each day. Talk to your dietitian about how much calcium is recommended for you. Shopping  Buy plenty of fresh fruits and vegetables. Most people do not need to avoid fruits and vegetables, even if they contain nutrients that may contribute to kidney stones.  When shopping for convenience foods, choose: ? Whole pieces of fruit. ? Premade salads with dressing on the side. ? Low-fat fruit and yogurt smoothies.  Avoid buying frozen meals or prepared deli foods.  Look for foods with live cultures, such as yogurt and kefir. Cooking  Do not add salt to food when cooking. Place a salt shaker on the table and allow each person to add his or her own salt to taste.  Use vegetable protein, such as beans, textured vegetable  protein (TVP), or tofu instead of meat in pasta, casseroles, and soups. Meal planning  Eat less salt, if told by your dietitian. To do this: ? Avoid eating processed or premade food. ? Avoid eating fast food.  Eat less animal protein, including cheese, meat, poultry, or fish, if told by your dietitian. To do this: ? Limit the number of times you have meat, poultry, fish, or cheese each week. Eat a diet free of meat at least 2 days a week. ? Eat only one serving each day of meat, poultry, fish, or seafood. ? When you prepare animal protein, cut pieces into small portion sizes. For most meat and fish, one serving is about the size of one deck of cards.  Eat at least 5 servings of fresh fruits and vegetables each day. To do this: ? Keep fruits and vegetables on hand for snacks. ? Eat 1 piece of fruit or a handful of berries with breakfast. ? Have a salad and fruit at lunch. ? Have two kinds of vegetables at dinner.  Limit foods that are high in a substance called oxalate. These include: ? Spinach. ? Rhubarb. ? Beets. ? Potato chips and french fries. ? Nuts.  If you regularly take a diuretic medicine, make sure to eat at least 1-2 fruits or vegetables high in potassium each day. These include: ? Avocado. ? Banana. ? Orange, prune, carrot, or tomato juice. ? Baked potato. ? Cabbage. ? Beans and split peas.   General instructions  Drink enough fluid to keep your urine clear or pale yellow. This is the most important thing you can do.  Talk to your health care provider and dietitian about taking daily supplements. Depending on your health and the cause of your kidney stones, you may be advised: ? Not to take supplements with vitamin C. ? To take a calcium supplement. ? To take a daily probiotic supplement. ? To take other supplements such as magnesium, fish oil, or vitamin B6.  Take all medicines and supplements as told by your health care provider.  Limit alcohol intake to no more  than 1 drink a day for nonpregnant women and 2 drinks a day for men. One drink equals 12 oz of beer, 5 oz of wine, or 1 oz of hard liquor.  Lose weight if told by your health care provider. Work with your dietitian to find strategies and an eating plan that works best for you. What foods are not recommended? Limit your intake of the following foods, or as told by your dietitian. Talk to your dietitian about specific foods you should avoid based on the type of kidney stones and your overall health. Grains Breads. Bagels. Rolls. Baked goods. Salted crackers. Cereal. Pasta. Vegetables Spinach. Rhubarb. Beets. Canned vegetables. Pickles. Olives. Meats and other protein foods Nuts. Nut butters. Large portions of meat, poultry, or fish. Salted or cured meats. Deli meats. Hot dogs. Sausages. Dairy Cheese. Beverages Regular soft drinks. Regular vegetable juice. Seasonings and other foods Seasoning blends with salt. Salad dressings. Canned soups. Soy sauce. Ketchup. Barbecue sauce. Canned pasta sauce. Casseroles. Pizza. Lasagna. Frozen meals. Potato chips. French fries. Summary  You can reduce your risk of kidney stones by making changes to your diet.  The most important thing you can do is drink enough fluid. You should drink enough fluid to keep your urine clear or pale yellow.  Ask your health care provider or dietitian how much protein from animal sources you should eat each day, and also how much salt and calcium you should have each day. This information is not intended to replace advice given to you by your health care provider. Make sure you discuss any questions you have with your health care provider. Document Released: 10/30/2010 Document Revised: 06/15/2016 Document Reviewed: 06/15/2016 Elsevier Interactive Patient Education  2018 [employer] Kidney Stones Kidney stones (urolithiasis) are solid, rock-like deposits that form inside of the organs that make urine (kidneys). A kidney  stone may form in a kidney and move into the bladder, where it can cause intense pain and block the flow of urine. Kidney stones are created when high levels of certain minerals are found in the urine. They are usually passed through urination, but in some cases, medical treatment may be needed to remove them. What are the causes? Kidney stones may be caused by:  A condition in which certain glands produce too much parathyroid hormone (primary hyperparathyroidism), which causes too much calcium buildup in the blood.  Buildup of uric acid crystals in the bladder (hyperuricosuria). Uric acid is a chemical that the body produces when you eat certain foods. It usually exits the body in the urine.  Narrowing (stricture) of one or both of the tubes that drain urine from the kidneys to the bladder (ureters).  A kidney blockage that is present at birth (congenital obstruction).  Past surgery on the kidney or the ureters, such as gastric bypass surgery.  What increases the risk? The following factors make you more   likely to develop kidney stones:  Having had a kidney stone in the past.  Having a family history of kidney stones.  Not drinking enough water.  Eating a diet that is high in protein, salt (sodium), or sugar.  Being overweight or obese.  What are the signs or symptoms? Symptoms of a kidney stone may include:  Nausea.  Vomiting.  Blood in the urine (hematuria).  Pain in the side of the abdomen, right below the ribs (flank pain). Pain usually spreads (radiates) to the groin.  Needing to urinate frequently or urgently.  How is this diagnosed? This condition may be diagnosed based on:  Your medical history.  A physical exam.  Blood tests.  Urine tests.  CT scan.  Abdominal X-ray.  A procedure to examine the inside of the bladder (cystoscopy).  How is this treated? Treatment for kidney stones depends on the size, location, and makeup of the stones. Treatment may  involve:  Analyzing your urine before and after you pass the stone through urination.  Being monitored at the hospital until you pass the stone through urination.  Increasing your fluid intake and decreasing the amount of calcium and protein in your diet.  A procedure to break up kidney stones in the bladder using: ? A focused beam of light (laser therapy). ? Shock waves (extracorporeal shock wave lithotripsy).  Surgery to remove kidney stones. This may be needed if you have severe pain or have stones that block your urinary tract.  Follow these instructions at home: Eating and drinking   Drink enough fluid to keep your urine clear or pale yellow. This will help you to pass the kidney stone.  If directed, change your diet. This may include: ? Limiting how much sodium you eat. ? Eating more fruits and vegetables. ? Limiting how much meat, poultry, fish, and eggs you eat.  Follow instructions from your health care provider about eating or drinking restrictions. General instructions  Collect urine samples as told by your health care provider. You may need to collect a urine sample: ? 24 hours after you pass the stone. ? 8-12 weeks after passing the kidney stone, and every 6-12 months after that.  Strain your urine every time you urinate, for as long as directed. Use the strainer that your health care provider recommends.  Do not throw out the kidney stone after passing it. Keep the stone so it can be tested by your health care provider. Testing the makeup of your kidney stone may help prevent you from getting kidney stones in the future.  Take over-the-counter and prescription medicines only as told by your health care provider.  Keep all follow-up visits as told by your health care provider. This is important. You may need follow-up X-rays or ultrasounds to make sure that your stone has passed. How is this prevented? To prevent another kidney stone:  Drink enough fluid to keep  your urine clear or pale yellow. This is the best way to prevent kidney stones.  Eat a healthy diet and follow recommendations from your health care provider about foods to avoid. You may be instructed to eat a low-protein diet. Recommendations vary depending on the type of kidney stone that you have.  Maintain a healthy weight.  Contact a health care provider if:  You have pain that gets worse or does not get better with medicine. Get help right away if:  You have a fever or chills.  You develop severe pain.  You develop new abdominal   pain.  You faint.  You are unable to urinate. This information is not intended to replace advice given to you by your health care provider. Make sure you discuss any questions you have with your health care provider. Document Released: 07/05/2005 Document Revised: 01/23/2016 Document Reviewed: 12/19/2015 Elsevier Interactive Patient Education  2018 [employer]  

## 2017-10-14 DIAGNOSIS — G4733 Obstructive sleep apnea (adult) (pediatric): Secondary | ICD-10-CM | POA: Diagnosis not present

## 2017-11-14 DIAGNOSIS — G4733 Obstructive sleep apnea (adult) (pediatric): Secondary | ICD-10-CM | POA: Diagnosis not present

## 2017-12-09 ENCOUNTER — Encounter: Payer: Self-pay | Admitting: Family Medicine

## 2017-12-14 DIAGNOSIS — G4733 Obstructive sleep apnea (adult) (pediatric): Secondary | ICD-10-CM | POA: Diagnosis not present

## 2018-01-14 DIAGNOSIS — G4733 Obstructive sleep apnea (adult) (pediatric): Secondary | ICD-10-CM | POA: Diagnosis not present

## 2018-02-13 DIAGNOSIS — G4733 Obstructive sleep apnea (adult) (pediatric): Secondary | ICD-10-CM | POA: Diagnosis not present

## 2018-02-21 ENCOUNTER — Ambulatory Visit: Payer: Medicare Other | Admitting: Family Medicine

## 2018-02-24 ENCOUNTER — Ambulatory Visit (INDEPENDENT_AMBULATORY_CARE_PROVIDER_SITE_OTHER): Payer: Medicare Other | Admitting: Family Medicine

## 2018-02-24 ENCOUNTER — Encounter: Payer: Self-pay | Admitting: Family Medicine

## 2018-02-24 VITALS — BP 131/78 | HR 76 | Temp 98.6°F | Wt 211.3 lb

## 2018-02-24 DIAGNOSIS — B182 Chronic viral hepatitis C: Secondary | ICD-10-CM

## 2018-02-24 DIAGNOSIS — I10 Essential (primary) hypertension: Secondary | ICD-10-CM | POA: Diagnosis not present

## 2018-02-24 DIAGNOSIS — J449 Chronic obstructive pulmonary disease, unspecified: Secondary | ICD-10-CM

## 2018-02-24 DIAGNOSIS — R7301 Impaired fasting glucose: Secondary | ICD-10-CM

## 2018-02-24 DIAGNOSIS — N529 Male erectile dysfunction, unspecified: Secondary | ICD-10-CM

## 2018-02-24 LAB — MICROALBUMIN, URINE WAIVED
CREATININE, URINE WAIVED: 200 mg/dL (ref 10–300)
MICROALB, UR WAIVED: 80 mg/L — AB (ref 0–19)
Microalb/Creat Ratio: <30 mg/g (ref <30)

## 2018-02-24 LAB — BAYER DCA HB A1C WAIVED: HB A1C (BAYER DCA - WAIVED): 5.6 % (ref <7.0)

## 2018-02-24 MED ORDER — ASPIRIN 81 MG PO TBEC: 81.0000 mg | DELAYED_RELEASE_TABLET | Freq: Every day | ORAL | 3 refills | Status: DC

## 2018-02-24 MED ORDER — SILDENAFIL CITRATE 20 MG PO TABS: ORAL_TABLET | ORAL | 12 refills | Status: DC

## 2018-02-24 MED ORDER — BENAZEPRIL HCL 40 MG PO TABS: 40.0000 mg | ORAL_TABLET | Freq: Every day | ORAL | 1 refills | Status: DC

## 2018-02-24 MED ORDER — AMLODIPINE BESYLATE 10 MG PO TABS: 10.0000 mg | ORAL_TABLET | Freq: Every day | ORAL | 1 refills | Status: DC

## 2018-02-24 NOTE — Assessment & Plan Note (Signed)
Under good control on current regimen. Continue current regimen. Continue to monitor. Call with any concerns. Refills given.   

## 2018-02-24 NOTE — Assessment & Plan Note (Signed)
Has been treated. Feeling well. Will check levels at physical.

## 2018-02-24 NOTE — Assessment & Plan Note (Signed)
Under good control on current regimen. Refills given. Call with any concerns.

## 2018-02-24 NOTE — Assessment & Plan Note (Addendum)
Under good control with A1c at 5.6. Continue diet and exercise. Continue to monitor. Call with any concerns.

## 2018-02-24 NOTE — Progress Notes (Signed)
BP 131/78 (BP Location: Left Arm, Patient Position: Sitting, Cuff Size: Normal)   Pulse 76   Temp 98.6 F (37 C)   Wt 211 lb 5 oz (95.9 kg)   SpO2 97%   BMI 29.47 kg/m    Subjective:    Patient ID: Nathan Cooper, male    DOB: 1945/11/20, 72 y.o.   MRN: [id number]  HPI: Nathan Cooper is a 72 y.o. male  Chief Complaint  Patient presents with  . Hypertension  . IFG   HYPERTENSION Hypertension status: controlled  Satisfied with current treatment? yes Duration of hypertension: chronic BP monitoring frequency:  not checking BP medication side effects:  no Medication compliance: excellent compliance Previous BP meds: amlodipine, benazepril Aspirin: no Recurrent headaches: no Visual changes: no Palpitations: no Dyspnea: no Chest pain: no Lower extremity edema: no Dizzy/lightheaded: no  Impaired Fasting Glucose HbA1C:  Lab Results  Component Value Date   HGBA1C 6.1 (H) 07/17/2015   Duration of elevated blood sugar: chronic Polydipsia: no Polyuria: no Weight change: no Visual disturbance: no Glucose Monitoring: no  Diabetic Education: Not Completed Family history of diabetes: yes  Relevant past medical, surgical, family and social history reviewed and updated as indicated. Interim medical history since our last visit reviewed. Allergies and medications reviewed and updated.  Review of Systems  Constitutional: Negative.   Respiratory: Negative.   Cardiovascular: Negative.   Musculoskeletal: Negative.   Psychiatric/Behavioral: Negative.     Per HPI unless specifically indicated above     Objective:    BP 131/78 (BP Location: Left Arm, Patient Position: Sitting, Cuff Size: Normal)   Pulse 76   Temp 98.6 F (37 C)   Wt 211 lb 5 oz (95.9 kg)   SpO2 97%   BMI 29.47 kg/m   Wt Readings from Last 3 Encounters:  02/24/18 211 lb 5 oz (95.9 kg)  09/29/17 213 lb 14.4 oz (97 kg)  09/21/17 213 lb (96.6 kg)    Physical Exam  Constitutional: He is  oriented to person, place, and time. He appears well-developed and well-nourished. No distress.  HENT:  Head: Normocephalic and atraumatic.  Right Ear: Hearing normal.  Left Ear: Hearing normal.  Nose: Nose normal.  Eyes: Conjunctivae and lids are normal. Right eye exhibits no discharge. Left eye exhibits no discharge. No scleral icterus.  Cardiovascular: Normal rate, regular rhythm, normal heart sounds and intact distal pulses. Exam reveals no gallop and no friction rub.  No murmur heard. Pulmonary/Chest: Effort normal and breath sounds normal. No stridor. No respiratory distress. He has no wheezes. He has no rales. He exhibits no tenderness.  Musculoskeletal: Normal range of motion.  Neurological: He is alert and oriented to person, place, and time.  Skin: Skin is warm, dry and intact. Capillary refill takes less than 2 seconds. No rash noted. He is not diaphoretic. No erythema. No pallor.  Psychiatric: He has a normal mood and affect. His speech is normal and behavior is normal. Judgment and thought content normal. Cognition and memory are normal.  Nursing note and vitals reviewed.   Results for orders placed or performed in visit on 09/29/17  Microscopic Examination  Result Value Ref Range   WBC, UA 0-5 0 - 5 /hpf   RBC, UA 0-2 0 - 2 /hpf   Epithelial Cells (non renal) CANCELED    Bacteria, UA None seen None seen/Few  UA/M w/rflx Culture, Routine  Result Value Ref Range   Specific Gravity, UA 1.015 1.005 - 1.030  pH, UA 7.0 5.0 - 7.5   Color, UA Yellow Yellow   Appearance Ur Clear Clear   Leukocytes, UA Negative Negative   Protein, UA Negative Negative/Trace   Glucose, UA Negative Negative   Ketones, UA Negative Negative   RBC, UA Trace (A) Negative   Bilirubin, UA Negative Negative   Urobilinogen, Ur 0.2 0.2 - 1.0 mg/dL   Nitrite, UA Negative Negative   Microscopic Examination See below:       Assessment & Plan:   Problem List Items Addressed This Visit       Cardiovascular and Mediastinum   HTN (hypertension) - Primary    Under good control on current regimen. Continue current regimen. Continue to monitor. Call with any concerns. Refills given.       Relevant Medications   amLODipine (NORVASC) 10 MG tablet   benazepril (LOTENSIN) 40 MG tablet   aspirin 81 MG EC tablet   sildenafil (REVATIO) 20 MG tablet   Other Relevant Orders   Comprehensive metabolic panel   Microalbumin, Urine Waived     Respiratory   COPD (chronic obstructive pulmonary disease) (HCC)    Under good control on current regimen. Continue current regimen. Continue to monitor. Call with any concerns. Refills given.       Relevant Orders   Comprehensive metabolic panel     Digestive   Hep C w/o coma, chronic (HCC)    Has been treated. Feeling well. Will check levels at physical.       Relevant Orders   Comprehensive metabolic panel     Endocrine   IFG (impaired fasting glucose)    Under good control with A1c at 5.6. Continue diet and exercise. Continue to monitor. Call with any concerns.       Relevant Orders   Bayer DCA Hb A1c Waived   Comprehensive metabolic panel   Microalbumin, Urine Waived     Genitourinary   ED (erectile dysfunction)    Under good control on current regimen. Refills given. Call with any concerns.       Relevant Medications   sildenafil (REVATIO) 20 MG tablet       Follow up plan: Return in about 6 months (around 08/27/2018) for Physical/wellness.

## 2018-02-25 LAB — COMPREHENSIVE METABOLIC PANEL
ALT: 11 IU/L (ref 0–44)
AST: 16 IU/L (ref 0–40)
Albumin/Globulin Ratio: 1.7 (ref 1.2–2.2)
Albumin: 4.8 g/dL (ref 3.5–4.8)
Alkaline Phosphatase: 63 IU/L (ref 39–117)
BUN/Creatinine Ratio: 14 (ref 10–24)
BUN: 16 mg/dL (ref 8–27)
Bilirubin Total: 0.4 mg/dL (ref 0.0–1.2)
CO2: 19 mmol/L — ABNORMAL LOW (ref 20–29)
Calcium: 9.4 mg/dL (ref 8.6–10.2)
Chloride: 102 mmol/L (ref 96–106)
Creatinine, Ser: 1.12 mg/dL (ref 0.76–1.27)
GFR calc Af Amer: 77 mL/min/{1.73_m2} (ref >59)
GFR calc non Af Amer: 66 mL/min/{1.73_m2} (ref >59)
Globulin, Total: 2.8 g/dL (ref 1.5–4.5)
Glucose: 82 mg/dL (ref 65–99)
Potassium: 4.6 mmol/L (ref 3.5–5.2)
Sodium: 139 mmol/L (ref 134–144)
Total Protein: 7.6 g/dL (ref 6.0–8.5)

## 2018-03-16 DIAGNOSIS — G4733 Obstructive sleep apnea (adult) (pediatric): Secondary | ICD-10-CM | POA: Diagnosis not present

## 2018-04-16 DIAGNOSIS — G4733 Obstructive sleep apnea (adult) (pediatric): Secondary | ICD-10-CM | POA: Diagnosis not present

## 2018-05-16 DIAGNOSIS — G4733 Obstructive sleep apnea (adult) (pediatric): Secondary | ICD-10-CM | POA: Diagnosis not present

## 2018-06-16 DIAGNOSIS — G4733 Obstructive sleep apnea (adult) (pediatric): Secondary | ICD-10-CM | POA: Diagnosis not present

## 2018-07-16 DIAGNOSIS — G4733 Obstructive sleep apnea (adult) (pediatric): Secondary | ICD-10-CM | POA: Diagnosis not present

## 2018-08-25 ENCOUNTER — Telehealth: Payer: Self-pay

## 2018-08-25 ENCOUNTER — Ambulatory Visit: Payer: Self-pay

## 2018-08-25 NOTE — Telephone Encounter (Signed)
Call pt regarding lung screening. Left message for pt to return call.  

## 2018-08-28 ENCOUNTER — Ambulatory Visit (INDEPENDENT_AMBULATORY_CARE_PROVIDER_SITE_OTHER): Payer: Medicare Other

## 2018-08-28 ENCOUNTER — Encounter: Payer: Self-pay | Admitting: Family Medicine

## 2018-08-28 ENCOUNTER — Ambulatory Visit (INDEPENDENT_AMBULATORY_CARE_PROVIDER_SITE_OTHER): Payer: Medicare Other | Admitting: Family Medicine

## 2018-08-28 VITALS — BP 116/64 | HR 86 | Temp 98.1°F | Resp 16 | Ht 71.0 in | Wt 217.3 lb

## 2018-08-28 DIAGNOSIS — I10 Essential (primary) hypertension: Secondary | ICD-10-CM | POA: Diagnosis not present

## 2018-08-28 DIAGNOSIS — B182 Chronic viral hepatitis C: Secondary | ICD-10-CM | POA: Diagnosis not present

## 2018-08-28 DIAGNOSIS — R7301 Impaired fasting glucose: Secondary | ICD-10-CM

## 2018-08-28 DIAGNOSIS — I700 Atherosclerosis of aorta: Secondary | ICD-10-CM | POA: Diagnosis not present

## 2018-08-28 DIAGNOSIS — Z0000 Encounter for general adult medical examination without abnormal findings: Secondary | ICD-10-CM

## 2018-08-28 DIAGNOSIS — I25118 Atherosclerotic heart disease of native coronary artery with other forms of angina pectoris: Secondary | ICD-10-CM | POA: Diagnosis not present

## 2018-08-28 DIAGNOSIS — Z23 Encounter for immunization: Secondary | ICD-10-CM | POA: Diagnosis not present

## 2018-08-28 DIAGNOSIS — Z125 Encounter for screening for malignant neoplasm of prostate: Secondary | ICD-10-CM

## 2018-08-28 DIAGNOSIS — Z720 Tobacco use: Secondary | ICD-10-CM

## 2018-08-28 DIAGNOSIS — N529 Male erectile dysfunction, unspecified: Secondary | ICD-10-CM

## 2018-08-28 DIAGNOSIS — J449 Chronic obstructive pulmonary disease, unspecified: Secondary | ICD-10-CM

## 2018-08-28 LAB — UA/M W/RFLX CULTURE, ROUTINE
Bilirubin, UA: NEGATIVE
GLUCOSE, UA: NEGATIVE
Ketones, UA: NEGATIVE
Leukocytes, UA: NEGATIVE
Nitrite, UA: NEGATIVE
Protein, UA: NEGATIVE
RBC, UA: NEGATIVE
Specific Gravity, UA: 1.02 (ref 1.005–1.030)
Urobilinogen, Ur: 0.2 mg/dL (ref 0.2–1.0)
pH, UA: 5.5 (ref 5.0–7.5)

## 2018-08-28 LAB — MICROALBUMIN, URINE WAIVED
CREATININE, URINE WAIVED: 200 mg/dL (ref 10–300)
MICROALB, UR WAIVED: 10 mg/L (ref 0–19)
Microalb/Creat Ratio: <30 mg/g (ref <30)

## 2018-08-28 LAB — BAYER DCA HB A1C WAIVED: HB A1C (BAYER DCA - WAIVED): 5.6 % (ref <7.0)

## 2018-08-28 MED ORDER — SILDENAFIL CITRATE 20 MG PO TABS: ORAL_TABLET | ORAL | 12 refills | Status: DC

## 2018-08-28 MED ORDER — ASPIRIN 81 MG PO TBEC: 81.0000 mg | DELAYED_RELEASE_TABLET | Freq: Every day | ORAL | 3 refills | Status: DC

## 2018-08-28 MED ORDER — BENAZEPRIL HCL 40 MG PO TABS: 40.0000 mg | ORAL_TABLET | Freq: Every day | ORAL | 1 refills | Status: DC

## 2018-08-28 MED ORDER — AMLODIPINE BESYLATE 10 MG PO TABS: 10.0000 mg | ORAL_TABLET | Freq: Every day | ORAL | 1 refills | Status: DC

## 2018-08-28 MED ORDER — CHANTIX CONTINUING MONTH PAK 1 MG PO TABS: ORAL_TABLET | ORAL | 1 refills | Status: DC

## 2018-08-28 NOTE — Assessment & Plan Note (Signed)
Under good control on current regimen. Continue current regimen. Continue to monitor. Call with any concerns. Refills given.   

## 2018-08-28 NOTE — Assessment & Plan Note (Signed)
Under good control on current regimen. Continue current regimen. Continue to monitor. Call with any concerns. Refills given. Labs checked today.  

## 2018-08-28 NOTE — Progress Notes (Signed)
BP 116/64   Pulse 86   Temp 98.1 F (36.7 C) (Temporal)   Resp 16   Ht 5\' 11"  (1.803 m)   Wt 217 lb 4.8 oz (98.6 kg)   BMI 30.31 kg/m    Subjective:    Patient ID: Nathan Cooper, male    DOB: 09/05/1945, 73 y.o.   MRN: [id number]  HPI: Nathan Cooper is a 73 y.o. male presenting on 08/28/2018 for comprehensive medical examination. Current medical complaints include:  HYPERTENSION Hypertension status: better  Satisfied with current treatment? yes Duration of hypertension: chronic BP monitoring frequency:  not checking BP medication side effects:  no Medication compliance: excellent compliance Previous BP meds: amlodipine, benazepril Aspirin: yes Recurrent headaches: no Visual changes: no Palpitations: no Dyspnea: no Chest pain: no Lower extremity edema: no Dizzy/lightheaded: no  Impaired Fasting Glucose HbA1C:  Lab Results  Component Value Date   HGBA1C 5.6 02/24/2018   Duration of elevated blood sugar: chronic Polydipsia: no Polyuria: no Weight change: no Visual disturbance: no Glucose Monitoring: no Diabetic Education: Completed Family history of diabetes: no  Interim Problems from his last visit: no  Depression Screen done today and results listed below:  Depression screen [facility] 2/9 08/28/2018 08/24/2017 08/17/2016 07/20/2015 12/25/2014  Decreased Interest 0 0 0 0 0  Down, Depressed, Hopeless 0 0 0 0 0  PHQ - 2 Score 0 0 0 0 0  Altered sleeping - - - 0 -  Tired, decreased energy - - - 0 -  Change in appetite - - - 2 -  Feeling bad or failure about yourself  - - - 0 -  Trouble concentrating - - - 0 -  Moving slowly or fidgety/restless - - - 0 -  Suicidal thoughts - - - 0 -  PHQ-9 Score - - - 2 -  Difficult doing work/chores - - - Somewhat difficult -    Past Medical History:  Past Medical History:  Diagnosis Date  . COPD (chronic obstructive pulmonary disease) ([facility])   . Coronary artery disease   . Hepatitis C   . Hernia, inguinal 2014  .  Hypertension 2009  . IFG (impaired fasting glucose) 07/22/2015  . Personal history of tobacco use, presenting hazards to health 08/26/2015  . Sleep apnea     Surgical History:  Past Surgical History:  Procedure Laterality Date  . COLONOSCOPY  2010  . COLONOSCOPY WITH PROPOFOL N/A 09/21/2017   Procedure: COLONOSCOPY WITH PROPOFOL;  Surgeon: Virgel Manifold, MD;  Location: [facility] ENDOSCOPY;  Service: Endoscopy;  Laterality: N/A;  . HERNIA REPAIR  2014    Medications:  Current Outpatient Medications on File Prior to Visit  Medication Sig  . triamcinolone ointment (KENALOG) 0.5 % Apply 1 application topically 2 (two) times daily.  . valACYclovir (VALTREX) 500 MG tablet Take 500 mg by mouth 2 (two) times daily as needed.    No current facility-administered medications on file prior to visit.     Allergies:  No Known Allergies  Social History:  Social History   Socioeconomic History  . Marital status: Divorced    Spouse name: Not on file  . Number of children: Not on file  . Years of education: Not on file  . Highest education level: Associate degree: academic program  Occupational History  . Not on file  Social Needs  . Financial resource strain: Not hard at all  . Food insecurity:    Worry: Never true    Inability: Never true  .  Transportation needs:    Medical: No    Non-medical: No  Tobacco Use  . Smoking status: Current Every Day Smoker    Packs/day: 1.00    Years: 45.00    Pack years: 45.00    Types: Cigarettes  . Smokeless tobacco: Never Used  . Tobacco comment: has had 5 since jan 2019  Substance and Sexual Activity  . Alcohol use: No    Alcohol/week: 0.0 standard drinks  . Drug use: No  . Sexual activity: Not on file  Lifestyle  . Physical activity:    Days per week: 0 days    Minutes per session: 0 min  . Stress: Not at all  Relationships  . Social connections:    Talks on phone: More than three times a week    Gets together: More than three times a  week    Attends religious service: More than 4 times per year    Active member of club or organization: Yes    Attends meetings of clubs or organizations: More than 4 times per year    Relationship status: Divorced  . Intimate partner violence:    Fear of current or ex partner: No    Emotionally abused: No    Physically abused: No    Forced sexual activity: No  Other Topics Concern  . 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 Packs/day: 1.00  . Years: 45.00  . Pack years: 45.00  . Types: Cigarettes  Smokeless Tobacco Never Used  Tobacco Comment   has had 5 since jan 2019   Social History   Substance and Sexual Activity  Alcohol Use No  . Alcohol/week: 0.0 standard drinks    Family History:  Family History  Problem Relation Age of Onset  . Hypertension Mother   . Cirrhosis Father   . Gout Son   . Heart disease Maternal Uncle 33  . Heart attack Maternal Uncle   . Cancer Paternal Uncle   . Diabetes Paternal Grandmother     Past medical history, surgical history, medications, allergies, family history and social history reviewed with patient today and changes made to appropriate areas of the chart.   Review of Systems  Constitutional: Negative.   HENT: Negative.   Eyes: Negative.   Respiratory: Positive for shortness of breath. Negative for cough, hemoptysis, sputum production and wheezing.   Cardiovascular: Negative.   Gastrointestinal: Negative.   Genitourinary: Negative.   Musculoskeletal: Negative.   Skin: Negative.     All other ROS negative except what is listed above and in the HPI.      Objective:    BP 116/64   Pulse 86   Temp 98.1 F (36.7 C) (Temporal)   Resp 16   Ht 5\' 11"  (1.803 m)   Wt 217 lb 4.8 oz (98.6 kg)   BMI 30.31 kg/m   Wt Readings from Last 3  Encounters:  08/28/18 217 lb 4.8 oz (98.6 kg)  08/28/18 217 lb 4.8 oz (98.6 kg)  02/24/18 211 lb 5 oz (95.9 kg)    Physical Exam Vitals signs and nursing note reviewed.  Constitutional:      General: He is not in acute distress.    Appearance: Normal appearance. He is obese. He  is not ill-appearing, toxic-appearing or diaphoretic.  HENT:     Head: Normocephalic and atraumatic.     Right Ear: Tympanic membrane, ear canal and external ear normal. There is no impacted cerumen.     Left Ear: Tympanic membrane, ear canal and external ear normal. There is no impacted cerumen.     Nose: Nose normal. No congestion or rhinorrhea.     Mouth/Throat:     Mouth: Mucous membranes are moist.     Pharynx: Oropharynx is clear. No oropharyngeal exudate or posterior oropharyngeal erythema.  Eyes:     General: No scleral icterus.       Right eye: No discharge.        Left eye: No discharge.     Extraocular Movements: Extraocular movements intact.     Conjunctiva/sclera: Conjunctivae normal.     Pupils: Pupils are equal, round, and reactive to light.  Neck:     Musculoskeletal: Normal range of motion and neck supple. No neck rigidity or muscular tenderness.     Vascular: No carotid bruit.  Cardiovascular:     Rate and Rhythm: Normal rate and regular rhythm.     Pulses: Normal pulses.     Heart sounds: No murmur. No friction rub. No gallop.   Pulmonary:     Effort: Pulmonary effort is normal. No respiratory distress.     Breath sounds: Normal breath sounds. No stridor. No wheezing, rhonchi or rales.  Chest:     Chest wall: No tenderness.  Abdominal:     General: Abdomen is flat. Bowel sounds are normal. There is no distension.     Palpations: Abdomen is soft. There is no mass.     Tenderness: There is no abdominal tenderness. There is no right CVA tenderness, left CVA tenderness, guarding or rebound.     Hernia: No hernia is present.  Genitourinary:    Comments: Genital exam deferred with shared  decision making Musculoskeletal:        General: No swelling, tenderness, deformity or signs of injury.     Right lower leg: No edema.     Left lower leg: No edema.  Lymphadenopathy:     Cervical: No cervical adenopathy.  Skin:    General: Skin is warm and dry.     Capillary Refill: Capillary refill takes less than 2 seconds.     Coloration: Skin is not jaundiced or pale.     Findings: No bruising, erythema, lesion or rash.  Neurological:     General: No focal deficit present.     Mental Status: He is alert and oriented to person, place, and time.     Cranial Nerves: No cranial nerve deficit.     Sensory: No sensory deficit.     Motor: No weakness.     Coordination: Coordination normal.     Gait: Gait normal.     Deep Tendon Reflexes: Reflexes normal.  Psychiatric:        Mood and Affect: Mood normal.        Behavior: Behavior normal.        Thought Content: Thought content normal.        Judgment: Judgment normal.     Results for orders placed or performed in visit on 02/24/18  Bayer DCA Hb A1c Waived  Result Value Ref Range   HB A1C (BAYER DCA - WAIVED) 5.6 <7.0 %  Comprehensive metabolic panel  Result Value Ref Range   Glucose 82 65 - 99 mg/dL   BUN  16 8 - 27 mg/dL   Creatinine, Ser 1.12 0.76 - 1.27 mg/dL   GFR calc non Af Amer 66 >59 mL/min/1.73   GFR calc Af Amer 77 >59 mL/min/1.73   BUN/Creatinine Ratio 14 10 - 24   Sodium 139 134 - 144 mmol/L   Potassium 4.6 3.5 - 5.2 mmol/L   Chloride 102 96 - 106 mmol/L   CO2 19 (L) 20 - 29 mmol/L   Calcium 9.4 8.6 - 10.2 mg/dL   Total Protein 7.6 6.0 - 8.5 g/dL   Albumin 4.8 3.5 - 4.8 g/dL   Globulin, Total 2.8 1.5 - 4.5 g/dL   Albumin/Globulin Ratio 1.7 1.2 - 2.2   Bilirubin Total 0.4 0.0 - 1.2 mg/dL   Alkaline Phosphatase 63 39 - 117 IU/L   AST 16 0 - 40 IU/L   ALT 11 0 - 44 IU/L  Microalbumin, Urine Waived  Result Value Ref Range   Microalb, Ur Waived 80 (H) 0 - 19 mg/L   Creatinine, Urine Waived 200 10 - 300  mg/dL   Microalb/Creat Ratio <30 <30 mg/g      Assessment & Plan:   Problem List Items Addressed This Visit      Cardiovascular and Mediastinum   HTN (hypertension)    Under good control on current regimen. Continue current regimen. Continue to monitor. Call with any concerns. Refills given. Labs checked today.       Relevant Medications   amLODipine (NORVASC) 10 MG tablet   benazepril (LOTENSIN) 40 MG tablet   aspirin 81 MG EC tablet   sildenafil (REVATIO) 20 MG tablet   Other Relevant Orders   CBC with Differential/Platelet   Comprehensive metabolic panel   Microalbumin, Urine Waived   TSH   UA/M w/rflx Culture, Routine   CAD (coronary artery disease)    Will keep BP, sugars and cholesterol under good control. Continue current regimen. Continue to monitor. Call with any concerns.       Relevant Medications   amLODipine (NORVASC) 10 MG tablet   benazepril (LOTENSIN) 40 MG tablet   aspirin 81 MG EC tablet   sildenafil (REVATIO) 20 MG tablet   Other Relevant Orders   CBC with Differential/Platelet   Comprehensive metabolic panel   Lipid Panel w/o Chol/HDL Ratio   TSH   UA/M w/rflx Culture, Routine   Atherosclerosis of aorta (HCC)    Will keep BP, sugars and cholesterol under good control. Continue current regimen. Continue to monitor. Call with any concerns.       Relevant Medications   amLODipine (NORVASC) 10 MG tablet   benazepril (LOTENSIN) 40 MG tablet   aspirin 81 MG EC tablet   sildenafil (REVATIO) 20 MG tablet   Other Relevant Orders   CBC with Differential/Platelet   Comprehensive metabolic panel   Lipid Panel w/o Chol/HDL Ratio   TSH   UA/M w/rflx Culture, Routine     Respiratory   COPD (chronic obstructive pulmonary disease) (HCC)    Under good control on current regimen. Continue current regimen. Continue to monitor. Call with any concerns. Refills given. Labs checked today.      Relevant Medications   CHANTIX CONTINUING MONTH PAK 1 MG tablet    Other Relevant Orders   CBC with Differential/Platelet   Comprehensive metabolic panel   TSH   UA/M w/rflx Culture, Routine     Digestive   Hep C w/o coma, chronic (HCC)    Has been treated. Doing well. Checking labs again today.  Relevant Orders   CBC with Differential/Platelet   Comprehensive metabolic panel   TSH   UA/M w/rflx Culture, Routine   HCV RNA quant     Endocrine   RESOLVED: IFG (impaired fasting glucose)    Under good control with A1c of 5.6. Continue diet and exercise. Continue to monitor. Call with any concerns. Has not had IFG range sugar in 2 years. Resolving to history.      Relevant Orders   Bayer DCA Hb A1c Waived   CBC with Differential/Platelet   Comprehensive metabolic panel   TSH   UA/M w/rflx Culture, Routine     Other   Tobacco abuse    Smoking again. Could not afford his chantix- will send it through again. Call with any concerns. Continue to monitor.       Relevant Orders   CBC with Differential/Platelet   Comprehensive metabolic panel   TSH   UA/M w/rflx Culture, Routine   ED (erectile dysfunction)    Under good control on current regimen. Continue current regimen. Continue to monitor. Call with any concerns. Refills given.       Relevant Medications   sildenafil (REVATIO) 20 MG tablet    Other Visit Diagnoses    Routine general medical examination at a health care facility    -  Primary   Vaccines up to date. Screening labs checked today. Colonoscopy up to date. Continue diet and exercise. Call with any concerns.    Screening for prostate cancer       Labs drawn today. Await results.   Relevant Orders   PSA       Discussed aspirin prophylaxis for myocardial infarction prevention and decision was made to continue ASA  LABORATORY TESTING:  Health maintenance labs ordered today as discussed above.   The natural history of prostate cancer and ongoing controversy regarding screening and potential treatment outcomes of  prostate cancer has been discussed with the patient. The meaning of a false positive PSA and a false negative PSA has been discussed. He indicates understanding of the limitations of this screening test and wishes to proceed with screening PSA testing.   IMMUNIZATIONS:   - Tdap: Tetanus vaccination status reviewed: last tetanus booster within 10 years. - Influenza: Up to date - Pneumovax: Up to date - Prevnar: Up to date - HPV: Not applicable - Zostavax vaccine: Not applicable  SCREENING: - Colonoscopy: Up to date  Discussed with patient purpose of the colonoscopy is to detect colon cancer at curable precancerous or early stages   PATIENT COUNSELING:    Sexuality: Discussed sexually transmitted diseases, partner selection, use of condoms, avoidance of unintended pregnancy  and contraceptive alternatives.   Advised to avoid cigarette smoking.  I discussed with the patient that most people either abstain from alcohol or drink within safe limits (<=14/week and <=4 drinks/occasion for males, <=7/weeks and <= 3 drinks/occasion for females) and that the risk for alcohol disorders and other health effects rises proportionally with the number of drinks per week and how often a drinker exceeds daily limits.  Discussed cessation/primary prevention of drug use and availability of treatment for abuse.   Diet: Encouraged to adjust caloric intake to maintain  or achieve ideal body weight, to reduce intake of dietary saturated fat and total fat, to limit sodium intake by avoiding high sodium foods and not adding table salt, and to maintain adequate dietary potassium and calcium preferably from fresh fruits, vegetables, and low-fat dairy products.    stressed the importance  of regular exercise  Injury prevention: Discussed safety belts, safety helmets, smoke detector, smoking near bedding or upholstery.   Dental health: Discussed importance of regular tooth brushing, flossing, and dental visits.    Follow up plan: NEXT PREVENTATIVE PHYSICAL DUE IN 1 YEAR. Return in about 6 months (around 02/26/2019) for Follow up.

## 2018-08-28 NOTE — Assessment & Plan Note (Signed)
Will keep BP, sugars and cholesterol under good control. Continue current regimen. Continue to monitor. Call with any concerns.  

## 2018-08-28 NOTE — Assessment & Plan Note (Addendum)
Smoking again. Could not afford his chantix- will send it through again. Call with any concerns. Continue to monitor.

## 2018-08-28 NOTE — Progress Notes (Signed)
Subjective:   Nathan Cooper is a 73 y.o. male who presents for Medicare Annual/Subsequent preventive examination.  Review of Systems:   Cardiac Risk Factors include: advanced age (>37men, >33 women);hypertension;male gender;dyslipidemia;obesity (BMI >30kg/m2);smoking/ tobacco exposure     Objective:    Vitals: BP 116/64 (BP Location: Left Arm, Patient Position: Sitting, Cuff Size: Normal)   Pulse 86   Temp 98.1 F (36.7 C) (Temporal)   Resp 16   Ht 5\' 11"  (1.803 m)   Wt 217 lb 4.8 oz (98.6 kg)   BMI 30.31 kg/m   Body mass index is 30.31 kg/m.  Advanced Directives 08/28/2018 09/21/2017 08/24/2017 08/17/2016 07/17/2015  Does Patient Have a Medical Advance Directive? Yes Yes Yes Yes Yes  Type of Advance Directive Living will;Healthcare Power of [place];Living will [place];Living will [place];Living will Living will;Healthcare Power of Attorney  Does patient want to make changes to medical advance directive? - - - Yes (MAU/Ambulatory/Procedural Areas - Information given) No - Patient declined  Copy of [place] in Chart? No - copy requested No - copy requested No - copy requested No - copy requested No - copy requested    Tobacco Social History   Tobacco Use  Smoking Status Current Every Day Smoker  . Packs/day: 1.00  . Years: 45.00  . Pack years: 45.00  . Types: Cigarettes  Smokeless Tobacco Never Used  Tobacco Comment   has had 5 since jan 2019     Ready to quit: Yes Counseling given: Yes Comment: has had 5 since jan 2019   Clinical Intake:  Pre-visit preparation completed: Yes  Pain : No/denies pain     Nutritional Status: BMI > 30  Obese Nutritional Risks: None Diabetes: No  How often do you need to have someone help you when you read instructions, pamphlets, or other written materials from your doctor or pharmacy?: 1 - Never What is the last grade level you  completed in school?: 2 years college   Interpreter Needed?: No  Information entered by :: Tiffany Hill,LPN   Past Medical History:  Diagnosis Date  . COPD (chronic obstructive pulmonary disease) ([place])   . Coronary artery disease   . Hepatitis C   . Hernia, inguinal 2014  . Hypertension 2009  . Personal history of tobacco use, presenting hazards to health 08/26/2015  . Sleep apnea    Past Surgical History:  Procedure Laterality Date  . COLONOSCOPY  2010  . COLONOSCOPY WITH PROPOFOL N/A 09/21/2017   Procedure: COLONOSCOPY WITH PROPOFOL;  Surgeon: Virgel Manifold, MD;  Location: [facility] ENDOSCOPY;  Service: Endoscopy;  Laterality: N/A;  . HERNIA REPAIR  2014   Family History  Problem Relation Age of Onset  . Hypertension Mother   . Cirrhosis Father   . Gout Son   . Heart disease Maternal Uncle 64  . Heart attack Maternal Uncle   . Cancer Paternal Uncle   . Diabetes Paternal Grandmother    Social History   Socioeconomic History  . Marital status: Divorced    Spouse name: Not on file  . Number of children: Not on file  . Years of education: Not on file  . Highest education level: Associate degree: academic program  Occupational History  . Not on file  Social Needs  . Financial resource strain: Not hard at all  . Food insecurity:    Worry: Never true    Inability: Never true  . Transportation  needs:    Medical: No    Non-medical: No  Tobacco Use  . Smoking status: Current Every Day Smoker    Packs/day: 1.00    Years: 45.00    Pack years: 45.00    Types: Cigarettes  . Smokeless tobacco: Never Used  . Tobacco comment: has had 5 since jan 2019  Substance and Sexual Activity  . Alcohol use: No    Alcohol/week: 0.0 standard drinks  . Drug use: No  . Sexual activity: Not on file  Lifestyle  . Physical activity:    Days per week: 0 days    Minutes per session: 0 min  . Stress: Not at all  Relationships  . Social connections:    Talks on phone: More than three  times a week    Gets together: More than three times a week    Attends religious service: More than 4 times per year    Active member of club or organization: Yes    Attends meetings of clubs or organizations: More than 4 times per year    Relationship status: Divorced  Other Topics Concern  . 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 amLODipine (NORVASC) 10 MG tablet Take 1 tablet (10 mg total) by mouth daily.  Marland Kitchen aspirin 81 MG EC tablet Take 1 tablet (81 mg total) by mouth daily.  . benazepril (LOTENSIN) 40 MG tablet Take 1 tablet (40 mg total) by mouth daily.  . sildenafil (REVATIO) 20 MG tablet Take 1-5 tabs 30 minutes prior to intercourse  . triamcinolone ointment (KENALOG) 0.5 % Apply 1 application topically 2 (two) times daily.  . CHANTIX CONTINUING MONTH PAK 1 MG tablet TAKE 1 TABLET (1 MG TOTAL) BY MOUTH 2 (TWO) TIMES DAILY.  . valACYclovir (VALTREX) 500 MG tablet Take 500 mg by mouth 2 (two) times daily as needed.    No facility-administered encounter medications on file as of 08/28/2018.     Activities of Daily Living In your present state of health, do you have any difficulty performing the following activities: 08/28/2018  Hearing? N  Vision? N  Difficulty concentrating or making decisions? N  Walking or climbing stairs? N  Dressing or bathing? N  Doing errands, shopping? N  Preparing Food and eating ? N  Using the Toilet? N  In the past six months, have you accidently leaked urine? N  Do you have problems with loss of bowel control? N  Managing your Medications? N  Managing your Finances? N  Housekeeping or managing your Housekeeping? N  Some recent data might be hidden    Patient Care Team: Valerie Roys, DO as PCP - General (Family Medicine)   Assessment:   This is a routine wellness  examination for Sonya.  Exercise Activities and Dietary recommendations Current Exercise Habits: Home exercise routine, Type of exercise: strength training/weights, Time (Minutes): 40, Frequency (Times/Week): 2, Weekly Exercise (Minutes/Week): 80, Intensity: Mild, Exercise limited by: None identified  Goals    . Quit Smoking     Smoking cessation discussed       Fall Risk Fall Risk  08/28/2018 08/24/2017 08/17/2016 07/20/2015 12/25/2014  Falls in the past year? 0 No No No No  Number falls in past yr: 0 - - - -  FALL RISK PREVENTION PERTAINING TO THE HOME:  Any stairs in or around the home WITH handrails? No stairs  Home free of loose throw rugs in walkways, pet beds, electrical cords, etc? Yes  Adequate lighting in your home to reduce risk of falls? Yes   ASSISTIVE DEVICES UTILIZED TO PREVENT FALLS:  Life alert? No  Use of a cane, walker or w/c? No  Grab bars in the bathroom? No  Shower chair or bench in shower? No  Elevated toilet seat or a handicapped toilet? No   DME ORDERS:  DME order needed?  No   TIMED UP AND GO:  Was the test performed? Yes .  Length of time to ambulate 10 feet: 12 sec.   GAIT:  Appearance of gait: Gait stead-fast without the use of an assistive device.  Education: Fall risk prevention has been discussed.  Intervention(s) required? No    Depression Screen PHQ 2/9 Scores 08/28/2018 08/24/2017 08/17/2016 07/20/2015  PHQ - 2 Score 0 0 0 0  PHQ- 9 Score - - - 2    Cognitive Function     6CIT Screen 08/28/2018 08/24/2017 08/17/2016  What Year? 0 points 0 points 0 points  What month? 0 points 0 points 0 points  What time? 0 points 0 points 0 points  Count back from 20 0 points 0 points 0 points  Months in reverse 0 points 0 points 0 points  Repeat phrase 0 points 0 points 2 points  Total Score 0 0 2    Immunization History  Administered Date(s) Administered  . Hepatitis A, Adult 08/14/2015, 02/11/2016  . Hepatitis B, adult 07/17/2015, 08/14/2015,  02/11/2016  . Influenza, High Dose Seasonal PF 05/07/2016, 08/28/2018  . Influenza,inj,Quad PF,6+ Mos 07/17/2015  . Influenza-Unspecified 05/16/2014, 05/24/2017  . Pneumococcal Conjugate-13 05/16/2014  . Pneumococcal Polysaccharide-23 07/17/2015  . Td 01/23/2003, 07/17/2015    Qualifies for Shingles Vaccine? Yes  Zostavax completed n/a. Due for Shingrix. Education has been provided regarding the importance of this vaccine. Pt has been advised to call insurance company to determine out of pocket expense. Advised may also receive vaccine at local pharmacy or Health Dept. Verbalized acceptance and understanding.  Tdap:up to date   Flu Vaccine: up to date  Pneumococcal Vaccine: up to date   Screening Tests Health Maintenance  Topic Date Due  . INFLUENZA VACCINE  02/16/2018  . COLONOSCOPY  09/21/2020  . TETANUS/TDAP  07/16/2025  . Hepatitis C Screening  Completed  . PNA vac Low Risk Adult  Completed   Cancer Screenings:  Colorectal Screening: Completed 09/21/2017. Repeat every 3 years  Lung Cancer Screening: (Low Dose CT Chest recommended if Age 32-80 years, 30 pack-year currently smoking OR have quit w/in 15years.) does qualify.  Lung cancer screening yearly.     Additional Screening:  Hepatitis C Screening: does qualify; Completed 02/24/2018  Vision Screening: Recommended annual ophthalmology exams for early detection of glaucoma and other disorders of the eye. Is the patient up to date with their annual eye exam?  No  Who is the provider or what is the name of the office in which the pt attends annual eye exams? n/a If pt is not established with a provider, would they like to be referred to a provider to establish care? No .   Dental Screening: Recommended annual dental exams for proper oral hygiene  Community Resource Referral:  CRR required this visit?  No     Plan:    I have personally reviewed and addressed the  Medicare Annual Wellness questionnaire and have noted the  following in the patient's chart:  A. Medical and social history B. Use of alcohol, tobacco or illicit drugs  C. Current medications and supplements D. Functional ability and status E.  Nutritional status F.  Physical activity G. Advance directives H. List of other physicians I.  Hospitalizations, surgeries, and ER visits in previous 12 months J.  [place] such as hearing and vision if needed, cognitive and depression L. Referrals and appointments   In addition, I have reviewed and discussed with patient certain preventive protocols, quality metrics, and best practice recommendations. A written personalized care plan for preventive services as well as general preventive health recommendations were provided to patient.   Signed,  Tyler Aas, LPN Nurse Health Advisor   Nurse Notes:none

## 2018-08-28 NOTE — Assessment & Plan Note (Signed)
Has been treated. Doing well. Checking labs again today.

## 2018-08-28 NOTE — Assessment & Plan Note (Addendum)
Under good control with A1c of 5.6. Continue diet and exercise. Continue to monitor. Call with any concerns. Has not had IFG range sugar in 2 years. Resolving to history.

## 2018-08-28 NOTE — Patient Instructions (Signed)
Health Maintenance After Age 71 After age 71, you are at a higher risk for certain long-term diseases and infections as well as injuries from falls. Falls are a major cause of broken bones and head injuries in people who are older than age 71. Getting regular preventive care can help to keep you healthy and well. Preventive care includes getting regular testing and making lifestyle changes as recommended by your health care provider. Talk with your health care provider about:  Which screenings and tests you should have. A screening is a test that checks for a disease when you have no symptoms.  A diet and exercise plan that is right for you. What should I know about screenings and tests to prevent falls? Screening and testing are the best ways to find a health problem early. Early diagnosis and treatment give you the best chance of managing medical conditions that are common after age 71. Certain conditions and lifestyle choices may make you more likely to have a fall. Your health care provider may recommend:  Regular vision checks. Poor vision and conditions such as cataracts can make you more likely to have a fall. If you wear glasses, make sure to get your prescription updated if your vision changes.  Medicine review. Work with your health care provider to regularly review all of the medicines you are taking, including over-the-counter medicines. Ask your health care provider about any side effects that may make you more likely to have a fall. Tell your health care provider if any medicines that you take make you feel dizzy or sleepy.  Osteoporosis screening. Osteoporosis is a condition that causes the bones to get weaker. This can make the bones weak and cause them to break more easily.  Blood pressure screening. Blood pressure changes and medicines to control blood pressure can make you feel dizzy.  Strength and balance checks. Your health care provider may recommend certain tests to check your  strength and balance while standing, walking, or changing positions.  Foot health exam. Foot pain and numbness, as well as not wearing proper footwear, can make you more likely to have a fall.  Depression screening. You may be more likely to have a fall if you have a fear of falling, feel emotionally low, or feel unable to do activities that you used to do.  Alcohol use screening. Using too much alcohol can affect your balance and may make you more likely to have a fall. What actions can I take to lower my risk of falls? General instructions  Talk with your health care provider about your risks for falling. Tell your health care provider if: ? You fall. Be sure to tell your health care provider about all falls, even ones that seem minor. ? You feel dizzy, sleepy, or off-balance.  Take over-the-counter and prescription medicines only as told by your health care provider. These include any supplements.  Eat a healthy diet and maintain a healthy weight. A healthy diet includes low-fat dairy products, low-fat (lean) meats, and fiber from whole grains, beans, and lots of fruits and vegetables. Home safety  Remove any tripping hazards, such as rugs, cords, and clutter.  Install safety equipment such as grab bars in bathrooms and safety rails on stairs.  Keep rooms and walkways well-lit. Activity   Follow a regular exercise program to stay fit. This will help you maintain your balance. Ask your health care provider what types of exercise are appropriate for you.  If you need a cane or   walker, use it as recommended by your health care provider.  Wear supportive shoes that have nonskid soles. Lifestyle  Do not drink alcohol if your health care provider tells you not to drink.  If you drink alcohol, limit how much you have: ? 0-1 drink a day for women. ? 0-2 drinks a day for men.  Be aware of how much alcohol is in your drink. In the [place] one drink equals one typical bottle of beer (12  oz), one-half glass of wine (5 oz), or one shot of hard liquor (1 oz).  Do not use any products that contain nicotine or tobacco, such as cigarettes and e-cigarettes. If you need help quitting, ask your health care provider. Summary  Having a healthy lifestyle and getting preventive care can help to protect your health and wellness after age 71.  Screening and testing are the best way to find a health problem early and help you avoid having a fall. Early diagnosis and treatment give you the best chance for managing medical conditions that are more common for people who are older than age 71.  Falls are a major cause of broken bones and head injuries in people who are older than age 71. Take precautions to prevent a fall at home.  Work with your health care provider to learn what changes you can make to improve your health and wellness and to prevent falls. This information is not intended to replace advice given to you by your health care provider. Make sure you discuss any questions you have with your health care provider. Document Released: 05/18/2017 Document Revised: 05/18/2017 Document Reviewed: 05/18/2017 [employer]vier Interactive Patient Education  2019 [employer]  

## 2018-08-28 NOTE — Patient Instructions (Addendum)
Nathan Cooper , Thank you for taking time to come for your Medicare Wellness Visit. I appreciate your ongoing commitment to your health goals. Please review the following plan we discussed and let me know if I can assist you in the future.   Screening recommendations/referrals: Colonoscopy: completed 10/01/2017 Recommended yearly ophthalmology/optometry visit for glaucoma screening and checkup Recommended yearly dental visit for hygiene and checkup  Vaccinations: Influenza vaccine: done today  Pneumococcal vaccine: completed series Tdap vaccine: up to date Shingles vaccine: shingrix eligible, check with your insurance company for coverage   Advanced directives: Please bring a copy of your health care power of attorney and living will to the office at your convenience.  Conditions/risks identified: If you wish to quit smoking, help is available. For free tobacco cessation program offerings call the [facility] at 734-020-0001 or Live Well Line at [phone]. You may also visit [email or web address] or email [email or web address] for more information on other programs.   Next appointment: follow up in one year for your annual wellness exam.   Preventive Care 65 Years and Older, Male Preventive care refers to lifestyle choices and visits with your health care provider that can promote health and wellness. What does preventive care include?  A yearly physical exam. This is also called an annual well check.  Dental exams once or twice a year.  Routine eye exams. Ask your health care provider how often you should have your eyes checked.  Personal lifestyle choices, including:  Daily care of your teeth and gums.  Regular physical activity.  Eating a healthy diet.  Avoiding tobacco and drug use.  Limiting alcohol use.  Practicing safe sex.  Taking low doses of aspirin every day.  Taking vitamin and mineral supplements as recommended by your health care  provider. What happens during an annual well check? The services and screenings done by your health care provider during your annual well check will depend on your age, overall health, lifestyle risk factors, and family history of disease. Counseling  Your health care provider may ask you questions about your:  Alcohol use.  Tobacco use.  Drug use.  Emotional well-being.  Home and relationship well-being.  Sexual activity.  Eating habits.  History of falls.  Memory and ability to understand (cognition).  Work and work Statistician. Screening  You may have the following tests or measurements:  Height, weight, and BMI.  Blood pressure.  Lipid and cholesterol levels. These may be checked every 5 years, or more frequently if you are over 40 years old.  Skin check.  Lung cancer screening. You may have this screening every year starting at age 45 if you have a 30-pack-year history of smoking and currently smoke or have quit within the past 15 years.  Fecal occult blood test (FOBT) of the stool. You may have this test every year starting at age 84.  Flexible sigmoidoscopy or colonoscopy. You may have a sigmoidoscopy every 5 years or a colonoscopy every 10 years starting at age 91.  Prostate cancer screening. Recommendations will vary depending on your family history and other risks.  Hepatitis C blood test.  Hepatitis B blood test.  Sexually transmitted disease (STD) testing.  Diabetes screening. This is done by checking your blood sugar (glucose) after you have not eaten for a while (fasting). You may have this done every 1-3 years.  Abdominal aortic aneurysm (AAA) screening. You may need this if you are a current or former smoker.  Osteoporosis. You may be  screened starting at age 66 if you are at high risk. Talk with your health care provider about your test results, treatment options, and if necessary, the need for more tests. Vaccines  Your health care provider  may recommend certain vaccines, such as:  Influenza vaccine. This is recommended every year.  Tetanus, diphtheria, and acellular pertussis (Tdap, Td) vaccine. You may need a Td booster every 10 years.  Zoster vaccine. You may need this after age 53.  Pneumococcal 13-valent conjugate (PCV13) vaccine. One dose is recommended after age 18.  Pneumococcal polysaccharide (PPSV23) vaccine. One dose is recommended after age 38. Talk to your health care provider about which screenings and vaccines you need and how often you need them. This information is not intended to replace advice given to you by your health care provider. Make sure you discuss any questions you have with your health care provider. Document Released: 08/01/2015 Document Revised: 03/24/2016 Document Reviewed: 05/06/2015 Elsevier Interactive Patient Education  2017 [place] Prevention in the Home Falls can cause injuries. They can happen to people of all ages. There are many things you can do to make your home safe and to help prevent falls. What can I do on the outside of my home?  Regularly fix the edges of walkways and driveways and fix any cracks.  Remove anything that might make you trip as you walk through a door, such as a raised step or threshold.  Trim any bushes or trees on the path to your home.  Use bright outdoor lighting.  Clear any walking paths of anything that might make someone trip, such as rocks or tools.  Regularly check to see if handrails are loose or broken. Make sure that both sides of any steps have handrails.  Any raised decks and porches should have guardrails on the edges.  Have any leaves, snow, or ice cleared regularly.  Use sand or salt on walking paths during winter.  Clean up any spills in your garage right away. This includes oil or grease spills. What can I do in the bathroom?  Use night lights.  Install grab bars by the toilet and in the tub and shower. Do not use  towel bars as grab bars.  Use non-skid mats or decals in the tub or shower.  If you need to sit down in the shower, use a plastic, non-slip stool.  Keep the floor dry. Clean up any water that spills on the floor as soon as it happens.  Remove soap buildup in the tub or shower regularly.  Attach bath mats securely with double-sided non-slip rug tape.  Do not have throw rugs and other things on the floor that can make you trip. What can I do in the bedroom?  Use night lights.  Make sure that you have a light by your bed that is easy to reach.  Do not use any sheets or blankets that are too big for your bed. They should not hang down onto the floor.  Have a firm chair that has side arms. You can use this for support while you get dressed.  Do not have throw rugs and other things on the floor that can make you trip. What can I do in the kitchen?  Clean up any spills right away.  Avoid walking on wet floors.  Keep items that you use a lot in easy-to-reach places.  If you need to reach something above you, use a strong step stool that has a  grab bar.  Keep electrical cords out of the way.  Do not use floor polish or wax that makes floors slippery. If you must use wax, use non-skid floor wax.  Do not have throw rugs and other things on the floor that can make you trip. What can I do with my stairs?  Do not leave any items on the stairs.  Make sure that there are handrails on both sides of the stairs and use them. Fix handrails that are broken or loose. Make sure that handrails are as long as the stairways.  Check any carpeting to make sure that it is firmly attached to the stairs. Fix any carpet that is loose or worn.  Avoid having throw rugs at the top or bottom of the stairs. If you do have throw rugs, attach them to the floor with carpet tape.  Make sure that you have a light switch at the top of the stairs and the bottom of the stairs. If you do not have them, ask  someone to add them for you. What else can I do to help prevent falls?  Wear shoes that:  Do not have high heels.  Have rubber bottoms.  Are comfortable and fit you well.  Are closed at the toe. Do not wear sandals.  If you use a stepladder:  Make sure that it is fully opened. Do not climb a closed stepladder.  Make sure that both sides of the stepladder are locked into place.  Ask someone to hold it for you, if possible.  Clearly mark and make sure that you can see:  Any grab bars or handrails.  First and last steps.  Where the edge of each step is.  Use tools that help you move around (mobility aids) if they are needed. These include:  Canes.  Walkers.  Scooters.  Crutches.  Turn on the lights when you go into a dark area. Replace any light bulbs as soon as they burn out.  Set up your furniture so you have a clear path. Avoid moving your furniture around.  If any of your floors are uneven, fix them.  If there are any pets around you, be aware of where they are.  Review your medicines with your doctor. Some medicines can make you feel dizzy. This can increase your chance of falling. Ask your doctor what other things that you can do to help prevent falls. This information is not intended to replace advice given to you by your health care provider. Make sure you discuss any questions you have with your health care provider. Document Released: 05/01/2009 Document Revised: 12/11/2015 Document Reviewed: 08/09/2014 Elsevier Interactive Patient Education  2017 [place].   Influenza (Flu) Vaccine (Inactivated or Recombinant): What You Need to Know 1. Why get vaccinated? Influenza vaccine can prevent influenza (flu). Flu is a contagious disease that spreads around the [place] every year, usually between October and May. Anyone can get the flu, but it is more dangerous for some people. Infants and young children, people 70 years of age and older, pregnant  women, and people with certain health conditions or a weakened immune system are at greatest risk of flu complications. Pneumonia, bronchitis, sinus infections and ear infections are examples of flu-related complications. If you have a medical condition, such as heart disease, cancer or diabetes, flu can make it worse. Flu can cause fever and chills, sore throat, muscle aches, fatigue, cough, headache, and runny or stuffy nose. Some people may have vomiting  and diarrhea, though this is more common in children than adults. Each year thousands of people in the [place] die from flu, and many more are hospitalized. Flu vaccine prevents millions of illnesses and flu-related visits to the doctor each year. 2. Influenza vaccine CDC recommends everyone 65 months of age and older get vaccinated every flu season. Children 6 months through 48 years of age may need 2 doses during a single flu season. Everyone else needs only 1 dose each flu season. It takes about 2 weeks for protection to develop after vaccination. There are many flu viruses, and they are always changing. Each year a new flu vaccine is made to protect against three or four viruses that are likely to cause disease in the upcoming flu season. Even when the vaccine doesn't exactly match these viruses, it may still provide some protection. Influenza vaccine does not cause flu. Influenza vaccine may be given at the same time as other vaccines. 3. Talk with your health care provider Tell your vaccine provider if the person getting the vaccine:  Has had an allergic reaction after a previous dose of influenza vaccine, or has any severe, life-threatening allergies.  Has ever had Guillain-Barr Syndrome (also called GBS). In some cases, your health care provider may decide to postpone influenza vaccination to a future visit. People with minor illnesses, such as a cold, may be vaccinated. People who are moderately or severely ill should usually wait  until they recover before getting influenza vaccine. Your health care provider can give you more information. 4. Risks of a vaccine reaction  Soreness, redness, and swelling where shot is given, fever, muscle aches, and headache can happen after influenza vaccine.  There may be a very small increased risk of Guillain-Barr Syndrome (GBS) after inactivated influenza vaccine (the flu shot). Young children who get the flu shot along with pneumococcal vaccine (PCV13), and/or DTaP vaccine at the same time might be slightly more likely to have a seizure caused by fever. Tell your health care provider if a child who is getting flu vaccine has ever had a seizure. People sometimes faint after medical procedures, including vaccination. Tell your provider if you feel dizzy or have vision changes or ringing in the ears. As with any medicine, there is a very remote chance of a vaccine causing a severe allergic reaction, other serious injury, or death. 5. What if there is a serious problem? An allergic reaction could occur after the vaccinated person leaves the clinic. If you see signs of a severe allergic reaction (hives, swelling of the face and throat, difficulty breathing, a fast heartbeat, dizziness, or weakness), call 9-1-1 and get the person to the nearest hospital. For other signs that concern you, call your health care provider. Adverse reactions should be reported to the Vaccine Adverse Event Reporting System (VAERS). Your health care provider will usually file this report, or you can do it yourself. Visit the VAERS website at www.vaers.[employer]amed[employer].es or call 901-639-4462.VAERS is only for reporting reactions, and VAERS staff do not give medical advice. 6. The National Vaccine Injury Compensation Program The [employer] Vaccine Injury Compensation Program (VICP) is a federal program that was created to compensate people who may have been injured by certain vaccines. Visit the VICP website at  [employer]Cl[employer].com.ee or call (253)216-1034 to learn about the program and about filing a claim. There is a time limit to file a claim for compensation. 7. How can I learn more?  Ask your healthcare provider.  Call your local or  state health department.  Contact the Centers for Disease Control and Prevention (CDC): ? Call (904)683-1418 (1-800-CDC-INFO) or ? Visit CDC's https://[place].com/ Vaccine Information Statement (Interim) Inactivated Influenza Vaccine (03/02/2018) This information is not intended to replace advice given to you by your health care provider. Make sure you discuss any questions you have with your health care provider. Document Released: 04/29/2006 Document Revised: 03/06/2018 Document Reviewed: 03/06/2018 Elsevier Interactive Patient Education  2019 [employer].

## 2018-08-29 LAB — COMPREHENSIVE METABOLIC PANEL
A/G RATIO: 1.6 (ref 1.2–2.2)
ALK PHOS: 69 IU/L (ref 39–117)
ALT: 12 IU/L (ref 0–44)
AST: 16 IU/L (ref 0–40)
Albumin: 4.6 g/dL (ref 3.8–4.8)
BILIRUBIN TOTAL: 0.4 mg/dL (ref 0.0–1.2)
BUN/Creatinine Ratio: 11 (ref 10–24)
BUN: 13 mg/dL (ref 8–27)
CHLORIDE: 101 mmol/L (ref 96–106)
CO2: 24 mmol/L (ref 20–29)
Calcium: 9.1 mg/dL (ref 8.6–10.2)
Creatinine, Ser: 1.22 mg/dL (ref 0.76–1.27)
GFR calc non Af Amer: 60 mL/min/{1.73_m2} (ref >59)
GFR, EST AFRICAN AMERICAN: 69 mL/min/{1.73_m2} (ref >59)
Globulin, Total: 2.8 g/dL (ref 1.5–4.5)
Glucose: 94 mg/dL (ref 65–99)
POTASSIUM: 4.1 mmol/L (ref 3.5–5.2)
Sodium: 137 mmol/L (ref 134–144)
TOTAL PROTEIN: 7.4 g/dL (ref 6.0–8.5)

## 2018-08-29 LAB — LIPID PANEL W/O CHOL/HDL RATIO
CHOLESTEROL TOTAL: 160 mg/dL (ref 100–199)
HDL: 43 mg/dL (ref >39)
LDL Calculated: 97 mg/dL (ref 0–99)
Triglycerides: 100 mg/dL (ref 0–149)
VLDL Cholesterol Cal: 20 mg/dL (ref 5–40)

## 2018-08-29 LAB — CBC WITH DIFFERENTIAL/PLATELET
BASOS ABS: 0 10*3/uL (ref 0.0–0.2)
BASOS: 0 %
EOS (ABSOLUTE): 0.1 10*3/uL (ref 0.0–0.4)
Eos: 1 %
Hematocrit: 39.3 % (ref 37.5–51.0)
Hemoglobin: 13.9 g/dL (ref 13.0–17.7)
Immature Grans (Abs): 0 10*3/uL (ref 0.0–0.1)
Immature Granulocytes: 0 %
LYMPHS ABS: 1.6 10*3/uL (ref 0.7–3.1)
Lymphs: 15 %
MCH: 32.2 pg (ref 26.6–33.0)
MCHC: 35.4 g/dL (ref 31.5–35.7)
MCV: 91 fL (ref 79–97)
MONOS ABS: 0.8 10*3/uL (ref 0.1–0.9)
Monocytes: 7 %
NEUTROS ABS: 8.1 10*3/uL — AB (ref 1.4–7.0)
Neutrophils: 77 %
PLATELETS: 317 10*3/uL (ref 150–450)
RBC: 4.32 x10E6/uL (ref 4.14–5.80)
RDW: 13.3 % (ref 11.6–15.4)
WBC: 10.6 10*3/uL (ref 3.4–10.8)

## 2018-08-29 LAB — PSA: Prostate Specific Ag, Serum: 0.8 ng/mL (ref 0.0–4.0)

## 2018-08-29 LAB — HCV RNA QUANT: HEPATITIS C QUANTITATION: NOT DETECTED [IU]/mL

## 2018-08-29 LAB — TSH: TSH: 0.609 u[IU]/mL (ref 0.450–4.500)

## 2018-08-30 ENCOUNTER — Encounter: Payer: Medicare Other | Admitting: Family Medicine

## 2018-09-07 ENCOUNTER — Telehealth: Payer: Self-pay | Admitting: *Deleted

## 2018-09-07 ENCOUNTER — Encounter: Payer: Self-pay | Admitting: *Deleted

## 2018-09-07 DIAGNOSIS — Z122 Encounter for screening for malignant neoplasm of respiratory organs: Secondary | ICD-10-CM

## 2018-09-07 NOTE — Telephone Encounter (Signed)
Called pt to inform him of his appt for ldct screening on Wednesday 09/13/2018 here @ [facility] @ 10:15am., message left and appt mailed.

## 2018-09-07 NOTE — Telephone Encounter (Signed)
Patient has been notified that the annual lung cancer screening low dose CT scan is due currently or will be in the near future.  Confirmed that the patient is within the age range of 19-80, and asymptomatic, and currently exhibits no signs or symptoms of lung cancer.  Patient denies illness that would prevent curative treatment for lung cancer if found.  Verified smoking history, current smoker 1 ppd with 47.5pkyr history .  The shared decision making visit was completed on 08-27-15.  Patient is agreeable for the CT scan to be scheduled.  Will call patient back with date and time of appointment.

## 2018-09-13 ENCOUNTER — Telehealth: Payer: Self-pay | Admitting: *Deleted

## 2018-09-13 ENCOUNTER — Encounter: Payer: Self-pay | Admitting: *Deleted

## 2018-09-13 ENCOUNTER — Ambulatory Visit: Admission: RE | Admit: 2018-09-13 | Payer: Medicare Other | Source: Ambulatory Visit

## 2018-09-13 NOTE — Telephone Encounter (Signed)
Left voicemail in attempt to reschedule no show appt for lung cancer screening.

## 2018-09-14 ENCOUNTER — Telehealth: Payer: Self-pay | Admitting: *Deleted

## 2018-09-14 NOTE — Telephone Encounter (Signed)
Attempted to contact patient r/t LDCT Screening follow up due at this time.  No answer received, message left for patient to call [phone] to schedule appointment.    

## 2018-09-15 ENCOUNTER — Telehealth: Payer: Self-pay | Admitting: *Deleted

## 2018-09-15 ENCOUNTER — Encounter: Payer: Self-pay | Admitting: *Deleted

## 2018-09-15 NOTE — Telephone Encounter (Signed)
Attempted to contact patient r/t LDCT Screening follow up due at this time.  No answer received, message left for patient to call [phone] to schedule appointment.   LETTER SENT

## 2019-02-21 ENCOUNTER — Other Ambulatory Visit: Payer: Self-pay | Admitting: Family Medicine

## 2019-02-21 NOTE — Telephone Encounter (Signed)
Requested Prescriptions  Pending Prescriptions Disp Refills  . triamcinolone ointment (KENALOG) 0.5 % [Pharmacy Med Name: TRIAMCINOLONE 0.5% OINTMENT] 45 g 0    Sig: APPLY TO AFFECTED AREA TWICE A DAY     Dermatology:  Corticosteroids Passed - 02/21/2019  9:17 AM      Passed - Valid encounter within last 12 months    Recent Outpatient Visits          5 months ago Routine general medical examination at a health care facility   [facility], [place], DO   12 months ago Essential hypertension   [place], [place], DO   1 year ago Low back pain, unspecified back pain laterality, unspecified chronicity, with sciatica presence unspecified   [place], [place], DO   1 year ago Routine general medical examination at a health care facility   [facility], [place], DO   1 year ago OSA on CPAP   [place], [place], DO      Future Appointments            In 2 weeks Wynetta Emery, Barb Merino, DO [facility], [place]   In 6 months  [facility], [place]   In 6 months Wynetta Emery, Barb Merino, DO [facility], [facility]

## 2019-02-26 ENCOUNTER — Ambulatory Visit: Payer: Medicare Other | Admitting: Family Medicine

## 2019-03-09 ENCOUNTER — Other Ambulatory Visit: Payer: Self-pay | Admitting: Family Medicine

## 2019-03-09 DIAGNOSIS — I10 Essential (primary) hypertension: Secondary | ICD-10-CM

## 2019-03-09 NOTE — Telephone Encounter (Signed)
Forwarding medication refill request to PCP for review. 

## 2019-03-12 ENCOUNTER — Ambulatory Visit (INDEPENDENT_AMBULATORY_CARE_PROVIDER_SITE_OTHER): Payer: Medicare Other | Admitting: Family Medicine

## 2019-03-12 ENCOUNTER — Other Ambulatory Visit: Payer: Self-pay

## 2019-03-12 ENCOUNTER — Encounter: Payer: Self-pay | Admitting: Family Medicine

## 2019-03-12 VITALS — BP 134/74 | HR 79 | Temp 98.7°F | Ht 71.0 in | Wt 215.0 lb

## 2019-03-12 DIAGNOSIS — I10 Essential (primary) hypertension: Secondary | ICD-10-CM

## 2019-03-12 DIAGNOSIS — I700 Atherosclerosis of aorta: Secondary | ICD-10-CM

## 2019-03-12 MED ORDER — BENAZEPRIL HCL 40 MG PO TABS: 40.0000 mg | ORAL_TABLET | Freq: Every day | ORAL | 1 refills | Status: DC

## 2019-03-12 MED ORDER — AMLODIPINE BESYLATE 10 MG PO TABS: 10.0000 mg | ORAL_TABLET | Freq: Every day | ORAL | 1 refills | Status: DC

## 2019-03-12 NOTE — Assessment & Plan Note (Signed)
Under good control on current regimen. Continue current regimen. Continue to monitor. Call with any concerns. Refills given. Labs drawn today.   

## 2019-03-12 NOTE — Progress Notes (Signed)
BP 134/74   Pulse 79   Temp 98.7 F (37.1 C) (Oral)   Ht 5\' 11"  (1.803 m)   Wt 215 lb (97.5 kg)   SpO2 96%   BMI 29.99 kg/m    Subjective:    Patient ID: Nathan Cooper, male    DOB: 1945-10-02, 73 y.o.   MRN: [id number]  HPI: Nathan Cooper is a 73 y.o. male  Chief Complaint  Patient presents with  . Hypertension   HYPERTENSION Hypertension status: controlled  Satisfied with current treatment? yes Duration of hypertension: chronic BP monitoring frequency:  not checking BP medication side effects:  no Medication compliance: excellent compliance Previous BP meds: amlodipine, benazepril Aspirin: yes Recurrent headaches: no Visual changes: no Palpitations: no Dyspnea: no Chest pain: no Lower extremity edema: no Dizzy/lightheaded: no  Relevant past medical, surgical, family and social history reviewed and updated as indicated. Interim medical history since our last visit reviewed. Allergies and medications reviewed and updated.  Review of Systems  Constitutional: Negative.   Respiratory: Negative.   Cardiovascular: Negative.   Neurological: Negative.   Psychiatric/Behavioral: Negative.     Per HPI unless specifically indicated above     Objective:    BP 134/74   Pulse 79   Temp 98.7 F (37.1 C) (Oral)   Ht 5\' 11"  (1.803 m)   Wt 215 lb (97.5 kg)   SpO2 96%   BMI 29.99 kg/m   Wt Readings from Last 3 Encounters:  03/12/19 215 lb (97.5 kg)  08/28/18 217 lb 4.8 oz (98.6 kg)  08/28/18 217 lb 4.8 oz (98.6 kg)    Physical Exam Vitals signs and nursing note reviewed.  Constitutional:      General: He is not in acute distress.    Appearance: Normal appearance. He is not ill-appearing, toxic-appearing or diaphoretic.  HENT:     Head: Normocephalic and atraumatic.     Right Ear: External ear normal.     Left Ear: External ear normal.     Nose: Nose normal.     Mouth/Throat:     Mouth: Mucous membranes are moist.     Pharynx: Oropharynx is  clear.  Eyes:     General: No scleral icterus.       Right eye: No discharge.        Left eye: No discharge.     Extraocular Movements: Extraocular movements intact.     Conjunctiva/sclera: Conjunctivae normal.     Pupils: Pupils are equal, round, and reactive to light.  Neck:     Musculoskeletal: Normal range of motion and neck supple.  Cardiovascular:     Rate and Rhythm: Normal rate and regular rhythm.     Pulses: Normal pulses.     Heart sounds: Normal heart sounds. No murmur. No friction rub. No gallop.   Pulmonary:     Effort: Pulmonary effort is normal. No respiratory distress.     Breath sounds: Normal breath sounds. No stridor. No wheezing, rhonchi or rales.  Chest:     Chest wall: No tenderness.  Musculoskeletal: Normal range of motion.  Skin:    General: Skin is warm and dry.     Capillary Refill: Capillary refill takes less than 2 seconds.     Coloration: Skin is not jaundiced or pale.     Findings: No bruising, erythema, lesion or rash.  Neurological:     General: No focal deficit present.     Mental Status: He is alert and oriented to person,  place, and time. Mental status is at baseline.  Psychiatric:        Mood and Affect: Mood normal.        Behavior: Behavior normal.        Thought Content: Thought content normal.        Judgment: Judgment normal.     Results for orders placed or performed in visit on 08/28/18  Bayer DCA Hb A1c Waived  Result Value Ref Range   HB A1C (BAYER DCA - WAIVED) 5.6 <7.0 %  CBC with Differential/Platelet  Result Value Ref Range   WBC 10.6 3.4 - 10.8 x10E3/uL   RBC 4.32 4.14 - 5.80 x10E6/uL   Hemoglobin 13.9 13.0 - 17.7 g/dL   Hematocrit 39.3 37.5 - 51.0 %   MCV 91 79 - 97 fL   MCH 32.2 26.6 - 33.0 pg   MCHC 35.4 31.5 - 35.7 g/dL   RDW 13.3 11.6 - 15.4 %   Platelets 317 150 - 450 x10E3/uL   Neutrophils 77 Not Estab. %   Lymphs 15 Not Estab. %   Monocytes 7 Not Estab. %   Eos 1 Not Estab. %   Basos 0 Not Estab. %    Neutrophils Absolute 8.1 (H) 1.4 - 7.0 x10E3/uL   Lymphocytes Absolute 1.6 0.7 - 3.1 x10E3/uL   Monocytes Absolute 0.8 0.1 - 0.9 x10E3/uL   EOS (ABSOLUTE) 0.1 0.0 - 0.4 x10E3/uL   Basophils Absolute 0.0 0.0 - 0.2 x10E3/uL   Immature Granulocytes 0 Not Estab. %   Immature Grans (Abs) 0.0 0.0 - 0.1 x10E3/uL  Comprehensive metabolic panel  Result Value Ref Range   Glucose 94 65 - 99 mg/dL   BUN 13 8 - 27 mg/dL   Creatinine, Ser 1.22 0.76 - 1.27 mg/dL   GFR calc non Af Amer 60 >59 mL/min/1.73   GFR calc Af Amer 69 >59 mL/min/1.73   BUN/Creatinine Ratio 11 10 - 24   Sodium 137 134 - 144 mmol/L   Potassium 4.1 3.5 - 5.2 mmol/L   Chloride 101 96 - 106 mmol/L   CO2 24 20 - 29 mmol/L   Calcium 9.1 8.6 - 10.2 mg/dL   Total Protein 7.4 6.0 - 8.5 g/dL   Albumin 4.6 3.8 - 4.8 g/dL   Globulin, Total 2.8 1.5 - 4.5 g/dL   Albumin/Globulin Ratio 1.6 1.2 - 2.2   Bilirubin Total 0.4 0.0 - 1.2 mg/dL   Alkaline Phosphatase 69 39 - 117 IU/L   AST 16 0 - 40 IU/L   ALT 12 0 - 44 IU/L  Lipid Panel w/o Chol/HDL Ratio  Result Value Ref Range   Cholesterol, Total 160 100 - 199 mg/dL   Triglycerides 100 0 - 149 mg/dL   HDL 43 >39 mg/dL   VLDL Cholesterol Cal 20 5 - 40 mg/dL   LDL Calculated 97 0 - 99 mg/dL  Microalbumin, Urine Waived  Result Value Ref Range   Microalb, Ur Waived 10 0 - 19 mg/L   Creatinine, Urine Waived 200 10 - 300 mg/dL   Microalb/Creat Ratio <30 <30 mg/g  PSA  Result Value Ref Range   Prostate Specific Ag, Serum 0.8 0.0 - 4.0 ng/mL  TSH  Result Value Ref Range   TSH 0.609 0.450 - 4.500 uIU/mL  UA/M w/rflx Culture, Routine   Specimen: Blood   BLD  Result Value Ref Range   Specific Gravity, UA 1.020 1.005 - 1.030   pH, UA 5.5 5.0 - 7.5   Color, UA  Yellow Yellow   Appearance Ur Clear Clear   Leukocytes, UA Negative Negative   Protein, UA Negative Negative/Trace   Glucose, UA Negative Negative   Ketones, UA Negative Negative   RBC, UA Negative Negative   Bilirubin, UA  Negative Negative   Urobilinogen, Ur 0.2 0.2 - 1.0 mg/dL   Nitrite, UA Negative Negative  HCV RNA quant  Result Value Ref Range   Hepatitis C Quantitation HCV Not Detected IU/mL   Test Information Comment       Assessment & Plan:   Problem List Items Addressed This Visit      Cardiovascular and Mediastinum   HTN (hypertension) - Primary    Under good control on current regimen. Continue current regimen. Continue to monitor. Call with any concerns. Refills given. Labs drawn today.       Relevant Medications   benazepril (LOTENSIN) 40 MG tablet   amLODipine (NORVASC) 10 MG tablet   Other Relevant Orders   Basic metabolic panel   Atherosclerosis of aorta (HCC)    Will keep BP and cholesterol under good control. Continue aspirin. Call with any concerns.       Relevant Medications   benazepril (LOTENSIN) 40 MG tablet   amLODipine (NORVASC) 10 MG tablet       Follow up plan: Return in about 6 months (around 09/12/2019) for Physical/Wellness.

## 2019-03-12 NOTE — Assessment & Plan Note (Signed)
Will keep BP and cholesterol under good control. Continue aspirin. Call with any concerns.

## 2019-03-13 LAB — BASIC METABOLIC PANEL
BUN/Creatinine Ratio: 9 — ABNORMAL LOW (ref 10–24)
BUN: 10 mg/dL (ref 8–27)
CO2: 21 mmol/L (ref 20–29)
Calcium: 9.2 mg/dL (ref 8.6–10.2)
Chloride: 100 mmol/L (ref 96–106)
Creatinine, Ser: 1.14 mg/dL (ref 0.76–1.27)
GFR calc Af Amer: 74 mL/min/{1.73_m2} (ref >59)
GFR calc non Af Amer: 64 mL/min/{1.73_m2} (ref >59)
Glucose: 106 mg/dL — ABNORMAL HIGH (ref 65–99)
Potassium: 4.4 mmol/L (ref 3.5–5.2)
Sodium: 136 mmol/L (ref 134–144)

## 2019-03-20 IMAGING — CT CT CHEST LUNG CANCER SCREENING LOW DOSE W/O CM
2 of 5 series · 15 of 40 positions shown, 18 images · non-contrast
Comparison: 08/27/2016.

CLINICAL DATA: Current smoker, 46 pack-year history, lung cancer
screening.

EXAM:
CT CHEST WITHOUT CONTRAST LOW-DOSE FOR LUNG CANCER SCREENING
TECHNIQUE: Multidetector CT imaging of the chest was performed following the
standard protocol without IV contrast.

[Series 3: lung 1.00 br60 · axial · 0.71mm/px · z∈[-1570,-1207]mm · 12 of 401 slices shown, 15 images]
[im 19/401  mediastinal]
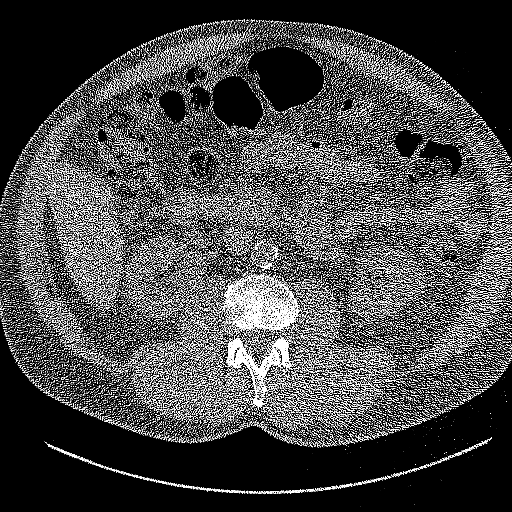
[im 19/401  lung]
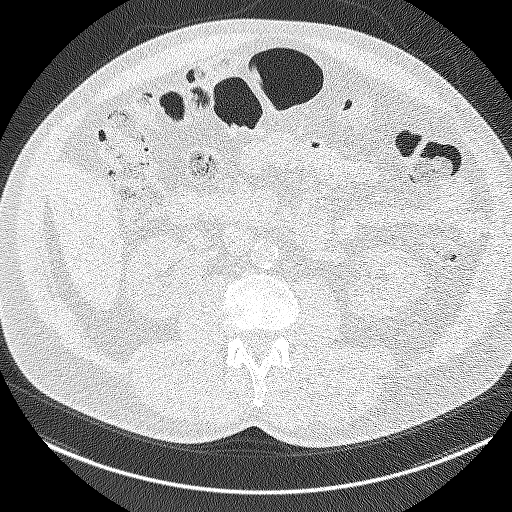
[im 55/401  lung]
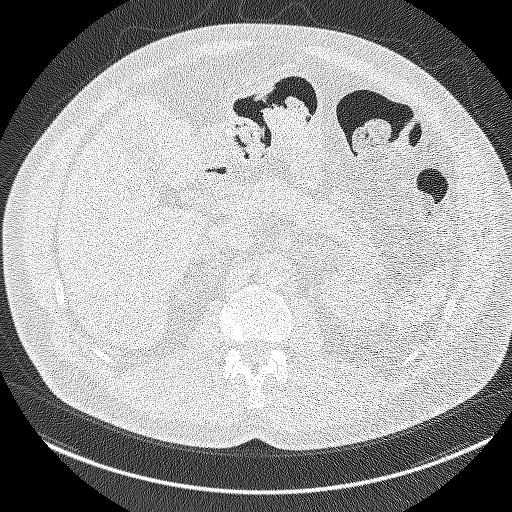
[im 91/401  lung]
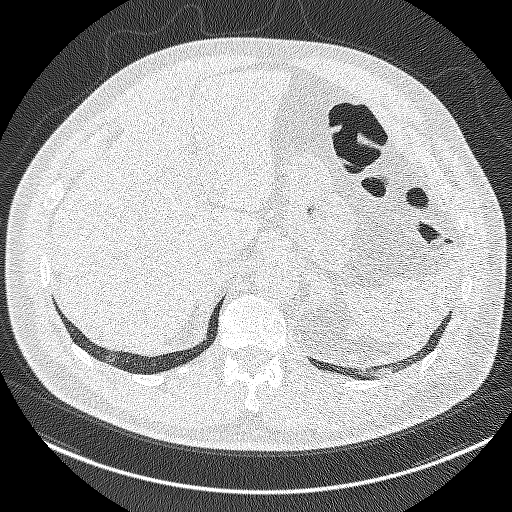
[im 128/401  lung]
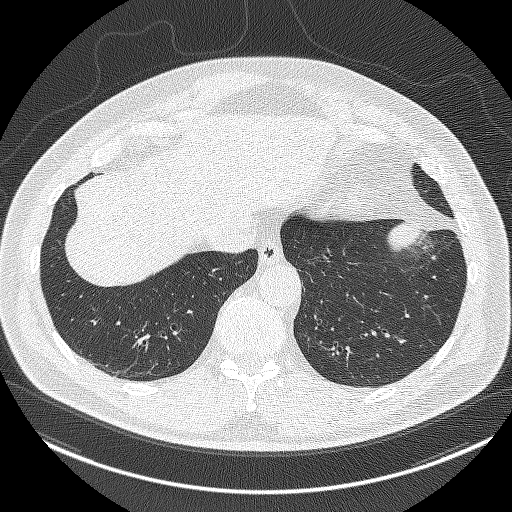
[im 146/401  mediastinal]
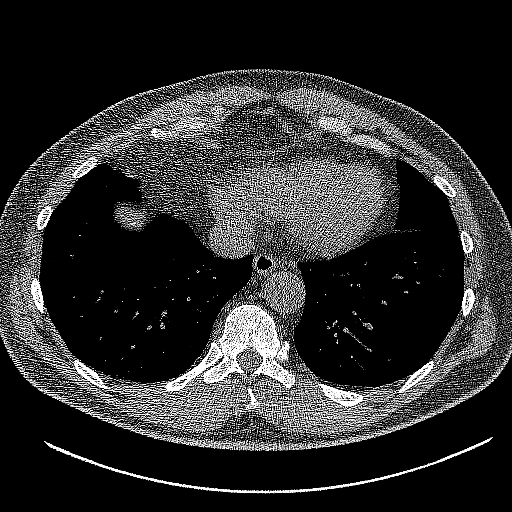
[im 146/401  lung]
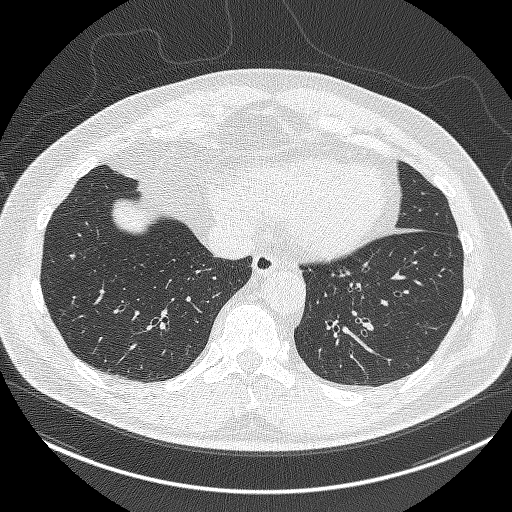
[im 182/401  lung]
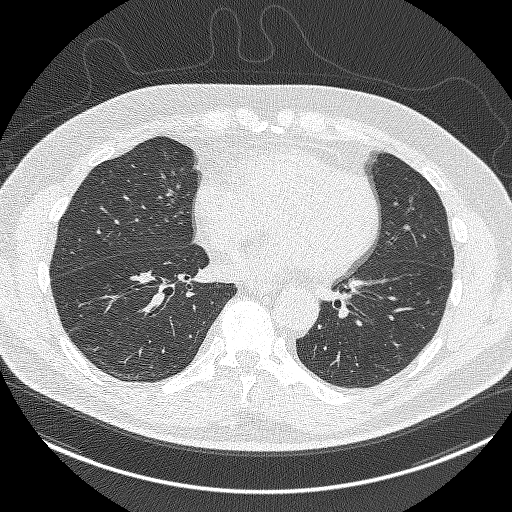
[im 219/401  lung]
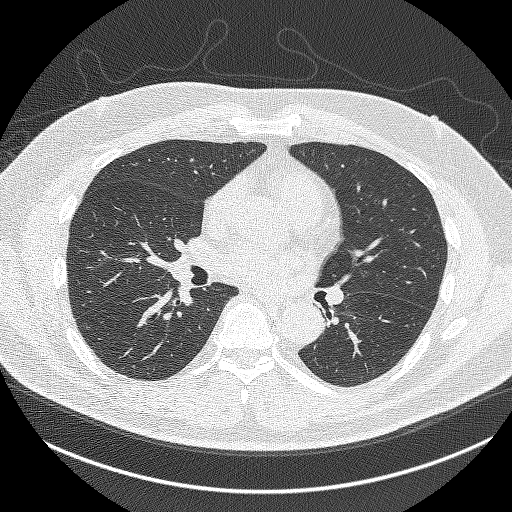
[im 255/401  lung]
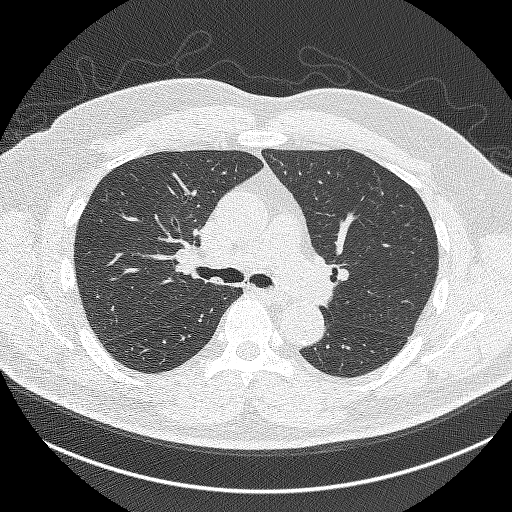
[im 273/401  mediastinal]
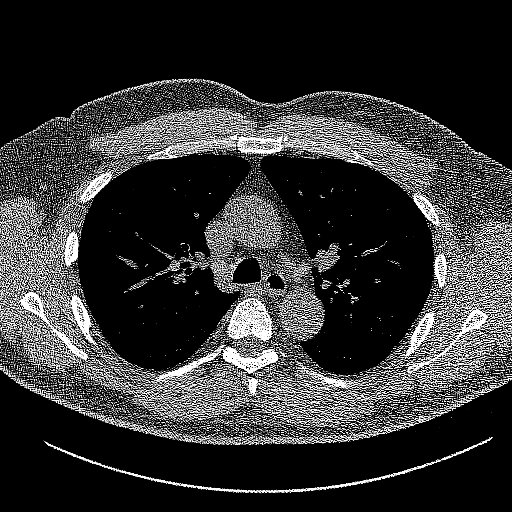
[im 273/401  lung]
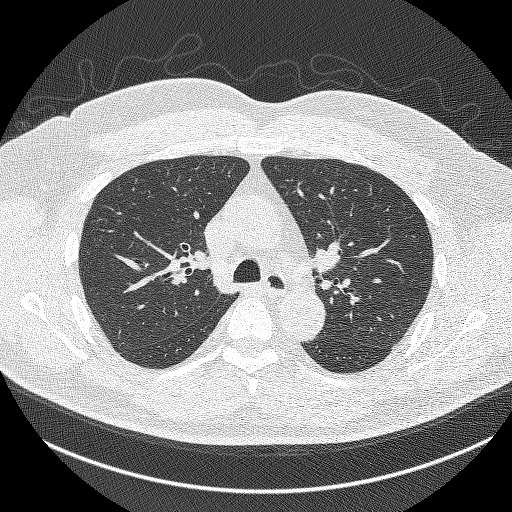
[im 310/401  lung]
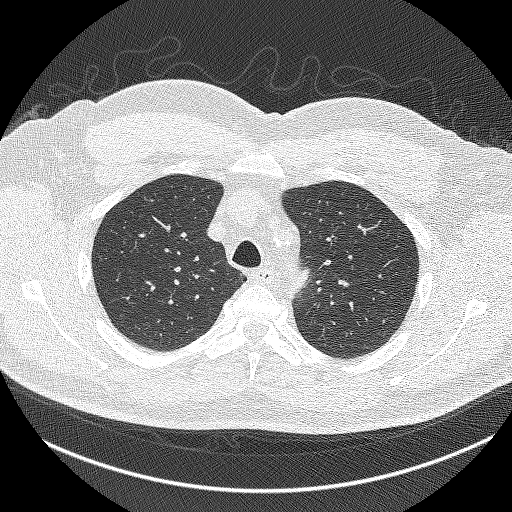
[im 346/401  lung]
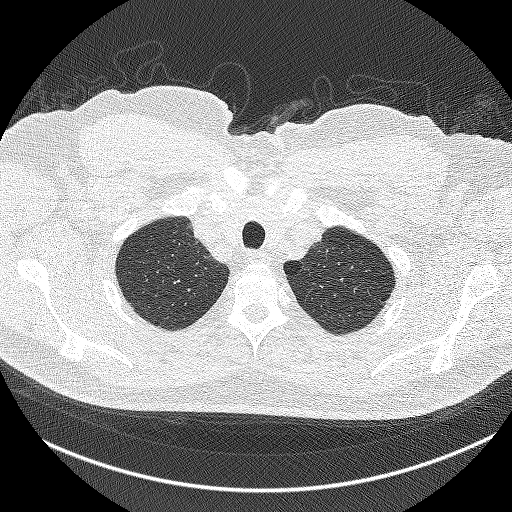
[im 382/401  lung]
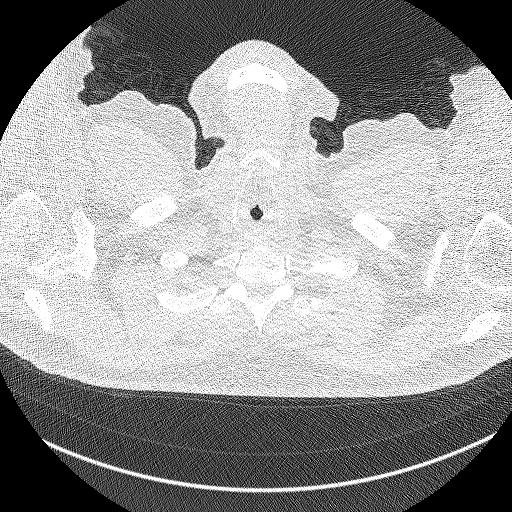

[Series 4: lung 1.00 br44 cor · coronal · 0.71mm/px · 3 of 315 slices shown]
[im 63/315  lung]
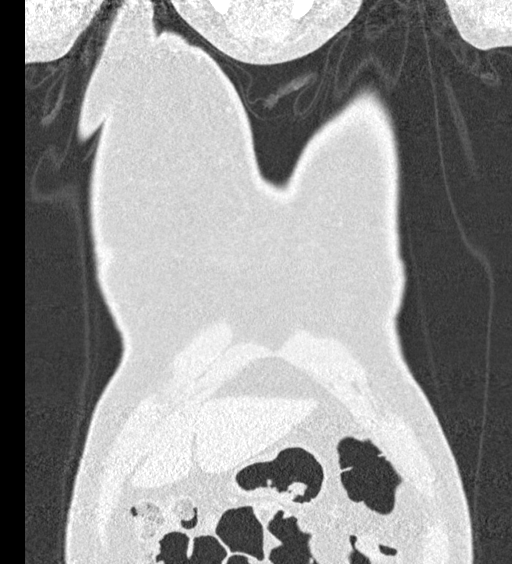
[im 126/315  lung]
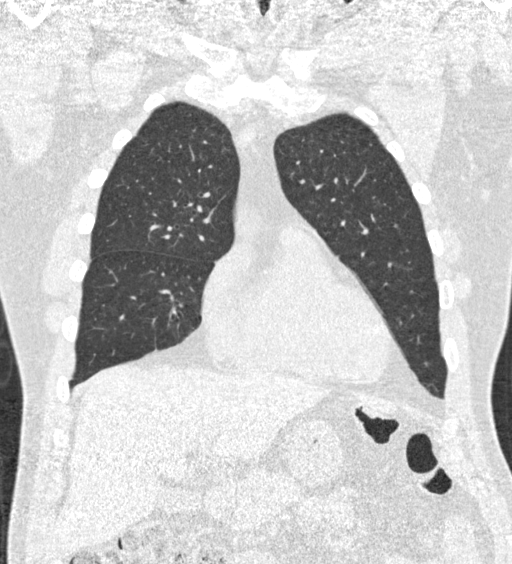
[im 189/315  lung]
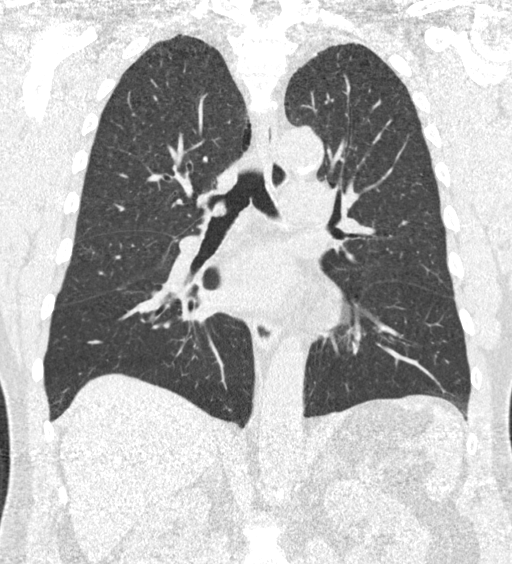

[15 of 40 positions shown; findings below may reference images not displayed]

FINDINGS: Cardiovascular: Atherosclerotic calcification of the arterial
vasculature, including coronary arteries. Heart is at the upper
limits of normal in size. No pericardial effusion.

Mediastinum/Nodes: No pathologically enlarged mediastinal or
axillary lymph nodes. Hilar regions are difficult to evaluate
without IV contrast but appear grossly unremarkable. Esophagus is
grossly unremarkable.

Lungs/Pleura: Mild paraseptal and centrilobular emphysema. Scattered
scarring in the lower lobes. No pulmonary nodules. No pleural fluid.
Debris is seen in the mainstem bronchi bilaterally.

Upper Abdomen: Visualized portions of the liver, gallbladder,
adrenal glands, kidneys, spleen, pancreas, stomach and bowel are
grossly unremarkable. No upper abdominal adenopathy.

Musculoskeletal: Degenerative changes in the spine. No worrisome
lytic or sclerotic lesions.
IMPRESSION: 1. Lung-RADS 1, negative. Continue annual screening with low-dose
chest CT without contrast in 12 months.
2. Aortic atherosclerosis (2EXP9-170.0). Coronary artery
calcification.
3.  Emphysema (2EXP9-SXW.Y).

## 2019-04-11 IMAGING — DX DG ABDOMEN 1V
3 series · 3 of 3 positions shown · non-contrast
Comparison: CT chest of 08/27/2016

CLINICAL DATA: Right flank pain for a month with right groin pain
as well, hematuria

EXAM:
ABDOMEN - 1 VIEW

[abdomen kub (1 of 3)]
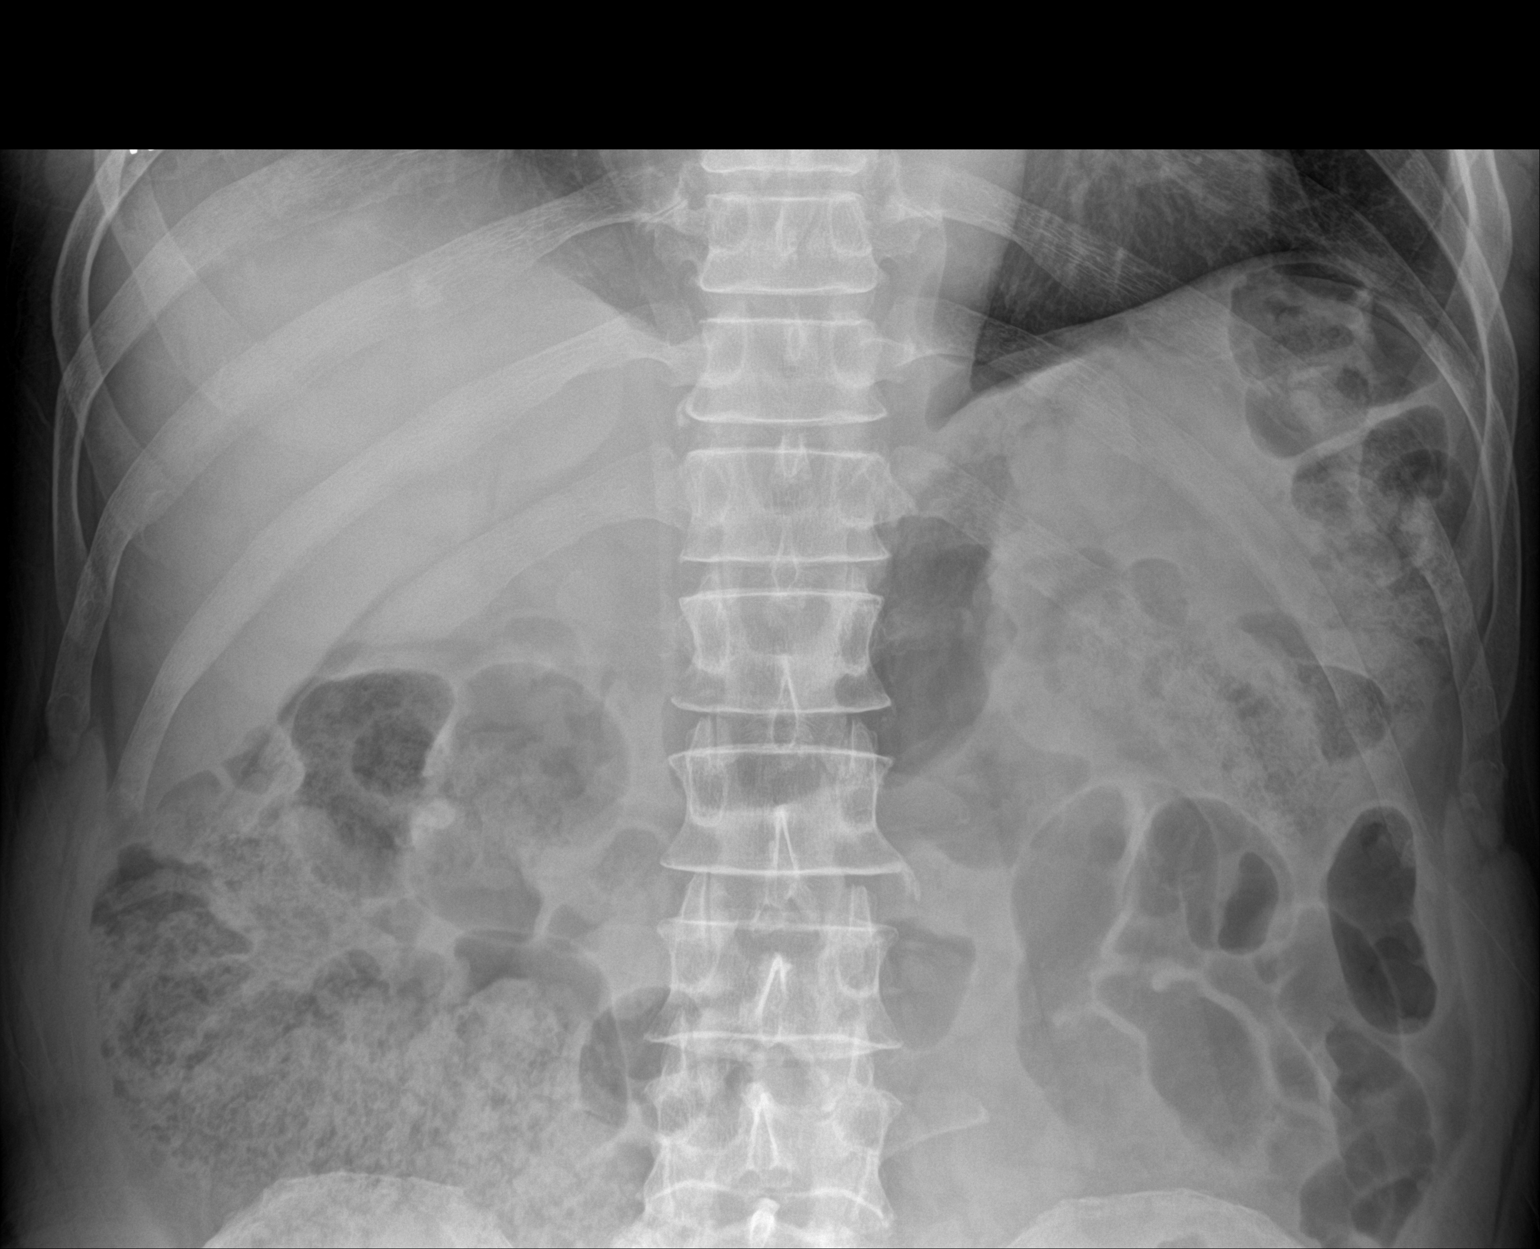

[abdomen kub (2 of 3)]
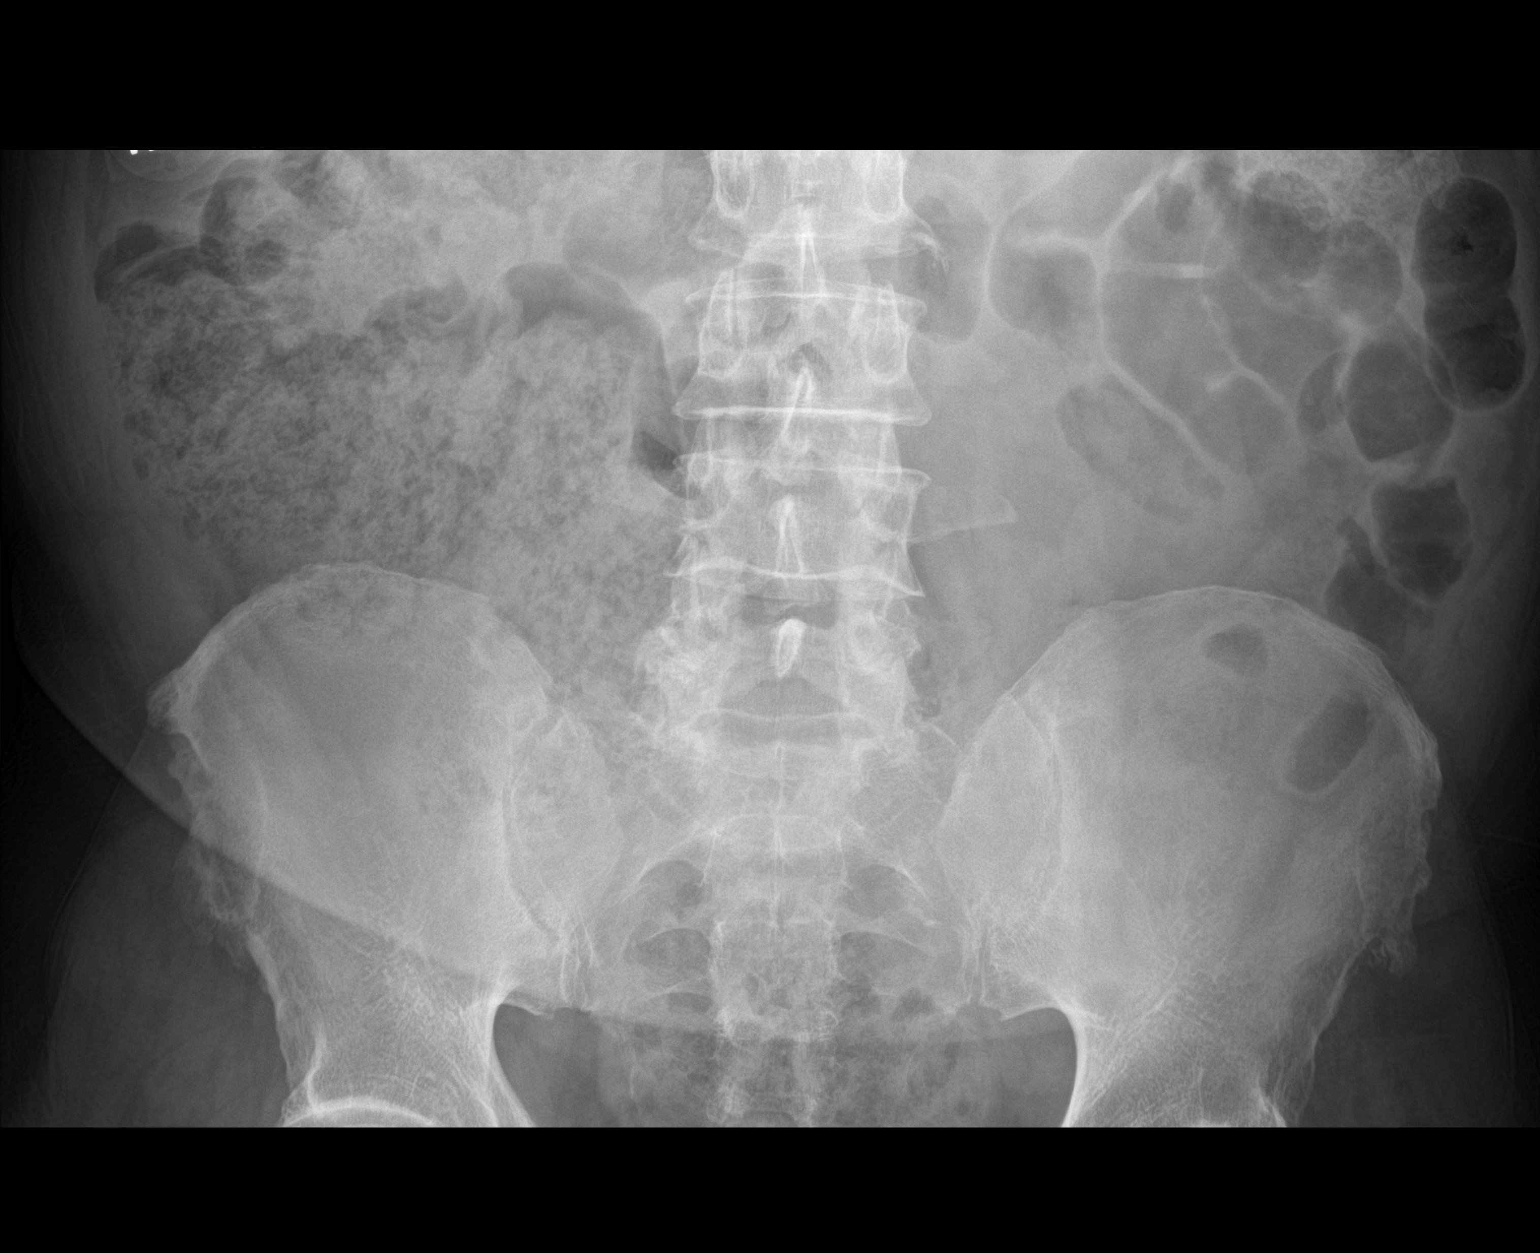

[abdomen kub (3 of 3)]
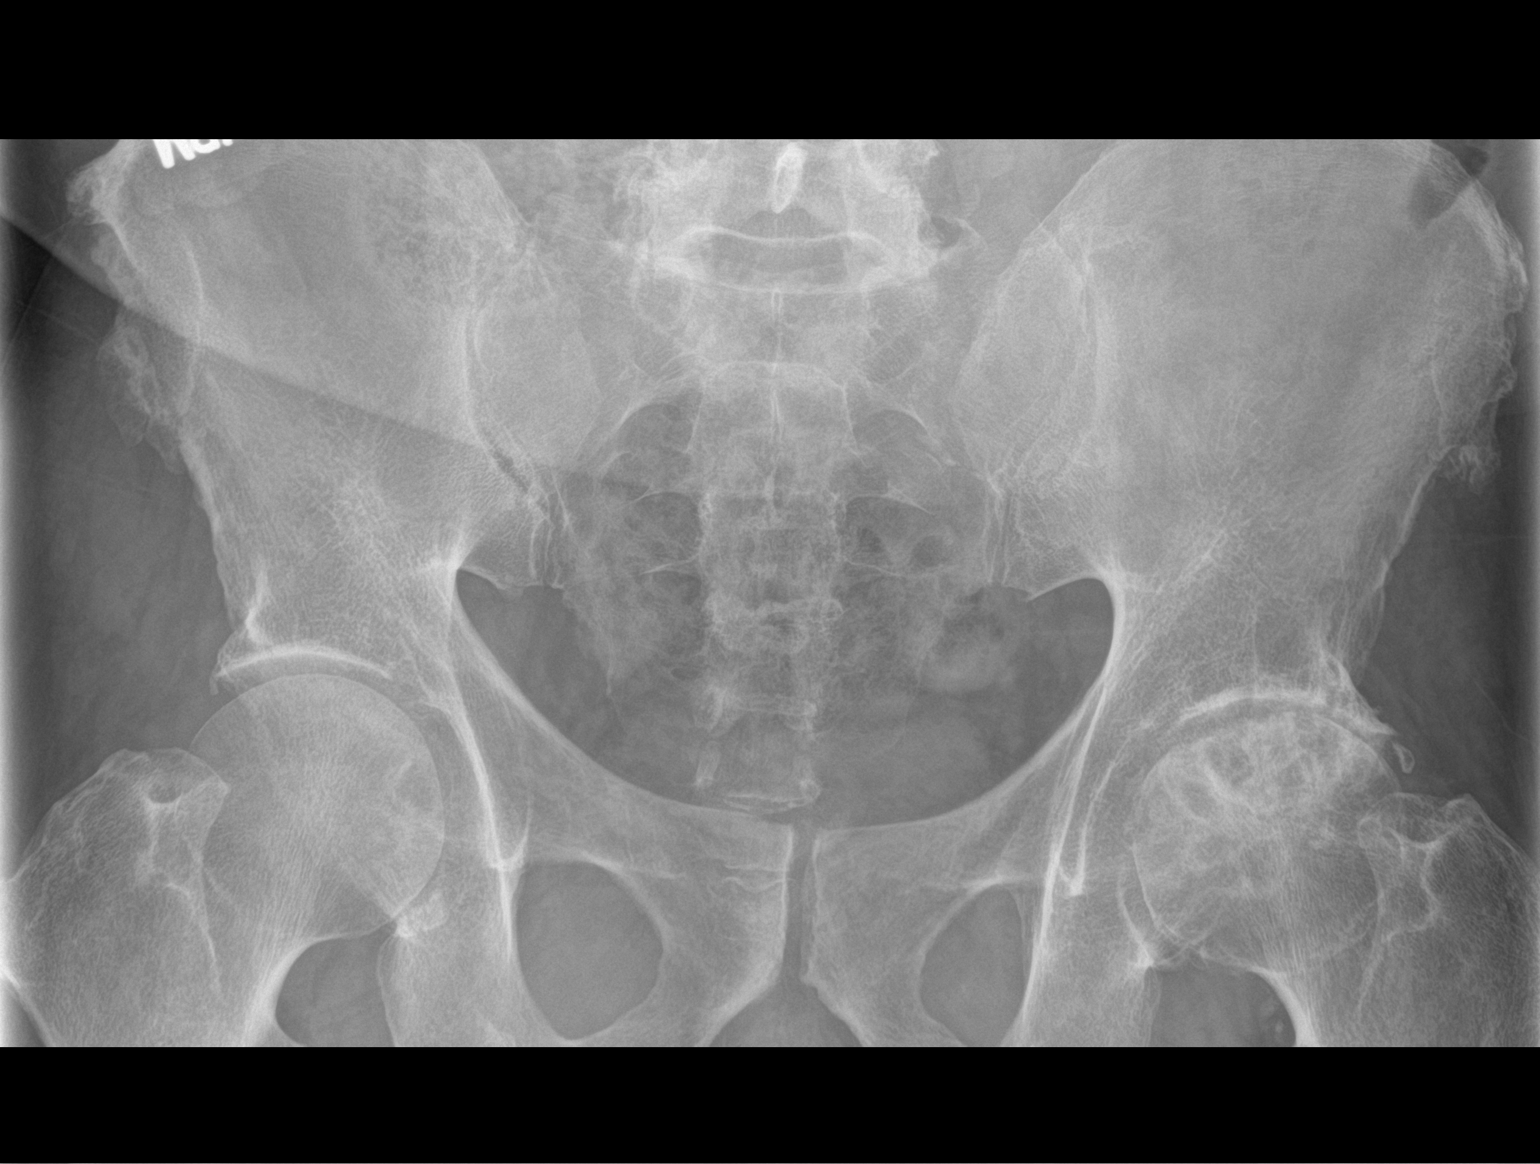

[3 of 3 positions shown; findings below may reference images not displayed]

FINDINGS: Supine views of the abdomen show no definite renal or ureteral
calculi. Unfortunately both renal shadows are overlain by bowel gas
and feces making assessment difficult. There is a moderate amount of
feces particular in the right colon. No acute bony abnormality is
seen. However marked abnormality of the left hip is noted which
could be degenerative, but avascular necrosis cannot be excluded.
IMPRESSION: 1. No renal or ureteral calculi are evident by plain film.
Assessment is limited by overlying bowel gas and feces.
2. Nonspecific bowel gas pattern.
3. Abnormality of the left femoral head may be degenerative in
origin, but avascular necrosis cannot be excluded.

## 2019-04-17 ENCOUNTER — Other Ambulatory Visit: Payer: Self-pay | Admitting: Family Medicine

## 2019-04-17 DIAGNOSIS — N529 Male erectile dysfunction, unspecified: Secondary | ICD-10-CM

## 2019-04-17 NOTE — Telephone Encounter (Signed)
Routing to provider  

## 2019-04-17 NOTE — Telephone Encounter (Signed)
Requested medication (s) are due for refill today: yes  Requested medication (s) are on the active medication list: yes  Last refill:  08/28/2018  Future visit scheduled:yes  Notes to clinic:  Review for refill   Requested Prescriptions  Pending Prescriptions Disp Refills   sildenafil (REVATIO) 20 MG tablet [Pharmacy Med Name: SILDENAFIL 20 MG TABLET] 270 tablet 4    Sig: Take 1-5 tabs 30 minutes prior to intercourse     Urology: Erectile Dysfunction Agents Passed - 04/17/2019  2:03 PM      Passed - Last BP in normal range    BP Readings from Last 1 Encounters:  03/12/19 134/74         Passed - Valid encounter within last 12 months    Recent Outpatient Visits          1 month ago Essential hypertension   [place], [name], DO   7 months ago Routine general medical examination at a health care facility   [facility], [place], DO   1 year ago Essential hypertension   [place], [place], DO   1 year ago Low back pain, unspecified back pain laterality, unspecified chronicity, with sciatica presence unspecified   [place], [place], DO   1 year ago Routine general medical examination at a health care facility   [place], [place], DO      Future Appointments            In 4 months  [place], [place]   In 4 months Wynetta Emery, Barb Merino, [place], [facility]

## 2019-04-27 ENCOUNTER — Ambulatory Visit: Payer: Medicare Other

## 2019-04-30 ENCOUNTER — Other Ambulatory Visit: Payer: Self-pay

## 2019-04-30 ENCOUNTER — Ambulatory Visit (INDEPENDENT_AMBULATORY_CARE_PROVIDER_SITE_OTHER): Payer: Medicare Other

## 2019-04-30 DIAGNOSIS — Z23 Encounter for immunization: Secondary | ICD-10-CM | POA: Diagnosis not present

## 2019-05-27 ENCOUNTER — Other Ambulatory Visit: Payer: Self-pay | Admitting: Family Medicine

## 2019-05-29 ENCOUNTER — Other Ambulatory Visit: Payer: Self-pay | Admitting: Family Medicine

## 2019-05-29 NOTE — Telephone Encounter (Signed)
Copied from [place] [id number]. Topic: Quick Communication - Rx Refill/Question >> May 29, 2019  4:40 PM Rainey Pines A wrote: Medication: valACYclovir (VALTREX) 500 MG tablet   Has the patient contacted their pharmacy? Yes (Agent: If no, request that the patient contact the pharmacy for the refill.) (Agent: If yes, when and what did the pharmacy advise?)Contact PCP  Preferred Pharmacy (with phone number or street name): [id number] - [place], [address] [phone] (Phone) [phone] (Fax)    Agent: Please be advised that RX refills may take up to 3 business days. We ask that you follow-up with your pharmacy.

## 2019-05-30 MED ORDER — VALACYCLOVIR HCL 500 MG PO TABS: 500.0000 mg | ORAL_TABLET | Freq: Two times a day (BID) | ORAL | 5 refills | Status: DC | PRN

## 2019-05-30 NOTE — Telephone Encounter (Signed)
Can we check to see if he requested this? Please find out what kind of outbreak it is

## 2019-05-30 NOTE — Telephone Encounter (Signed)
Routing to provider  

## 2019-05-30 NOTE — Telephone Encounter (Signed)
Called and spoke with patient, he states that he is not having a break out but he has the feeling that one is about to start, he states that has not had an actual break out in 4-5 years, because when he gets this feeling he will take the valtrex to prevent it.

## 2019-07-17 ENCOUNTER — Telehealth: Payer: Self-pay | Admitting: Family Medicine

## 2019-07-17 NOTE — Chronic Care Management (AMB) (Signed)
  Chronic Care Management   Note  07/17/2019 Name: Nathan Cooper MRN: [id number] DOB: [birth year]  Nathan Cooper is a 73 y.o. year old male who is a primary care patient of Valerie Roys, DO. I reached out to [employer] by phone today in response to a referral sent by [name]'s health plan.     Mr. Sales was given information about Chronic Care Management services today including:  1. CCM service includes personalized support from designated clinical staff supervised by his physician, including individualized plan of care and coordination with other care providers 2. 24/7 contact phone numbers for assistance for urgent and routine care needs. 3. Service will only be billed when office clinical staff spend 20 minutes or more in a month to coordinate care. 4. Only one practitioner may furnish and bill the service in a calendar month. 5. The patient may stop CCM services at any time (effective at the end of the month) by phone call to the office staff. 6. The patient will be responsible for cost sharing (co-pay) of up to 20% of the service fee (after annual deductible is met).  Patient did not agree to enrollment in care management services and does not wish to consider at this time.  Follow up plan: The patient has been provided with contact information for the chronic care management team and has been advised to call with any health related questions or concerns.   [place], [place] [address] Direct Dial: [email or web address]  Website: [email or web address]

## 2019-08-07 ENCOUNTER — Telehealth: Payer: Self-pay | Admitting: Family Medicine

## 2019-08-07 NOTE — Telephone Encounter (Signed)
Copied from [place] [id number]. Topic: General - Other >> Aug 07, 2019 12:00 PM Wynetta Emery, [place] C wrote: Reason for CRM: pt called in for assistance. Pt says that he is having muscle pain in his leg and back. Pt would like to know if provider could send in something to his pharmacy?    [id number] - [place], [address]  Phone:  [phone] Fax:  [phone]

## 2019-08-07 NOTE — Telephone Encounter (Signed)
Called pt scheduled for 08/08/2019

## 2019-08-07 NOTE — Telephone Encounter (Signed)
Please schedule appointment. Virtual OK

## 2019-08-07 NOTE — Telephone Encounter (Signed)
Routing to provider to advise.  

## 2019-08-08 ENCOUNTER — Telehealth (INDEPENDENT_AMBULATORY_CARE_PROVIDER_SITE_OTHER): Payer: Medicare Other | Admitting: Family Medicine

## 2019-08-08 ENCOUNTER — Encounter: Payer: Self-pay | Admitting: Family Medicine

## 2019-08-08 DIAGNOSIS — M5442 Lumbago with sciatica, left side: Secondary | ICD-10-CM | POA: Diagnosis not present

## 2019-08-08 MED ORDER — NAPROXEN 500 MG PO TABS: 500.0000 mg | ORAL_TABLET | Freq: Two times a day (BID) | ORAL | 3 refills | Status: DC

## 2019-08-08 MED ORDER — CYCLOBENZAPRINE HCL 10 MG PO TABS: 10.0000 mg | ORAL_TABLET | Freq: Every day | ORAL | 1 refills | Status: DC

## 2019-08-08 NOTE — Progress Notes (Signed)
There were no vitals taken for this visit.   Subjective:    Patient ID: Nathan Cooper, male    DOB: December 21, 1945, 74 y.o.   MRN: [id number]  HPI: Nathan Cooper is a 74 y.o. male  Chief Complaint  Patient presents with  . Back Pain    lower back X 1 week, has tried some otc meds and changed where he sits and works, went away for about a day and came back to his left buttucks and down his leg   BACK PAIN Duration: 1 week Mechanism of injury: no trauma Location: low back down into his L buttock Onset: gradual Severity: severe Quality: sharp and shooting Frequency: constant Radiation: L leg above the knee Aggravating factors: sitting Alleviating factors: heat and ice, ibuprofen Status: worse Treatments attempted: rest, ice, heat, APAP, ibuprofen and aleve  Relief with NSAIDs?: mild Nighttime pain:  no Paresthesias / decreased sensation:  no Bowel / bladder incontinence:  no Fevers:  no Dysuria / urinary frequency:  no  Relevant past medical, surgical, family and social history reviewed and updated as indicated. Interim medical history since our last visit reviewed. Allergies and medications reviewed and updated.  Review of Systems  Constitutional: Negative.   Respiratory: Negative.   Cardiovascular: Negative.   Gastrointestinal: Negative.   Musculoskeletal: Positive for back pain and myalgias. Negative for arthralgias, gait problem, joint swelling, neck pain and neck stiffness.  Skin: Negative.   Neurological: Negative.   Psychiatric/Behavioral: Negative.     Per HPI unless specifically indicated above     Objective:    There were no vitals taken for this visit.  Wt Readings from Last 3 Encounters:  03/12/19 215 lb (97.5 kg)  08/28/18 217 lb 4.8 oz (98.6 kg)  08/28/18 217 lb 4.8 oz (98.6 kg)    Physical Exam Vitals and nursing note reviewed.  Constitutional:      General: He is not in acute distress.    Appearance: Normal appearance. He is not  ill-appearing, toxic-appearing or diaphoretic.  HENT:     Head: Normocephalic and atraumatic.     Right Ear: External ear normal.     Left Ear: External ear normal.     Nose: Nose normal.     Mouth/Throat:     Mouth: Mucous membranes are moist.     Pharynx: Oropharynx is clear.  Eyes:     General: No scleral icterus.       Right eye: No discharge.        Left eye: No discharge.     Conjunctiva/sclera: Conjunctivae normal.     Pupils: Pupils are equal, round, and reactive to light.  Pulmonary:     Effort: Pulmonary effort is normal. No respiratory distress.     Comments: Speaking in full sentences Musculoskeletal:        General: Normal range of motion.     Cervical back: Normal range of motion.  Skin:    Coloration: Skin is not jaundiced or pale.     Findings: No bruising, erythema, lesion or rash.  Neurological:     Mental Status: He is alert and oriented to person, place, and time. Mental status is at baseline.  Psychiatric:        Mood and Affect: Mood normal.        Behavior: Behavior normal.        Thought Content: Thought content normal.        Judgment: Judgment normal.     Results for  orders placed or performed in visit on [id number]  Basic metabolic panel  Result Value Ref Range   Glucose 106 (H) 65 - 99 mg/dL   BUN 10 8 - 27 mg/dL   Creatinine, Ser 1.14 0.76 - 1.27 mg/dL   GFR calc non Af Amer 64 >59 mL/min/1.73   GFR calc Af Amer 74 >59 mL/min/1.73   BUN/Creatinine Ratio 9 (L) 10 - 24   Sodium 136 134 - 144 mmol/L   Potassium 4.4 3.5 - 5.2 mmol/L   Chloride 100 96 - 106 mmol/L   CO2 21 20 - 29 mmol/L   Calcium 9.2 8.6 - 10.2 mg/dL      Assessment & Plan:   Problem List Items Addressed This Visit    None    Visit Diagnoses    Acute bilateral low back pain with left-sided sciatica    -  Primary   Will treat with exercises, flexeril, and naproxen. Call with any concerns. Continue to monitor. If not better by Friday- prednisone.    Relevant Medications     cyclobenzaprine (FLEXERIL) 10 MG tablet   naproxen (NAPROSYN) 500 MG tablet       Follow up plan: Return if symptoms worsen or fail to improve.   . This visit was completed via mychart due to the restrictions of the COVID-19 pandemic. All issues as above were discussed and addressed. Physical exam was done as above through visual confirmation on mychart. If it was felt that the patient should be evaluated in the office, they were directed there. The patient verbally consented to this visit. . Location of the patient: home . Location of the provider: home . Those involved with this call:  . Provider: Park Liter, DO . CMA: Tiffany Reel, CMA . Front Desk/Registration: Don Perking  . Time spent on call: 15 minutes with patient face to face via video conference. More than 50% of this time was spent in counseling and coordination of care. 23 minutes total spent in review of patient's record and preparation of their chart.

## 2019-08-08 NOTE — Patient Instructions (Signed)

## 2019-08-10 ENCOUNTER — Telehealth: Payer: Self-pay

## 2019-08-10 NOTE — Telephone Encounter (Signed)
Called and spoke with patient, he states that he is getting some better, still having tightness in his back, informed him to give it the weekend and if not better by Monday than give [place] a call back.  Copied from [place] [phone]. Topic: General - Other >> Aug 09, 2019  4:41 PM Yvette Rack wrote: Reason for CRM: Pt stated that he would like to make Dr. Wynetta Emery aware that the medication that he was prescribed is not working and he would like her to call him back.

## 2019-08-10 NOTE — Telephone Encounter (Signed)
Agree with below. Addendum:    I believe that she has limits with moving and including, toileting, bathing, feeding, dressing and grooming. I believe the power wheelchair is needed for pt to be able to perform ADL's in her home

## 2019-08-13 NOTE — Telephone Encounter (Signed)
Pt stated he is still having discomfort in back and did not get better over the weekend. He would like to know what Dr. Wynetta Cooper wants to do next. Please advise.

## 2019-08-14 NOTE — Telephone Encounter (Signed)
Pt called back requesting FU from PCP, pt is in a lot of pain  Best contact: [phone]

## 2019-08-15 NOTE — Telephone Encounter (Signed)
Scheduled pt for tomorrow with [name]

## 2019-08-15 NOTE — Telephone Encounter (Signed)
Needs appt or UC

## 2019-08-16 ENCOUNTER — Encounter: Payer: Self-pay | Admitting: Nurse Practitioner

## 2019-08-16 ENCOUNTER — Ambulatory Visit (INDEPENDENT_AMBULATORY_CARE_PROVIDER_SITE_OTHER): Payer: Medicare Other | Admitting: Nurse Practitioner

## 2019-08-16 ENCOUNTER — Other Ambulatory Visit: Payer: Self-pay

## 2019-08-16 VITALS — BP 134/69 | HR 94 | Temp 98.0°F | Ht 71.0 in | Wt 215.0 lb

## 2019-08-16 DIAGNOSIS — M5442 Lumbago with sciatica, left side: Secondary | ICD-10-CM

## 2019-08-16 MED ORDER — KETOROLAC TROMETHAMINE 30 MG/ML IJ SOLN: 30.0000 mg | Freq: Once | INTRAMUSCULAR | Status: DC

## 2019-08-16 MED ORDER — KETOROLAC TROMETHAMINE 60 MG/2ML IM SOLN
60.0000 mg | Freq: Once | INTRAMUSCULAR | Status: AC
  Administered 2019-08-16: 30 mg via INTRAMUSCULAR

## 2019-08-16 MED ORDER — PREDNISONE 50 MG PO TABS: 50.0000 mg | ORAL_TABLET | Freq: Every day | ORAL | 0 refills | Status: DC

## 2019-08-16 NOTE — Patient Instructions (Signed)
Sciatica Rehab Ask your health care provider which exercises are safe for you. Do exercises exactly as told by your health care provider and adjust them as directed. It is normal to feel mild stretching, pulling, tightness, or discomfort as you do these exercises. Stop right away if you feel sudden pain or your pain gets worse. Do not begin these exercises until told by your health care provider. Stretching and range-of-motion exercises These exercises warm up your muscles and joints and improve the movement and flexibility of your hips and back. These exercises also help to relieve pain, numbness, and tingling. Sciatic nerve glide 1. Sit in a chair with your head facing down toward your chest. Place your hands behind your back. Let your shoulders slump forward. 2. Slowly straighten one of your legs while you tilt your head back as if you are looking toward the ceiling. Only straighten your leg as far as you can without making your symptoms worse. 3. Hold this position for __________ seconds. 4. Slowly return to the starting position. 5. Repeat with your other leg. Repeat __________ times. Complete this exercise __________ times a day. Knee to chest with hip adduction and internal rotation  1. Lie on your back on a firm surface with both legs straight. 2. Bend one of your knees and move it up toward your chest until you feel a gentle stretch in your lower back and buttock. Then, move your knee toward the shoulder that is on the opposite side from your leg. This is hip adduction and internal rotation. ? Hold your leg in this position by holding on to the front of your knee. 3. Hold this position for __________ seconds. 4. Slowly return to the starting position. 5. Repeat with your other leg. Repeat __________ times. Complete this exercise __________ times a day. Prone extension on elbows  1. Lie on your abdomen on a firm surface. A bed may be too soft for this exercise. 2. Prop yourself up on  your elbows. 3. Use your arms to help lift your chest up until you feel a gentle stretch in your abdomen and your lower back. ? This will place some of your body weight on your elbows. If this is uncomfortable, try stacking pillows under your chest. ? Your hips should stay down, against the surface that you are lying on. Keep your hip and back muscles relaxed. 4. Hold this position for __________ seconds. 5. Slowly relax your upper body and return to the starting position. Repeat __________ times. Complete this exercise __________ times a day. Strengthening exercises These exercises build strength and endurance in your back. Endurance is the ability to use your muscles for a long time, even after they get tired. Pelvic tilt This exercise strengthens the muscles that lie deep in the abdomen. 1. Lie on your back on a firm surface. Bend your knees and keep your feet flat on the floor. 2. Tense your abdominal muscles. Tip your pelvis up toward the ceiling and flatten your lower back into the floor. ? To help with this exercise, you may place a small towel under your lower back and try to push your back into the towel. 3. Hold this position for __________ seconds. 4. Let your muscles relax completely before you repeat this exercise. Repeat __________ times. Complete this exercise __________ times a day. Alternating arm and leg raises  1. Get on your hands and knees on a firm surface. If you are on a hard floor, you may want to use   padding, such as an exercise mat, to cushion your knees. 2. Line up your arms and legs. Your hands should be directly below your shoulders, and your knees should be directly below your hips. 3. Lift your left leg behind you. At the same time, raise your right arm and straighten it in front of you. ? Do not lift your leg higher than your hip. ? Do not lift your arm higher than your shoulder. ? Keep your abdominal and back muscles tight. ? Keep your hips facing the  ground. ? Do not arch your back. ? Keep your balance carefully, and do not hold your breath. 4. Hold this position for __________ seconds. 5. Slowly return to the starting position. 6. Repeat with your right leg and your left arm. Repeat __________ times. Complete this exercise __________ times a day. Posture and body mechanics Good posture and healthy body mechanics can help to relieve stress in your body's tissues and joints. Body mechanics refers to the movements and positions of your body while you do your daily activities. Posture is part of body mechanics. Good posture means:  Your spine is in its natural S-curve position (neutral).  Your shoulders are pulled back slightly.  Your head is not tipped forward. Follow these guidelines to improve your posture and body mechanics in your everyday activities. Standing   When standing, keep your spine neutral and your feet about hip width apart. Keep a slight bend in your knees. Your ears, shoulders, and hips should line up.  When you do a task in which you stand in one place for a long time, place one foot up on a stable object that is 2-4 inches (5-10 cm) high, such as a footstool. This helps keep your spine neutral. Sitting   When sitting, keep your spine neutral and keep your feet flat on the floor. Use a footrest, if necessary, and keep your thighs parallel to the floor. Avoid rounding your shoulders, and avoid tilting your head forward.  When working at a desk or a computer, keep your desk at a height where your hands are slightly lower than your elbows. Slide your chair under your desk so you are close enough to maintain good posture.  When working at a computer, place your monitor at a height where you are looking straight ahead and you do not have to tilt your head forward or downward to look at the screen. Resting  When lying down and resting, avoid positions that are most painful for you.  If you have pain with activities  such as sitting, bending, stooping, or squatting, lie in a position in which your body does not bend very much. For example, avoid curling up on your side with your arms and knees near your chest (fetal position).  If you have pain with activities such as standing for a long time or reaching with your arms, lie with your spine in a neutral position and bend your knees slightly. Try the following positions: ? Lying on your side with a pillow between your knees. ? Lying on your back with a pillow under your knees. Lifting   When lifting objects, keep your feet at least shoulder width apart and tighten your abdominal muscles.  Bend your knees and hips and keep your spine neutral. It is important to lift using the strength of your legs, not your back. Do not lock your knees straight out.  Always ask for help to lift heavy or awkward objects. This information is not   intended to replace advice given to you by your health care provider. Make sure you discuss any questions you have with your health care provider. Document Revised: 10/27/2018 Document Reviewed: 07/27/2018 Elsevier Patient Education  2020 [employer]  

## 2019-08-16 NOTE — Addendum Note (Signed)
Addended by: Sandria Manly on: 08/16/2019 11:35 AM   Modules accepted: Orders

## 2019-08-16 NOTE — Progress Notes (Signed)
BP 134/69 (BP Location: Left Arm, Patient Position: Sitting, Cuff Size: Normal)   Pulse 94   Temp 98 F (36.7 C) (Oral)   Ht 5\' 11"  (1.803 m)   Wt 215 lb (97.5 kg)   SpO2 100%   BMI 29.99 kg/m    Subjective:    Patient ID: Nathan Cooper, male    DOB: Jun 17, 1946, 74 y.o.   MRN: [id number]  HPI: Nathan Cooper is a 74 y.o. male  Chief Complaint  Patient presents with  . Back Pain    Pain and muscle tightness ongoing 2 weeks. Radiating to left glute and thigh.  . Leg Pain   BACK PAIN Duration: weeks - 2; started as low back pain and now is in glute and thigh Mechanism of injury: lifting couch about 2 weeks ago Location: Left Onset: sudden Severity: severe Quality: sharp  Frequency: intermittent Radiation: L leg above the knee Aggravating factors: lifting, movement, walking, laying and coughing Alleviating factors: icy hot, flexeril, naproxen and heat Status: stable, but worse at end of day Treatments attempted: heat, APAP and aleve  Relief with NSAIDs?: moderate Nighttime pain:  yes Paresthesias / decreased sensation:  no Bowel / bladder incontinence:  no Fevers:  no Dysuria / urinary frequency:  no  No Known Allergies  Outpatient Encounter Medications as of 08/16/2019  Medication Sig  . amLODipine (NORVASC) 10 MG tablet Take 1 tablet (10 mg total) by mouth daily.  Marland Kitchen aspirin 81 MG EC tablet Take 1 tablet (81 mg total) by mouth daily.  . benazepril (LOTENSIN) 40 MG tablet Take 1 tablet (40 mg total) by mouth daily.  . cyclobenzaprine (FLEXERIL) 10 MG tablet Take 1 tablet (10 mg total) by mouth at bedtime.  . naproxen (NAPROSYN) 500 MG tablet Take 1 tablet (500 mg total) by mouth 2 (two) times daily with a meal.  . sildenafil (REVATIO) 20 MG tablet TAKE 1-5 TABS 30 MINUTES PRIOR TO INTERCOURSE  . triamcinolone ointment (KENALOG) 0.5 % APPLY TO AFFECTED AREA TWICE A DAY  . valACYclovir (VALTREX) 500 MG tablet Take 1 tablet (500 mg total) by mouth 2 (two)  times daily as needed.  . predniSONE (DELTASONE) 50 MG tablet Take 1 tablet (50 mg total) by mouth daily with breakfast.   Facility-Administered Encounter Medications as of 08/16/2019  Medication  . ketorolac (TORADOL) 30 MG/ML injection 30 mg   Patient Active Problem List   Diagnosis Date Noted  . Acute left-sided low back pain with left-sided sciatica 08/16/2019  . Special screening for malignant neoplasms, colon   . Polyp of sigmoid colon   . Benign neoplasm of cecum   . ED (erectile dysfunction) 02/11/2016  . OSA on CPAP 09/04/2015  . CAD (coronary artery disease) 09/01/2015  . Atherosclerosis of aorta ([place]) 09/01/2015  . COPD (chronic obstructive pulmonary disease) ([place]) 09/01/2015  . Personal history of tobacco use, presenting hazards to health 08/26/2015  . Tobacco abuse 08/14/2015  . HTN (hypertension) 12/25/2014  . Hep C w/o coma, chronic ([place]) 12/25/2014  . Hernia, inguinal    Past Medical History:  Diagnosis Date  . COPD (chronic obstructive pulmonary disease) ([place])   . Coronary artery disease   . Hepatitis C   . Hernia, inguinal 2014  . Hypertension 2009  . IFG (impaired fasting glucose) 07/22/2015  . Personal history of tobacco use, presenting hazards to health 08/26/2015  . Sleep apnea    Relevant past medical, surgical, family and social history reviewed and updated as indicated.  Interim medical history since our last visit reviewed.  Review of Systems  Constitutional: Negative.   Genitourinary: Negative.  Negative for difficulty urinating, dysuria, frequency and urgency.  Musculoskeletal: Positive for back pain and myalgias.  Skin: Negative.   Neurological: Negative.  Negative for dizziness, weakness and numbness.  Psychiatric/Behavioral: Positive for sleep disturbance. Negative for behavioral problems and confusion. The patient is not nervous/anxious.    Per HPI unless specifically indicated above    Objective:    BP 134/69 (BP Location: Left Arm, Patient  Position: Sitting, Cuff Size: Normal)   Pulse 94   Temp 98 F (36.7 C) (Oral)   Ht 5\' 11"  (1.803 m)   Wt 215 lb (97.5 kg)   SpO2 100%   BMI 29.99 kg/m   Wt Readings from Last 3 Encounters:  08/16/19 215 lb (97.5 kg)  03/12/19 215 lb (97.5 kg)  08/28/18 217 lb 4.8 oz (98.6 kg)    Physical Exam Constitutional:      General: He is not in acute distress.    Appearance: Normal appearance. He is normal weight. He is not ill-appearing or toxic-appearing.  Musculoskeletal:        General: No swelling.     Cervical back: Normal range of motion. No rigidity or tenderness.     Right hip: Normal strength.     Left hip: Normal strength.     Right upper leg: No bony tenderness.     Left upper leg: Bony tenderness present.       Legs:     Comments: Full ROM to left lower extremity, painful with leg extension  Skin:    General: Skin is warm and dry.     Coloration: Skin is not jaundiced or pale.  Neurological:     General: No focal deficit present.     Mental Status: He is alert and oriented to person, place, and time.     Motor: No weakness.     Gait: Gait normal.  Psychiatric:        Mood and Affect: Mood normal.        Behavior: Behavior normal.        Thought Content: Thought content normal.        Judgment: Judgment normal.       Assessment & Plan:   Problem List Items Addressed This Visit      Other   Acute left-sided low back pain with left-sided sciatica - Primary    Acute, ongoing.  No red flags on examination.  Given persistent pain with flexeril and naproxen, will given Toradol IM in office today and start Prednisone burst tomorrow.  Will also refer to outpatient physical therapy.  Stretching discussed with patient.  Referral discussed with patient and patient in agreement.  May need x-ray if pain continues to be persistent.  Go to ED with sudden decrease in sensation of left leg, altered mental status, or incontinence.      Relevant Medications   predniSONE  (DELTASONE) 50 MG tablet   ketorolac (TORADOL) 30 MG/ML injection 30 mg   Other Relevant Orders   Ambulatory referral to Physical Therapy       Follow up plan: Return if symptoms worsen or fail to improve.

## 2019-08-16 NOTE — Assessment & Plan Note (Addendum)
Acute, ongoing.  No red flags on examination.  Given persistent pain with flexeril and naproxen, will given Toradol IM in office today and start Prednisone burst tomorrow.  Will also refer to outpatient physical therapy.  Stretching discussed with patient.  Referral discussed with patient and patient in agreement.  May need x-ray if pain continues to be persistent.  Go to ED with sudden decrease in sensation of left leg, altered mental status, or incontinence.

## 2019-08-20 ENCOUNTER — Other Ambulatory Visit: Payer: Self-pay | Admitting: Family Medicine

## 2019-08-20 DIAGNOSIS — M5442 Lumbago with sciatica, left side: Secondary | ICD-10-CM

## 2019-08-20 NOTE — Telephone Encounter (Signed)
Requested medication (s) are due for refill today: yes  Requested medication (s) are on the active medication list: yes  Last refill:  08/16/19  Future visit scheduled: yes  Notes to clinic:  medication not delegated to NT to refill   Requested Prescriptions  Pending Prescriptions Disp Refills   predniSONE (DELTASONE) 50 MG tablet 3 tablet 0    Sig: Take 1 tablet (50 mg total) by mouth daily with breakfast.      Not Delegated - Endocrinology:  Oral Corticosteroids Failed - 08/20/2019  1:50 PM      Failed - This refill cannot be delegated      Passed - Last BP in normal range    BP Readings from Last 1 Encounters:  08/16/19 134/69          Passed - Valid encounter within last 6 months    Recent Outpatient Visits           4 days ago Acute left-sided low back pain with left-sided sciatica   [facility] Carnella Guadalajara I, NP   1 week ago Acute bilateral low back pain with left-sided sciatica   [facility] [place], [name], DO   5 months ago Essential hypertension   [place], [name], DO   11 months ago Routine general medical examination at a health care facility   [place], [place], DO   1 year ago Essential hypertension   [place], [place], DO       Future Appointments             In 2 weeks  [facility]   In 2 weeks Wynetta Emery, Barb Merino, DO [employer], [facility]

## 2019-08-20 NOTE — Telephone Encounter (Signed)
Medication Refill - Medication:  predniSONE (DELTASONE) 50 MG tablet   Has the patient contacted their pharmacy?  Yes advised to call office. Pt states they really help his back. Please call when filled  Preferred Pharmacy (with phone number or street name):  [id number] - [place], [address] Phone:  [phone]  Fax:  [phone]     Agent: Please be advised that RX refills may take up to 3 business days. We ask that you follow-up with your pharmacy.

## 2019-08-20 NOTE — Telephone Encounter (Signed)
Routing to provider  

## 2019-08-20 NOTE — Telephone Encounter (Signed)
Does patient need an appointment for further refills? Patient states the medication really helps with his back.

## 2019-08-21 MED ORDER — PREDNISONE 50 MG PO TABS: 50.0000 mg | ORAL_TABLET | Freq: Every day | ORAL | 0 refills | Status: DC

## 2019-08-21 NOTE — Addendum Note (Signed)
Addended by: Carnella Guadalajara I on: 08/21/2019 09:12 AM   Modules accepted: Orders

## 2019-08-21 NOTE — Telephone Encounter (Signed)
I will give another short burst of prednisone for 3 days - [facility] [place].  Please explain to the patient that this medication is not a long-term fix for his back pain and he really needs to get in with PT to start back exercises/rehab.  It looks like his referral for PT has been authorized by insurance - if PT has not contacted him, he can contact them at [phone] to set up appointment.

## 2019-08-21 NOTE — Telephone Encounter (Signed)
Patient notified and verbalized understanding. Number given for PT.

## 2019-08-21 NOTE — Telephone Encounter (Signed)
Tried calling patient, no answer.  LVM for patient to return phone call.

## 2019-08-29 ENCOUNTER — Ambulatory Visit: Payer: Medicare Other | Attending: Nurse Practitioner | Admitting: Physical Therapy

## 2019-08-29 ENCOUNTER — Other Ambulatory Visit: Payer: Self-pay

## 2019-08-29 ENCOUNTER — Encounter: Payer: Self-pay | Admitting: Physical Therapy

## 2019-08-29 DIAGNOSIS — M25552 Pain in left hip: Secondary | ICD-10-CM | POA: Diagnosis not present

## 2019-08-29 DIAGNOSIS — M79652 Pain in left thigh: Secondary | ICD-10-CM | POA: Insufficient documentation

## 2019-08-29 DIAGNOSIS — M2560 Stiffness of unspecified joint, not elsewhere classified: Secondary | ICD-10-CM | POA: Insufficient documentation

## 2019-08-29 DIAGNOSIS — M6289 Other specified disorders of muscle: Secondary | ICD-10-CM | POA: Diagnosis not present

## 2019-08-29 NOTE — Therapy (Signed)
[facility] [address]. [place], [place], [address] Phone: [phone]   Fax:  [phone]  Physical Therapy Treatment  Patient Details  Name: Nathan Cooper MRN: [id number] Date of Birth: [birth year] Referring Provider (PT): Carnella Guadalajara NP   Encounter Date: 08/29/2019  PT End of Session - 08/29/19 1150    Visit Number  1    Number of Visits  5    Date for PT Re-Evaluation  09/26/19    PT Start Time  0901    PT Stop Time  1008    PT Time Calculation (min)  67 min    Activity Tolerance  Patient tolerated treatment well;Patient limited by pain    Behavior During Therapy  [facility] for tasks assessed/performed       Past Medical History:  Diagnosis Date  . COPD (chronic obstructive pulmonary disease) ([place])   . Coronary artery disease   . Hepatitis C   . Hernia, inguinal 2014  . Hypertension 2009  . IFG (impaired fasting glucose) 07/22/2015  . Personal history of tobacco use, presenting hazards to health 08/26/2015  . Sleep apnea     Past Surgical History:  Procedure Laterality Date  . COLONOSCOPY  2010  . COLONOSCOPY WITH PROPOFOL N/A 09/21/2017   Procedure: COLONOSCOPY WITH PROPOFOL;  Surgeon: Virgel Manifold, MD;  Location: [facility] ENDOSCOPY;  Service: Endoscopy;  Laterality: N/A;  . HERNIA REPAIR  2014    There were no vitals filed for this visit.  Subjective Assessment - 08/29/19 1139    Subjective  Pt. is a pleasant 74 y/o male who presents with L sided back pain with occasional shooting sensation to the lateral L hip/thigh region. Pt. reported that the onset began after his first COVID 19 vaccination injection in early/mid January 2021. Pt. denies having the same pain in the past and no pertinent PMH. Pt. expresses increased n/t in the lower L leg but not in the upper L leg. Pt. reports pain now 3/10, best 3/10, and worst 9/10. Pt. denies any bowel and bladder sensation or function changes. Pt. reports symptoms are  aggravated with sitting for > 10-15 minutes, going up stairs, and getting L leg in and out of the car. Pt. reports his pain is eased by bending forward while walking and changing positions. Pt. was recently prescribed prednisone to decrease pain.    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 Pt. 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    How long can you sit comfortably?  10 - 15 minutes    How long can you walk comfortably?  10 minutes    Patient Stated Goals  Decrease pain with work and lesiure activities    Currently in Pain?  Yes    Pain Score  3     Pain Location  Hip    Pain Orientation  Left;Anterior;Posterior;Lateral    Pain Descriptors / Indicators  Tightness;Shooting    Pain Type  Chronic pain    Pain Radiating Towards  Pain radiates down the lateral L thigh    Pain Onset  More than a month ago    Pain Frequency  Constant    Aggravating Factors   Sitting for prolonged periods, getting in and out of the car    Pain Relieving Factors  Heating pad, bending while walking, moving from sitting to standing    Effect of Pain on  Daily Activities  Increased pain with all normal activites         [facility] PT Assessment - 08/29/19 0001      Assessment   Medical Diagnosis  Acute left-sided low back pain with left-sided sciatica    Referring Provider (PT)  Carnella Guadalajara NP    Onset Date/Surgical Date  07/20/19    Hand Dominance  Right    Prior Therapy  none      Precautions   Precautions  None      Restrictions   Weight Bearing Restrictions  No      Balance Screen   Has the patient fallen in the past 6 months  No      [place] residence      Prior Function   Level of Independence  Independent      Cognition   Overall Cognitive Status  Within Functional Limits for tasks assessed       Subjective Chief Concern: Pain and tightness in the L hip/buttock and thigh with  occasional shooting pains down the lateral L hip, numbness in the L lower leg Occupation demands: Pt's occupation requires increased sitting and driving time  Current Onset: Pt. Reports that pain began the beginning to mid January after his 1st COVID 19 Vaccination injection Previous Episodes: Denies previous pain Pain Score: Now 3/10, Best 3/10, Worst 9/10  Pain description: Tightness/tense in the L buttock/thigh, numbness in the L lower leg Aggravating Factors: Sitting > 10 minutes, getting in and out of the car, stair climbing  Easing Factors: Bending while walking, moving from sitting to standing Medications: See chart; recently prescribed prednisone to decrease pain Goals: Decrease pain with normal activities   Objective Observation: Pt. Ambulated with slight antalgic gait with decreased stride length on L compared to right and noted increase lateral trunk lean with ambulation. Pt. Had decreased lumbar and cervical lordosis with seated posture.  Functional Activity: Pt. Required heavy UE support when stepping up with the L leg and reports being unable to climb stairs without UE support  Screening of Related Regions Lumbar ROM with overpressure: WNL with increased tightness in posterior chain with overpressure, pain with lumbar extension  Knee Flexion/Extension: WNL  Lower Quarter Neurological Screen: Negative for radiculopathy  Lumbar Spine Passive Accessory Intervertebral Movement CPAs L1-L5 all hypomobile. No reproduction of the concordant pain  Active ROM of Hip Flexion: 120 degs Extension: Deferred External Rotation: Deferred  (limited ROM with performing figure 4 stretch) Internal Rotation: Deferred  Muscle Length Testing L Proximal Hamstring length 60 degs L Distal Hamstring length 120 degs   Manual Muscle Testing Hip flexion L 4/5   R 4+/5 Hip Abduction and Adduction 5/5 Knee flexion 5/5 bilaterally Knee extension 5/5 bilaterally Ankle DF 5/5 bilaterally Ankle PF  4-/5 bilaterally  Palpation No tenderness to palpation of the lumbar paraspinals, glute medius, or piriformis   Special Tests Slump Test (-) SLR (-) *noted increased tightness in hamstring but no reproduction of concordant pain   Outcome Measure: FOTO score 54 goal 67  Interventions: Supine proximal hamstring stretching 3 x 30 seconds Patient Education: Pt. Was educated on important of taking more breaks from sitting and encouraged to move more throughout the day. Pt. Was educated on proper use of ice to decrease nerve related symptoms compared to using heat which may cause more irritation. Pt. Was educated on HEP (see below) and returned demonstration of all exercises before finishing treatment.  HEP: Seated hamstring  stretch and piriformis stretch, supine lumbar rotations and knee to chest stretch   Plan - 08/29/19 1305    Clinical Impression Statement  Pt. is a pleasant 73 y/o male with concerns about L sided hip and thigh pain with intermitted shooting pains down the lateral mid thigh. Pt. Describes pain as a tightness in the L buttock, quadriceps, and hamstrings. Pt. Reports pain as a 3/10 at best and 9/10 at worse. Pt. Ambulated with antalgic gait, increased lateral trunk lean and decreased step length on L side. Pt. demonstrates normal lumbar spine AROM and increased tightness in the hamstrings with overpressure. Pt. Has increased pain with lumbar extension that was not relieved with direction specific exercise but ROM is WNL. Pt. Presents with increased hypomobility of the lumbar spine CPAs but did not reproduce the patient's concordant pain. Pt. Has noted bilateral hamstring length tightness: L Proximal hamstring 60 degs, L Distal hamstring length 120 degs. Pt. Presents with grossly 5/5 LE strength bilaterally with exceptions to hip flexion (L 4/5,R 4+/5) and bilateral plantarflexion 4/5. Pt. Expressed no discomfort or concordant pain with palpation to the paraspinals, gluteus medius,  piriformis, or hamstring muscle bellies. LQNS was negative for radiculopathy. Special tests: Slump test (-) and SLR (-). FOTO Score 54 goal 67. Pt. will benefit from skilled PT services to address bilateral hamstring and glute mobility deficits and hip ROM deficits to improve work related activities and decrease pain with ADLs.    Personal Factors and Comorbidities  Profession;Comorbidity 2    Comorbidities  Hypertension, Chronic Pulmonary Disorder    Examination-Activity Limitations  Bend;Stairs;Sit;Stand    Examination-Participation Restrictions  Church    Stability/Clinical Decision Making  Stable/Uncomplicated    Clinical Decision Making  Low    Rehab Potential  Fair    PT Frequency  1x / week    PT Duration  4 weeks    PT Treatment/Interventions  ADLs/Self Care Home Management;Aquatic Therapy;Biofeedback;Cryotherapy;Electrical Stimulation;Moist Heat;Ultrasound;Gait training;Stair training;Functional mobility training;Therapeutic activities;Therapeutic exercise;Manual techniques;Passive range of motion;Dry needling;Patient/family education;Spinal Manipulations    PT Next Visit Plan  Assess Hip ROM    PT Home Exercise Plan  [email or web address]    Consulted and Agree with Plan of Care  Patient       Patient will benefit from skilled therapeutic intervention in order to improve the following deficits and impairments:  Abnormal gait, Decreased mobility, Hypomobility, Decreased range of motion, Postural dysfunction, Impaired flexibility, Decreased strength, Pain  Visit Diagnosis: Pain in left hip  Pain in left thigh  Hamstring tightness  Joint stiffness of spine     Problem List Patient Active Problem List   Diagnosis Date Noted  . Acute left-sided low back pain with left-sided sciatica 08/16/2019  . Special screening for malignant neoplasms, colon   . Polyp of sigmoid colon   . Benign neoplasm of cecum   . ED (erectile dysfunction) 02/11/2016  . OSA on CPAP 09/04/2015  . CAD (coronary  artery disease) 09/01/2015  . Atherosclerosis of aorta ([place]) 09/01/2015  . COPD (chronic obstructive pulmonary disease) ([place]) 09/01/2015  . Personal history of tobacco use, presenting hazards to health 08/26/2015  . Tobacco abuse 08/14/2015  . HTN (hypertension) 12/25/2014  . Hep C w/o coma, chronic ([place]) 12/25/2014  . Hernia, inguinal    Pura Spice, PT, DPT # [id number] Andrey Campanile, SPT 08/30/2019, 8:00 AM   [facility] [address] [place], [place], [address] Phone: [phone]   Fax:  [phone]  Name: Elonda Husky  Nathan Cooper MRN: [id number] Date of Birth: 24-Nov-1945

## 2019-08-29 NOTE — Patient Instructions (Signed)
Access Code: [email or web address]  URL: [email or web address]  Date: 08/29/2019  Prepared by: Dorcas Carrow   Exercises  Seated Hamstring Stretch - 10 reps - 3 sets - 30 seconds hold - 1x daily - 7x weekly  Seated Piriformis Stretch - 3 reps - 1 sets - 30 seconds hold - 1x daily - 7x weekly  Supine Lower Trunk Rotation - 10 reps - 1 sets - 5 seconds hold - 1x daily - 7x weekly  Supine Single Knee to Chest Stretch - 3 reps - 1 sets - 20-30 seconds hold - 1x daily - 7x weekly

## 2019-09-03 ENCOUNTER — Encounter: Payer: Self-pay | Admitting: Family Medicine

## 2019-09-03 ENCOUNTER — Ambulatory Visit (INDEPENDENT_AMBULATORY_CARE_PROVIDER_SITE_OTHER): Payer: Medicare Other

## 2019-09-03 ENCOUNTER — Ambulatory Visit (INDEPENDENT_AMBULATORY_CARE_PROVIDER_SITE_OTHER): Payer: Medicare Other | Admitting: Family Medicine

## 2019-09-03 ENCOUNTER — Other Ambulatory Visit: Payer: Self-pay

## 2019-09-03 VITALS — BP 138/72 | HR 85 | Temp 98.1°F | Ht 71.0 in | Wt 216.0 lb

## 2019-09-03 VITALS — BP 154/81 | HR 85 | Temp 98.1°F | Resp 16 | Ht 71.0 in | Wt 216.0 lb

## 2019-09-03 DIAGNOSIS — J449 Chronic obstructive pulmonary disease, unspecified: Secondary | ICD-10-CM

## 2019-09-03 DIAGNOSIS — Z0000 Encounter for general adult medical examination without abnormal findings: Secondary | ICD-10-CM

## 2019-09-03 DIAGNOSIS — Z125 Encounter for screening for malignant neoplasm of prostate: Secondary | ICD-10-CM

## 2019-09-03 DIAGNOSIS — I25118 Atherosclerotic heart disease of native coronary artery with other forms of angina pectoris: Secondary | ICD-10-CM

## 2019-09-03 DIAGNOSIS — Z720 Tobacco use: Secondary | ICD-10-CM | POA: Diagnosis not present

## 2019-09-03 DIAGNOSIS — M5442 Lumbago with sciatica, left side: Secondary | ICD-10-CM

## 2019-09-03 DIAGNOSIS — B182 Chronic viral hepatitis C: Secondary | ICD-10-CM

## 2019-09-03 DIAGNOSIS — N529 Male erectile dysfunction, unspecified: Secondary | ICD-10-CM

## 2019-09-03 DIAGNOSIS — I700 Atherosclerosis of aorta: Secondary | ICD-10-CM | POA: Diagnosis not present

## 2019-09-03 DIAGNOSIS — I10 Essential (primary) hypertension: Secondary | ICD-10-CM | POA: Diagnosis not present

## 2019-09-03 LAB — UA/M W/RFLX CULTURE, ROUTINE
Bilirubin, UA: NEGATIVE
Glucose, UA: NEGATIVE
Ketones, UA: NEGATIVE
Leukocytes,UA: NEGATIVE
Nitrite, UA: NEGATIVE
Protein,UA: NEGATIVE
Specific Gravity, UA: 1.015 (ref 1.005–1.030)
Urobilinogen, Ur: 0.2 mg/dL (ref 0.2–1.0)
pH, UA: 6.5 (ref 5.0–7.5)

## 2019-09-03 LAB — MICROSCOPIC EXAMINATION
Bacteria, UA: NONE SEEN
WBC, UA: NONE SEEN /hpf (ref 0–5)

## 2019-09-03 LAB — MICROALBUMIN, URINE WAIVED
Creatinine, Urine Waived: 50 mg/dL (ref 10–300)
Microalb, Ur Waived: 10 mg/L (ref 0–19)
Microalb/Creat Ratio: <30 mg/g (ref <30)

## 2019-09-03 MED ORDER — AMLODIPINE BESYLATE 10 MG PO TABS: 10.0000 mg | ORAL_TABLET | Freq: Every day | ORAL | 1 refills | Status: DC

## 2019-09-03 MED ORDER — SILDENAFIL CITRATE 20 MG PO TABS: ORAL_TABLET | ORAL | 4 refills | Status: DC

## 2019-09-03 MED ORDER — ASPIRIN 81 MG PO TBEC: 81.0000 mg | DELAYED_RELEASE_TABLET | Freq: Every day | ORAL | 3 refills | Status: DC

## 2019-09-03 MED ORDER — BENAZEPRIL HCL 40 MG PO TABS: 40.0000 mg | ORAL_TABLET | Freq: Every day | ORAL | 1 refills | Status: DC

## 2019-09-03 NOTE — Progress Notes (Signed)
BP 138/72 (BP Location: Left Arm, Cuff Size: Normal)   Pulse 85   Temp 98.1 F (36.7 C)   Ht 5\' 11"  (1.803 m)   Wt 216 lb (98 kg)   SpO2 98%   BMI 30.13 kg/m    Subjective:    Patient ID: Nathan Cooper, male    DOB: 12/26/1945, 74 y.o.   MRN: [id number]  HPI: Nathan Cooper is a 74 y.o. male presenting on 09/03/2019 for comprehensive medical examination. Current medical complaints include:  Back is doing a lot better. Has been using his muscle relaxer. Has started PT and that seems like it's been helping.  HYPERTENSION / HYPERLIPIDEMIA Satisfied with current treatment? yes Duration of hypertension: chronic BP monitoring frequency: not checking BP medication side effects: yes Past BP meds: amlodipine and benazepril Duration of hyperlipidemia: chronic Cholesterol medication side effects: not on anything Medication compliance: excellent compliance Aspirin: yes Recent stressors: no Recurrent headaches: no Visual changes: no Palpitations: no Dyspnea: no Chest pain: no Lower extremity edema: no Dizzy/lightheaded: no  Interim Problems from his last visit: no  Depression Screen done today and results listed below:  Depression screen [facility] 2/9 08/28/2018 08/24/2017 08/17/2016 07/20/2015 12/25/2014  Decreased Interest 0 0 0 0 0  Down, Depressed, Hopeless 0 0 0 0 0  PHQ - 2 Score 0 0 0 0 0  Altered sleeping - - - 0 -  Tired, decreased energy - - - 0 -  Change in appetite - - - 2 -  Feeling bad or failure about yourself  - - - 0 -  Trouble concentrating - - - 0 -  Moving slowly or fidgety/restless - - - 0 -  Suicidal thoughts - - - 0 -  PHQ-9 Score - - - 2 -  Difficult doing work/chores - - - Somewhat difficult -    Past Medical History:  Past Medical History:  Diagnosis Date  . COPD (chronic obstructive pulmonary disease) ([place])   . Coronary artery disease   . Hepatitis C   . Hernia, inguinal 2014  . Hypertension 2009  . IFG (impaired fasting glucose) 07/22/2015    . Personal history of tobacco use, presenting hazards to health 08/26/2015  . Sleep apnea     Surgical History:  Past Surgical History:  Procedure Laterality Date  . COLONOSCOPY  2010  . COLONOSCOPY WITH PROPOFOL N/A 09/21/2017   Procedure: COLONOSCOPY WITH PROPOFOL;  Surgeon: Virgel Manifold, MD;  Location: [facility] ENDOSCOPY;  Service: Endoscopy;  Laterality: N/A;  . HERNIA REPAIR  2014    Medications:  Current Outpatient Medications on File Prior to Visit  Medication Sig  . cyclobenzaprine (FLEXERIL) 10 MG tablet Take 1 tablet (10 mg total) by mouth at bedtime.  . naproxen (NAPROSYN) 500 MG tablet Take 1 tablet (500 mg total) by mouth 2 (two) times daily with a meal.  . triamcinolone ointment (KENALOG) 0.5 % APPLY TO AFFECTED AREA TWICE A DAY  . valACYclovir (VALTREX) 500 MG tablet Take 1 tablet (500 mg total) by mouth 2 (two) times daily as needed. (Patient not taking: Reported on 09/03/2019)   No current facility-administered medications on file prior to visit.    Allergies:  No Known Allergies  Social History:  Social History   Socioeconomic History  . Marital status: Divorced    Spouse name: Not on file  . Number of children: Not on file  . Years of education: Not on file  . Highest education level: Associate degree: academic  program  Occupational History  . Not on file  Tobacco Use  . Smoking status: Current Every Day Smoker    Packs/day: 1.00    Years: 47.50    Pack years: 47.50    Types: Cigarettes  . Smokeless tobacco: Never Used  . Tobacco comment: has had 5 since jan 2019  Substance and Sexual Activity  . Alcohol use: No    Alcohol/week: 0.0 standard drinks  . Drug use: No  . Sexual activity: Not on file  Other Topics Concern  . 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 Difficulty of  Paying Living Expenses: Not on file  Food Insecurity:   . Worried About Charity fundraiser in the Last Year: Not on file  . Ran Out of Food in the Last Year: Not on file  Transportation Needs:   . Lack of Transportation (Medical): Not on file  . Lack of Transportation (Non-Medical): Not on file  Physical Activity:   . Days of Exercise per Week: Not on file  . Minutes of Exercise per Session: Not on file  Stress:   . Feeling of Stress : Not on file  Social Connections:   . Frequency of Communication with Friends and Family: Not on file  . Frequency of Social Gatherings with Friends and Family: Not on file  . Attends Religious Services: Not on file  . Active Member of Clubs or Organizations: Not on file  . Attends Archivist Meetings: Not on file  . Marital Status: Not on file  Intimate Partner Violence:   . Fear of Current or Ex-Partner: Not on file  . Emotionally Abused: Not on file  . Physically Abused: Not on file  . Sexually Abused: Not on file   Social History   Tobacco Use  Smoking Status Current Every Day Smoker  . Packs/day: 1.00  . Years: 47.50  . Pack years: 47.50  . Types: Cigarettes  Smokeless Tobacco Never Used  Tobacco Comment   has had 5 since jan 2019   Social History   Substance and Sexual Activity  Alcohol Use No  . Alcohol/week: 0.0 standard drinks    Family History:  Family History  Problem Relation Age of Onset  . Hypertension Mother   . Cirrhosis Father   . Gout Son   . Heart disease Maternal Uncle 36  . Heart attack Maternal Uncle   . Cancer Paternal Uncle   . Diabetes Paternal Grandmother     Past medical history, surgical history, medications, allergies, family history and social history reviewed with patient today and changes made to appropriate areas of the chart.   Review of Systems  Constitutional: Negative.   HENT: Negative.   Eyes: Negative.   Respiratory: Negative.   Cardiovascular: Negative.   Gastrointestinal:  Negative.   Genitourinary: Negative.   Musculoskeletal: Positive for back pain and myalgias. Negative for falls, joint pain and neck pain.  Skin: Negative.   Neurological: Positive for tingling. Negative for dizziness, tremors, sensory change, speech change, focal weakness, seizures, loss of consciousness, weakness and headaches.  Endo/Heme/Allergies: Negative.   Psychiatric/Behavioral: Negative.     All other ROS negative except what is listed above and in the HPI.      Objective:  BP 138/72 (BP Location: Left Arm, Cuff Size: Normal)   Pulse 85   Temp 98.1 F (36.7 C)   Ht 5\' 11"  (1.803 m)   Wt 216 lb (98 kg)   SpO2 98%   BMI 30.13 kg/m   Wt Readings from Last 3 Encounters:  09/03/19 216 lb (98 kg)  09/03/19 216 lb (98 kg)  08/16/19 215 lb (97.5 kg)    Physical Exam Vitals and nursing note reviewed.  Constitutional:      General: He is not in acute distress.    Appearance: Normal appearance. He is obese. He is not ill-appearing, toxic-appearing or diaphoretic.  HENT:     Head: Normocephalic and atraumatic.     Right Ear: Tympanic membrane, ear canal and external ear normal. There is no impacted cerumen.     Left Ear: Tympanic membrane, ear canal and external ear normal. There is no impacted cerumen.     Nose: Nose normal. No congestion or rhinorrhea.     Mouth/Throat:     Mouth: Mucous membranes are moist.     Pharynx: Oropharynx is clear. No oropharyngeal exudate or posterior oropharyngeal erythema.  Eyes:     General: No scleral icterus.       Right eye: No discharge.        Left eye: No discharge.     Extraocular Movements: Extraocular movements intact.     Conjunctiva/sclera: Conjunctivae normal.     Pupils: Pupils are equal, round, and reactive to light.  Neck:     Vascular: No carotid bruit.  Cardiovascular:     Rate and Rhythm: Normal rate and regular rhythm.     Pulses: Normal pulses.     Heart sounds: No murmur. No friction rub. No gallop.     Pulmonary:     Effort: Pulmonary effort is normal. No respiratory distress.     Breath sounds: Normal breath sounds. No stridor. No wheezing, rhonchi or rales.  Chest:     Chest wall: No tenderness.  Abdominal:     General: Abdomen is flat. Bowel sounds are normal. There is no distension.     Palpations: Abdomen is soft. There is no mass.     Tenderness: There is no abdominal tenderness. There is no right CVA tenderness, left CVA tenderness, guarding or rebound.     Hernia: No hernia is present.  Genitourinary:    Comments: Genital exam deferred with shared decision making Musculoskeletal:        General: No swelling, tenderness, deformity or signs of injury.     Cervical back: Normal range of motion and neck supple. No rigidity. No muscular tenderness.     Right lower leg: No edema.     Left lower leg: No edema.  Lymphadenopathy:     Cervical: No cervical adenopathy.  Skin:    General: Skin is warm and dry.     Capillary Refill: Capillary refill takes less than 2 seconds.     Coloration: Skin is not jaundiced or pale.     Findings: No bruising, erythema, lesion or rash.  Neurological:     General: No focal deficit present.     Mental Status: He is alert and oriented to person, place, and time.     Cranial Nerves: No cranial nerve deficit.     Sensory: No sensory deficit.     Motor: No weakness.     Coordination: Coordination normal.     Gait: Gait normal.     Deep Tendon Reflexes:  Reflexes normal.  Psychiatric:        Mood and Affect: Mood normal.        Behavior: Behavior normal.        Thought Content: Thought content normal.        Judgment: Judgment normal.     Results for orders placed or performed in visit on [id number]  Basic metabolic panel  Result Value Ref Range   Glucose 106 (H) 65 - 99 mg/dL   BUN 10 8 - 27 mg/dL   Creatinine, Ser 1.14 0.76 - 1.27 mg/dL   GFR calc non Af Amer 64 >59 mL/min/1.73   GFR calc Af Amer 74 >59 mL/min/1.73   BUN/Creatinine Ratio  9 (L) 10 - 24   Sodium 136 134 - 144 mmol/L   Potassium 4.4 3.5 - 5.2 mmol/L   Chloride 100 96 - 106 mmol/L   CO2 21 20 - 29 mmol/L   Calcium 9.2 8.6 - 10.2 mg/dL      Assessment & Plan:   Problem List Items Addressed This Visit      Cardiovascular and Mediastinum   HTN (hypertension)    Under good control on recheck. Continue current regimen. Continue to monitor. Call with any concerns.       Relevant Medications   sildenafil (REVATIO) 20 MG tablet   benazepril (LOTENSIN) 40 MG tablet   aspirin 81 MG EC tablet   amLODipine (NORVASC) 10 MG tablet   Other Relevant Orders   CBC with Differential/Platelet   Comprehensive metabolic panel   Microalbumin, Urine Waived   TSH   CAD (coronary artery disease)    Will keep BP and cholesterol under good control. Rechecking levels today. Continue to monitor.       Relevant Medications   sildenafil (REVATIO) 20 MG tablet   benazepril (LOTENSIN) 40 MG tablet   aspirin 81 MG EC tablet   amLODipine (NORVASC) 10 MG tablet   Other Relevant Orders   CBC with Differential/Platelet   Comprehensive metabolic panel   Lipid Panel w/o Chol/HDL Ratio   TSH   Atherosclerosis of aorta (HCC)    Will keep BP and cholesterol under good control. Rechecking levels today. Continue to monitor.       Relevant Medications   sildenafil (REVATIO) 20 MG tablet   benazepril (LOTENSIN) 40 MG tablet   aspirin 81 MG EC tablet   amLODipine (NORVASC) 10 MG tablet   Other Relevant Orders   CBC with Differential/Platelet   Comprehensive metabolic panel   Lipid Panel w/o Chol/HDL Ratio   TSH     Respiratory   COPD (chronic obstructive pulmonary disease) (HCC)    Stable off medicine. Continue to monitor. Call with any concerns.       Relevant Orders   CBC with Differential/Platelet   Comprehensive metabolic panel   TSH     Digestive   Hep C w/o coma, chronic ([place])    S/p harvoni. No concerns. Checking labs today. Continue to monitor.        Relevant Orders   CBC with Differential/Platelet   Comprehensive metabolic panel   TSH   HCV RNA quant     Other   Tobacco abuse    Advised to quit smoking. Continue to monitor. Call with any concerns.       Relevant Orders   CBC with Differential/Platelet   Comprehensive metabolic panel   TSH   UA/M w/rflx Culture, Routine   ED (erectile dysfunction)    Under good control  on current regimen. Continue current regimen. Continue to monitor. Call with any concerns. Refills given.        Relevant Medications   sildenafil (REVATIO) 20 MG tablet   Acute left-sided low back pain with left-sided sciatica    Has been having numbness and tingling. Just started PT last week. Given radiculopathy, will check x-ray. Await results. Continue PT. Call with any concerns.       Relevant Medications   aspirin 81 MG EC tablet   Other Relevant Orders   DG Lumbar Spine Complete    Other Visit Diagnoses    Routine general medical examination at a health care facility    -  Primary   Vaccines up to date. Screening labs checked today. Colonoscopy up to date. Continue diet and exercise. Call with any concerns.    Screening for malignant neoplasm of prostate       Labs drawn today. Await results.    Relevant Orders   PSA       Discussed aspirin prophylaxis for myocardial infarction prevention and decision was made to continue ASA  LABORATORY TESTING:  Health maintenance labs ordered today as discussed above.   The natural history of prostate cancer and ongoing controversy regarding screening and potential treatment outcomes of prostate cancer has been discussed with the patient. The meaning of a false positive PSA and a false negative PSA has been discussed. He indicates understanding of the limitations of this screening test and wishes to proceed with screening PSA testing.   IMMUNIZATIONS:   - Tdap: Tetanus vaccination status reviewed: last tetanus booster within 10 years. - Influenza: Up  to date - Pneumovax: Up to date - Prevnar: Up to date  SCREENING: - Colonoscopy: Up to date  Discussed with patient purpose of the colonoscopy is to detect colon cancer at curable precancerous or early stages   PATIENT COUNSELING:    Sexuality: Discussed sexually transmitted diseases, partner selection, use of condoms, avoidance of unintended pregnancy  and contraceptive alternatives.   Advised to avoid cigarette smoking.  I discussed with the patient that most people either abstain from alcohol or drink within safe limits (<=14/week and <=4 drinks/occasion for males, <=7/weeks and <= 3 drinks/occasion for females) and that the risk for alcohol disorders and other health effects rises proportionally with the number of drinks per week and how often a drinker exceeds daily limits.  Discussed cessation/primary prevention of drug use and availability of treatment for abuse.   Diet: Encouraged to adjust caloric intake to maintain  or achieve ideal body weight, to reduce intake of dietary saturated fat and total fat, to limit sodium intake by avoiding high sodium foods and not adding table salt, and to maintain adequate dietary potassium and calcium preferably from fresh fruits, vegetables, and low-fat dairy products.    stressed the importance of regular exercise  Injury prevention: Discussed safety belts, safety helmets, smoke detector, smoking near bedding or upholstery.   Dental health: Discussed importance of regular tooth brushing, flossing, and dental visits.   Follow up plan: NEXT PREVENTATIVE PHYSICAL DUE IN 1 YEAR. Return in about 6 months (around 03/02/2020).

## 2019-09-03 NOTE — Assessment & Plan Note (Signed)
Will keep BP and cholesterol under good control. Rechecking levels today. Continue to monitor.

## 2019-09-03 NOTE — Progress Notes (Signed)
Subjective:   CAYDYN FIESER is a 74 y.o. male who presents for Medicare Annual/Subsequent preventive examination.    Review of Systems:   Cardiac Risk Factors include: advanced age (>65men, >68 women);male gender;dyslipidemia;hypertension     Objective:    Vitals: BP (!) 154/81 (BP Location: Left Arm, Patient Position: Sitting, Cuff Size: Normal)   Pulse 85   Temp 98.1 F (36.7 C) (Oral)   Resp 16   Ht 5\' 11"  (1.803 m)   Wt 216 lb (98 kg)   SpO2 98%   BMI 30.13 kg/m   Body mass index is 30.13 kg/m.  Advanced Directives 08/28/2018 09/21/2017 08/24/2017 08/17/2016 07/17/2015  Does Patient Have a Medical Advance Directive? Yes Yes Yes Yes Yes  Type of Advance Directive Living will;Healthcare Power of [place];Living will [place];Living will [place];Living will Living will;Healthcare Power of Attorney  Does patient want to make changes to medical advance directive? - - - Yes (MAU/Ambulatory/Procedural Areas - Information given) No - Patient declined  Copy of [place] in Chart? No - copy requested No - copy requested No - copy requested No - copy requested No - copy requested    Tobacco Social History   Tobacco Use  Smoking Status Current Every Day Smoker  . Packs/day: 1.00  . Years: 47.50  . Pack years: 47.50  . Types: Cigarettes  Smokeless Tobacco Never Used  Tobacco Comment   has had 5 since jan 2019     Ready to quit: Yes Counseling given: Yes Comment: has had 5 since jan 2019   Clinical Intake:  Pre-visit preparation completed: Yes  Pain : No/denies pain Pain Score: 0-No pain     Nutritional Risks: None Diabetes: No  How often do you need to have someone help you when you read instructions, pamphlets, or other written materials from your doctor or pharmacy?: 1 - Never  Interpreter Needed?: No  Information entered by :: Adhira Jamil,LPN  Past Medical  History:  Diagnosis Date  . COPD (chronic obstructive pulmonary disease) ([place])   . Coronary artery disease   . Hepatitis C   . Hernia, inguinal 2014  . Hypertension 2009  . IFG (impaired fasting glucose) 07/22/2015  . Personal history of tobacco use, presenting hazards to health 08/26/2015  . Sleep apnea    Past Surgical History:  Procedure Laterality Date  . COLONOSCOPY  2010  . COLONOSCOPY WITH PROPOFOL N/A 09/21/2017   Procedure: COLONOSCOPY WITH PROPOFOL;  Surgeon: Virgel Manifold, MD;  Location: [facility] ENDOSCOPY;  Service: Endoscopy;  Laterality: N/A;  . HERNIA REPAIR  2014   Family History  Problem Relation Age of Onset  . Hypertension Mother   . Cirrhosis Father   . Gout Son   . Heart disease Maternal Uncle 38  . Heart attack Maternal Uncle   . Cancer Paternal Uncle   . Diabetes Paternal Grandmother    Social History   Socioeconomic History  . Marital status: Divorced    Spouse name: Not on file  . Number of children: Not on file  . Years of education: Not on file  . Highest education level: Associate degree: academic program  Occupational History  . Not on file  Tobacco Use  . Smoking status: Current Every Day Smoker    Packs/day: 1.00    Years: 47.50    Pack years: 47.50    Types: Cigarettes  . Smokeless tobacco: Never Used  . Tobacco  comment: has had 5 since jan 2019  Substance and Sexual Activity  . Alcohol use: No    Alcohol/week: 0.0 standard drinks  . Drug use: No  . Sexual activity: Not on file  Other Topics Concern  . 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 Difficulty of Paying Living Expenses: Not on file  Food Insecurity:   . Worried About Charity fundraiser in the Last Year: Not on file  . Ran Out of Food in the Last Year: Not on file  Transportation Needs:   . Lack of  Transportation (Medical): Not on file  . Lack of Transportation (Non-Medical): Not on file  Physical Activity:   . Days of Exercise per Week: Not on file  . Minutes of Exercise per Session: Not on file  Stress:   . Feeling of Stress : Not on file  Social Connections:   . Frequency of Communication with Friends and Family: Not on file  . Frequency of Social Gatherings with Friends and Family: Not on file  . Attends Religious Services: Not on file  . Active Member of Clubs or Organizations: Not on file  . Attends Archivist Meetings: Not on file  . Marital Status: Not on file    Outpatient Encounter Medications as of 09/03/2019  Medication Sig  . amLODipine (NORVASC) 10 MG tablet Take 1 tablet (10 mg total) by mouth daily.  Marland Kitchen aspirin 81 MG EC tablet Take 1 tablet (81 mg total) by mouth daily.  . benazepril (LOTENSIN) 40 MG tablet Take 1 tablet (40 mg total) by mouth daily.  . cyclobenzaprine (FLEXERIL) 10 MG tablet Take 1 tablet (10 mg total) by mouth at bedtime.  . naproxen (NAPROSYN) 500 MG tablet Take 1 tablet (500 mg total) by mouth 2 (two) times daily with a meal.  . sildenafil (REVATIO) 20 MG tablet TAKE 1-5 TABS 30 MINUTES PRIOR TO INTERCOURSE  . triamcinolone ointment (KENALOG) 0.5 % APPLY TO AFFECTED AREA TWICE A DAY  . valACYclovir (VALTREX) 500 MG tablet Take 1 tablet (500 mg total) by mouth 2 (two) times daily as needed. (Patient not taking: Reported on 09/03/2019)  . [DISCONTINUED] predniSONE (DELTASONE) 50 MG tablet Take 1 tablet (50 mg total) by mouth daily with breakfast. (Patient not taking: Reported on 09/03/2019)   No facility-administered encounter medications on file as of 09/03/2019.    Activities of Daily Living In your present state of health, do you have any difficulty performing the following activities: 09/03/2019  Hearing? N  Comment no hearing aids  Vision? N  Comment no eye dr  Difficulty concentrating or making decisions? N  Walking or climbing  stairs? Y  Comment some, mostly in afternoon  Dressing or bathing? N  Doing errands, shopping? N  Preparing Food and eating ? N  Using the Toilet? N  In the past six months, have you accidently leaked urine? N  Do you have problems with loss of bowel control? N  Managing your Medications? N  Managing your Finances? N  Housekeeping or managing your Housekeeping? N  Some recent data might be hidden    Patient Care Team: Valerie Roys, DO as PCP - General (Family Medicine)   Assessment:   This is a routine wellness examination  for [employer].  Exercise Activities and Dietary recommendations Current Exercise Habits: Home exercise routine, Type of exercise: strength training/weights;stretching, Time (Minutes): 30(PT exercises), Frequency (Times/Week): 5, Weekly Exercise (Minutes/Week): 150, Intensity: Mild, Exercise limited by: None identified  Goals    . Quit Smoking     Smoking cessation discussed       Fall Risk: Fall Risk  09/03/2019 08/08/2019 08/28/2018 08/24/2017 08/17/2016  Falls in the past year? 0 0 0 No No  Number falls in past yr: 0 0 0 - -  Injury with Fall? 0 0 - - -    FALL RISK PREVENTION PERTAINING TO THE HOME:  Any stairs in or around the home? Yes  If so, are there any without handrails? No   Home free of loose throw rugs in walkways, pet beds, electrical cords, etc? Yes  Adequate lighting in your home to reduce risk of falls? Yes   ASSISTIVE DEVICES UTILIZED TO PREVENT FALLS:  Life alert? No  Use of a cane, walker or w/c? Yes  cane as needed  Grab bars in the bathroom? No  Shower chair or bench in shower? No  Elevated toilet seat or a handicapped toilet? No   TIMED UP AND GO:  Was the test performed? Yes .  Length of time to ambulate 10 feet: 8 sec.   GAIT:  Appearance of gait: Gait steady and fast without the use of an assistive device.  Education: Fall risk prevention has been discussed.  Intervention(s) required? No  DME/home health order  needed?  No   Depression Screen PHQ 2/9 Scores 08/28/2018 08/24/2017 08/17/2016 07/20/2015  PHQ - 2 Score 0 0 0 0  PHQ- 9 Score - - - 2    Cognitive Function     6CIT Screen 09/03/2019 08/28/2018 08/24/2017 08/17/2016  What Year? 0 points 0 points 0 points 0 points  What month? 0 points 0 points 0 points 0 points  What time? 0 points 0 points 0 points 0 points  Count back from 20 0 points 0 points 0 points 0 points  Months in reverse 0 points 0 points 0 points 0 points  Repeat phrase 0 points 0 points 0 points 2 points  Total Score 0 0 0 2    Immunization History  Administered Date(s) Administered  . Fluad Quad(high Dose 65+) 04/30/2019  . Hepatitis A, Adult 08/14/2015, 02/11/2016  . Hepatitis B, adult 07/17/2015, 08/14/2015, 02/11/2016  . Influenza, High Dose Seasonal PF 05/07/2016, 08/28/2018  . Influenza,inj,Quad PF,6+ Mos 07/17/2015  . Influenza-Unspecified 05/16/2014, 05/24/2017  . Moderna SARS-COVID-2 Vaccination 07/19/2019, 08/17/2019  . Pneumococcal Conjugate-13 05/16/2014  . Pneumococcal Polysaccharide-23 07/17/2015  . Td 01/23/2003, 07/17/2015    Qualifies for Shingles Vaccine? Yes  Zostavax completed n/a. Due for Shingrix. Education has been provided regarding the importance of this vaccine. Pt has been advised to call insurance company to determine out of pocket expense. Advised may also receive vaccine at local pharmacy or Health Dept. Verbalized acceptance and understanding.  Tdap: up to date   Flu Vaccine: up to date   Pneumococcal Vaccine: up to date .   Screening Tests Health Maintenance  Topic Date Due  . COLONOSCOPY  09/21/2020  . TETANUS/TDAP  07/16/2025  . INFLUENZA VACCINE  Completed  . Hepatitis C Screening  Completed  . PNA vac Low Risk Adult  Completed   Cancer Screenings:  Colorectal Screening: Completed 09/21/2017. Repeat every 3-5 years.   Lung Cancer Screening: (Low Dose CT Chest recommended if Age 59-80 years,  30 pack-year currently smoking OR  have quit w/in 15years.) does not qualify.     Additional Screening:  Hepatitis C Screening: does qualify; Completed 08/29/2019  Vision Screening: Recommended annual ophthalmology exams for early detection of glaucoma and other disorders of the eye. Is the patient up to date with their annual eye exam?  no    Dental Screening: Recommended annual dental exams for proper oral hygiene  Community Resource Referral:  CRR required this visit?  No        Plan:  I have personally reviewed and addressed the Medicare Annual Wellness questionnaire and have noted the following in the patient's chart:  A. Medical and social history B. Use of alcohol, tobacco or illicit drugs  C. Current medications and supplements D. Functional ability and status E.  Nutritional status F.  Physical activity G. Advance directives H. List of other physicians I.  Hospitalizations, surgeries, and ER visits in previous 12 months J.  [place] such as hearing and vision if needed, cognitive and depression L. Referrals and appointments   In addition, I have reviewed and discussed with patient certain preventive protocols, quality metrics, and best practice recommendations. A written personalized care plan for preventive services as well as general preventive health recommendations were provided to patient.   Signed,   Bevelyn Ngo, LPN  [id number] Nurse Health Advisor   Nurse Notes: none

## 2019-09-03 NOTE — Assessment & Plan Note (Signed)
Advised to quit smoking. Continue to monitor. Call with any concerns.

## 2019-09-03 NOTE — Assessment & Plan Note (Signed)
Stable off medicine. Continue to monitor. Call with any concerns.

## 2019-09-03 NOTE — Assessment & Plan Note (Signed)
S/p harvoni. No concerns. Checking labs today. Continue to monitor.

## 2019-09-03 NOTE — Assessment & Plan Note (Signed)
Has been having numbness and tingling. Just started PT last week. Given radiculopathy, will check x-ray. Await results. Continue PT. Call with any concerns.

## 2019-09-03 NOTE — Assessment & Plan Note (Signed)
Under good control on recheck. Continue current regimen. Continue to monitor. Call with any concerns.  

## 2019-09-03 NOTE — Assessment & Plan Note (Signed)
Under good control on current regimen. Continue current regimen. Continue to monitor. Call with any concerns. Refills given.   

## 2019-09-03 NOTE — Patient Instructions (Signed)
Nathan Cooper , Thank you for taking time to come for your Medicare Wellness Visit. I appreciate your ongoing commitment to your health goals. Please review the following plan we discussed and let me know if I can assist you in the future.   Screening recommendations/referrals: Colonoscopy: completed 09/2017 Recommended yearly ophthalmology/optometry visit for glaucoma screening and checkup Recommended yearly dental visit for hygiene and checkup  Vaccinations: Influenza vaccine: up to date Pneumococcal vaccine: up to date  Tdap vaccine: up to date Shingles vaccine: shingrix eligible     Advanced directives: Please bring a copy of your health care power of attorney and living will to the office at your convenience.  Conditions/risks identified: none   Next appointment: Follow up in one year for your annual wellness visit   Preventive Care 65 Years and Older, Male Preventive care refers to lifestyle choices and visits with your health care provider that can promote health and wellness. What does preventive care include?  A yearly physical exam. This is also called an annual well check.  Dental exams once or twice a year.  Routine eye exams. Ask your health care provider how often you should have your eyes checked.  Personal lifestyle choices, including:  Daily care of your teeth and gums.  Regular physical activity.  Eating a healthy diet.  Avoiding tobacco and drug use.  Limiting alcohol use.  Practicing safe sex.  Taking low doses of aspirin every day.  Taking vitamin and mineral supplements as recommended by your health care provider. What happens during an annual well check? The services and screenings done by your health care provider during your annual well check will depend on your age, overall health, lifestyle risk factors, and family history of disease. Counseling  Your health care provider may ask you questions about your:  Alcohol use.  Tobacco  use.  Drug use.  Emotional well-being.  Home and relationship well-being.  Sexual activity.  Eating habits.  History of falls.  Memory and ability to understand (cognition).  Work and work Statistician. Screening  You may have the following tests or measurements:  Height, weight, and BMI.  Blood pressure.  Lipid and cholesterol levels. These may be checked every 5 years, or more frequently if you are over 8 years old.  Skin check.  Lung cancer screening. You may have this screening every year starting at age 85 if you have a 30-pack-year history of smoking and currently smoke or have quit within the past 15 years.  Fecal occult blood test (FOBT) of the stool. You may have this test every year starting at age 25.  Flexible sigmoidoscopy or colonoscopy. You may have a sigmoidoscopy every 5 years or a colonoscopy every 10 years starting at age 79.  Prostate cancer screening. Recommendations will vary depending on your family history and other risks.  Hepatitis C blood test.  Hepatitis B blood test.  Sexually transmitted disease (STD) testing.  Diabetes screening. This is done by checking your blood sugar (glucose) after you have not eaten for a while (fasting). You may have this done every 1-3 years.  Abdominal aortic aneurysm (AAA) screening. You may need this if you are a current or former smoker.  Osteoporosis. You may be screened starting at age 56 if you are at high risk. Talk with your health care provider about your test results, treatment options, and if necessary, the need for more tests. Vaccines  Your health care provider may recommend certain vaccines, such as:  Influenza vaccine. This  is recommended every year.  Tetanus, diphtheria, and acellular pertussis (Tdap, Td) vaccine. You may need a Td booster every 10 years.  Zoster vaccine. You may need this after age 32.  Pneumococcal 13-valent conjugate (PCV13) vaccine. One dose is recommended after age  58.  Pneumococcal polysaccharide (PPSV23) vaccine. One dose is recommended after age 15. Talk to your health care provider about which screenings and vaccines you need and how often you need them. This information is not intended to replace advice given to you by your health care provider. Make sure you discuss any questions you have with your health care provider. Document Released: 08/01/2015 Document Revised: 03/24/2016 Document Reviewed: 05/06/2015 Elsevier Interactive Patient Education  2017 [place] Prevention in the Home Falls can cause injuries. They can happen to people of all ages. There are many things you can do to make your home safe and to help prevent falls. What can I do on the outside of my home?  Regularly fix the edges of walkways and driveways and fix any cracks.  Remove anything that might make you trip as you walk through a door, such as a raised step or threshold.  Trim any bushes or trees on the path to your home.  Use bright outdoor lighting.  Clear any walking paths of anything that might make someone trip, such as rocks or tools.  Regularly check to see if handrails are loose or broken. Make sure that both sides of any steps have handrails.  Any raised decks and porches should have guardrails on the edges.  Have any leaves, snow, or ice cleared regularly.  Use sand or salt on walking paths during winter.  Clean up any spills in your garage right away. This includes oil or grease spills. What can I do in the bathroom?  Use night lights.  Install grab bars by the toilet and in the tub and shower. Do not use towel bars as grab bars.  Use non-skid mats or decals in the tub or shower.  If you need to sit down in the shower, use a plastic, non-slip stool.  Keep the floor dry. Clean up any water that spills on the floor as soon as it happens.  Remove soap buildup in the tub or shower regularly.  Attach bath mats securely with double-sided  non-slip rug tape.  Do not have throw rugs and other things on the floor that can make you trip. What can I do in the bedroom?  Use night lights.  Make sure that you have a light by your bed that is easy to reach.  Do not use any sheets or blankets that are too big for your bed. They should not hang down onto the floor.  Have a firm chair that has side arms. You can use this for support while you get dressed.  Do not have throw rugs and other things on the floor that can make you trip. What can I do in the kitchen?  Clean up any spills right away.  Avoid walking on wet floors.  Keep items that you use a lot in easy-to-reach places.  If you need to reach something above you, use a strong step stool that has a grab bar.  Keep electrical cords out of the way.  Do not use floor polish or wax that makes floors slippery. If you must use wax, use non-skid floor wax.  Do not have throw rugs and other things on the floor that can make you  trip. What can I do with my stairs?  Do not leave any items on the stairs.  Make sure that there are handrails on both sides of the stairs and use them. Fix handrails that are broken or loose. Make sure that handrails are as long as the stairways.  Check any carpeting to make sure that it is firmly attached to the stairs. Fix any carpet that is loose or worn.  Avoid having throw rugs at the top or bottom of the stairs. If you do have throw rugs, attach them to the floor with carpet tape.  Make sure that you have a light switch at the top of the stairs and the bottom of the stairs. If you do not have them, ask someone to add them for you. What else can I do to help prevent falls?  Wear shoes that:  Do not have high heels.  Have rubber bottoms.  Are comfortable and fit you well.  Are closed at the toe. Do not wear sandals.  If you use a stepladder:  Make sure that it is fully opened. Do not climb a closed stepladder.  Make sure that both  sides of the stepladder are locked into place.  Ask someone to hold it for you, if possible.  Clearly mark and make sure that you can see:  Any grab bars or handrails.  First and last steps.  Where the edge of each step is.  Use tools that help you move around (mobility aids) if they are needed. These include:  Canes.  Walkers.  Scooters.  Crutches.  Turn on the lights when you go into a dark area. Replace any light bulbs as soon as they burn out.  Set up your furniture so you have a clear path. Avoid moving your furniture around.  If any of your floors are uneven, fix them.  If there are any pets around you, be aware of where they are.  Review your medicines with your doctor. Some medicines can make you feel dizzy. This can increase your chance of falling. Ask your doctor what other things that you can do to help prevent falls. This information is not intended to replace advice given to you by your health care provider. Make sure you discuss any questions you have with your health care provider. Document Released: 05/01/2009 Document Revised: 12/11/2015 Document Reviewed: 08/09/2014 Elsevier Interactive Patient Education  2017 [employer].

## 2019-09-05 LAB — LIPID PANEL W/O CHOL/HDL RATIO
Cholesterol, Total: 185 mg/dL (ref 100–199)
HDL: 49 mg/dL (ref >39)
LDL Chol Calc (NIH): 119 mg/dL — ABNORMAL HIGH (ref 0–99)
Triglycerides: 96 mg/dL (ref 0–149)
VLDL Cholesterol Cal: 17 mg/dL (ref 5–40)

## 2019-09-05 LAB — COMPREHENSIVE METABOLIC PANEL
ALT: 13 IU/L (ref 0–44)
AST: 17 IU/L (ref 0–40)
Albumin/Globulin Ratio: 1.7 (ref 1.2–2.2)
Albumin: 5 g/dL — ABNORMAL HIGH (ref 3.7–4.7)
Alkaline Phosphatase: 75 IU/L (ref 39–117)
BUN/Creatinine Ratio: 10 (ref 10–24)
BUN: 11 mg/dL (ref 8–27)
Bilirubin Total: 0.4 mg/dL (ref 0.0–1.2)
CO2: 22 mmol/L (ref 20–29)
Calcium: 9.4 mg/dL (ref 8.6–10.2)
Chloride: 99 mmol/L (ref 96–106)
Creatinine, Ser: 1.12 mg/dL (ref 0.76–1.27)
GFR calc Af Amer: 76 mL/min/{1.73_m2} (ref >59)
GFR calc non Af Amer: 66 mL/min/{1.73_m2} (ref >59)
Globulin, Total: 3 g/dL (ref 1.5–4.5)
Glucose: 84 mg/dL (ref 65–99)
Potassium: 4.4 mmol/L (ref 3.5–5.2)
Sodium: 136 mmol/L (ref 134–144)
Total Protein: 8 g/dL (ref 6.0–8.5)

## 2019-09-05 LAB — CBC WITH DIFFERENTIAL/PLATELET
Basophils Absolute: 0.1 10*3/uL (ref 0.0–0.2)
Basos: 1 %
EOS (ABSOLUTE): 0.2 10*3/uL (ref 0.0–0.4)
Eos: 2 %
Hematocrit: 42.9 % (ref 37.5–51.0)
Hemoglobin: 15 g/dL (ref 13.0–17.7)
Immature Grans (Abs): 0 10*3/uL (ref 0.0–0.1)
Immature Granulocytes: 0 %
Lymphocytes Absolute: 1.6 10*3/uL (ref 0.7–3.1)
Lymphs: 16 %
MCH: 31.1 pg (ref 26.6–33.0)
MCHC: 35 g/dL (ref 31.5–35.7)
MCV: 89 fL (ref 79–97)
Monocytes Absolute: 1 10*3/uL — ABNORMAL HIGH (ref 0.1–0.9)
Monocytes: 10 %
Neutrophils Absolute: 7.2 10*3/uL — ABNORMAL HIGH (ref 1.4–7.0)
Neutrophils: 71 %
Platelets: 306 10*3/uL (ref 150–450)
RBC: 4.83 x10E6/uL (ref 4.14–5.80)
RDW: 13.2 % (ref 11.6–15.4)
WBC: 10 10*3/uL (ref 3.4–10.8)

## 2019-09-05 LAB — TSH: TSH: 0.865 u[IU]/mL (ref 0.450–4.500)

## 2019-09-05 LAB — HCV RNA QUANT: Hepatitis C Quantitation: NOT DETECTED IU/mL

## 2019-09-05 LAB — PSA: Prostate Specific Ag, Serum: 1.1 ng/mL (ref 0.0–4.0)

## 2019-09-07 ENCOUNTER — Ambulatory Visit: Payer: Medicare Other | Admitting: Physical Therapy

## 2019-09-30 ENCOUNTER — Other Ambulatory Visit: Payer: Self-pay | Admitting: Family Medicine

## 2019-09-30 NOTE — Telephone Encounter (Signed)
Requested medications are due for refill today? Yes - Medication cannot be delegated.    Requested medications are on active medication list?  Yes  Last Refill:   08/08/2019 # 30 with 1 refill.    Future visit scheduled?  Yes  Notes to Clinic:  Medication cannot be delegated.

## 2019-10-01 NOTE — Telephone Encounter (Signed)
Patient last seen 09/03/19

## 2019-10-13 ENCOUNTER — Telehealth: Payer: Self-pay | Admitting: Family Medicine

## 2019-10-13 NOTE — Telephone Encounter (Signed)
Copied from [place] [phone]. Topic: General - Other >> Oct 12, 2019  9:54 AM Rainey Pines A wrote: [facility] called in regards to patient having cardiovascular disease and the patient isnt showing record of being on a  statin according cholesterol guideline recommendations. They are asking that doctor reevaluate and prescribe one for patient if necessary. Please advise Best contact [phone]

## 2020-01-13 ENCOUNTER — Other Ambulatory Visit: Payer: Self-pay | Admitting: Family Medicine

## 2020-01-13 NOTE — Telephone Encounter (Signed)
Requested Prescriptions  Pending Prescriptions Disp Refills  . naproxen (NAPROSYN) 500 MG tablet [Pharmacy Med Name: NAPROXEN 500 MG TABLET] 60 tablet 3    Sig: TAKE 1 TABLET (500 MG TOTAL) BY MOUTH 2 (TWO) TIMES DAILY WITH A MEAL.     Analgesics:  NSAIDS Passed - 01/13/2020 12:56 AM      Passed - Cr in normal range and within 360 days    Creatinine, Ser  Date Value Ref Range Status  09/03/2019 1.12 0.76 - 1.27 mg/dL Final         Passed - HGB in normal range and within 360 days    Hemoglobin  Date Value Ref Range Status  09/03/2019 15.0 13.0 - 17.7 g/dL Final         Passed - Patient is not pregnant      Passed - Valid encounter within last 12 months    Recent Outpatient Visits          4 months ago Routine general medical examination at a health care facility   [facility], [place] P, DO   5 months ago Acute left-sided low back pain with left-sided sciatica   [place], NP   5 months ago Acute bilateral low back pain with left-sided sciatica   [facility], [name], DO   10 months ago Essential hypertension   [place], [place], DO   1 year ago Routine general medical examination at a health care facility   [place], Barb Merino, DO      Future Appointments            In 1 month Wynetta Emery, Barb Merino, DO [facility], [place]

## 2020-03-06 ENCOUNTER — Ambulatory Visit (INDEPENDENT_AMBULATORY_CARE_PROVIDER_SITE_OTHER): Payer: Medicare Other | Admitting: Family Medicine

## 2020-03-06 ENCOUNTER — Other Ambulatory Visit: Payer: Self-pay | Admitting: Family Medicine

## 2020-03-06 ENCOUNTER — Other Ambulatory Visit: Payer: Self-pay

## 2020-03-06 ENCOUNTER — Encounter: Payer: Self-pay | Admitting: Family Medicine

## 2020-03-06 VITALS — BP 118/52 | HR 89 | Temp 98.5°F | Wt 219.8 lb

## 2020-03-06 DIAGNOSIS — N529 Male erectile dysfunction, unspecified: Secondary | ICD-10-CM

## 2020-03-06 DIAGNOSIS — J449 Chronic obstructive pulmonary disease, unspecified: Secondary | ICD-10-CM | POA: Diagnosis not present

## 2020-03-06 DIAGNOSIS — Z13220 Encounter for screening for lipoid disorders: Secondary | ICD-10-CM

## 2020-03-06 DIAGNOSIS — I10 Essential (primary) hypertension: Secondary | ICD-10-CM | POA: Diagnosis not present

## 2020-03-06 MED ORDER — AMLODIPINE BESYLATE 10 MG PO TABS: 10.0000 mg | ORAL_TABLET | Freq: Every day | ORAL | 1 refills | Status: DC

## 2020-03-06 MED ORDER — BENAZEPRIL HCL 40 MG PO TABS: 40.0000 mg | ORAL_TABLET | Freq: Every day | ORAL | 1 refills | Status: DC

## 2020-03-06 MED ORDER — SILDENAFIL CITRATE 20 MG PO TABS: ORAL_TABLET | ORAL | 4 refills | Status: DC

## 2020-03-06 MED ORDER — VALACYCLOVIR HCL 500 MG PO TABS: 500.0000 mg | ORAL_TABLET | Freq: Two times a day (BID) | ORAL | 5 refills | Status: DC | PRN

## 2020-03-06 MED ORDER — ANORO ELLIPTA 62.5-25 MCG/INH IN AEPB: 1.0000 | INHALATION_SPRAY | Freq: Every day | RESPIRATORY_TRACT | 3 refills | Status: DC

## 2020-03-06 MED ORDER — NAPROXEN 500 MG PO TABS: 500.0000 mg | ORAL_TABLET | Freq: Two times a day (BID) | ORAL | 3 refills | Status: DC

## 2020-03-06 NOTE — Assessment & Plan Note (Signed)
Under good control on current regimen. Continue current regimen. Continue to monitor. Call with any concerns. Refills given. Labs drawn today.   

## 2020-03-06 NOTE — Progress Notes (Signed)
BP (!) 118/52 (BP Location: Left Arm, Cuff Size: Large)   Pulse 89   Temp 98.5 F (36.9 C) (Oral)   Wt 219 lb 12.8 oz (99.7 kg)   SpO2 97%   BMI 30.66 kg/m    Subjective:    Patient ID: Nathan Cooper, male    DOB: 06/01/1946, 74 y.o.   MRN: [id number]  HPI: Nathan Cooper is a 74 y.o. male  Chief Complaint  Patient presents with  . Hyperlipidemia  . Hypertension   HYPERTENSION / [place] Satisfied with current treatment? yes Duration of hypertension: months BP monitoring frequency: not checking BP medication side effects: no Past BP meds: benazepril, amlodipine Duration of hyperlipidemia: chronic Cholesterol medication side effects: no Cholesterol supplements: none Past cholesterol medications: none Medication compliance: excellent compliance Aspirin: no Recent stressors: no Recurrent headaches: no Visual changes: no Palpitations: no Dyspnea: no Chest pain: no Lower extremity edema: no Dizzy/lightheaded: no  COPD COPD status: uncontrolled Satisfied with current treatment?: no Oxygen use: no Dyspnea frequency: with exertion Cough frequency: no Rescue inhaler frequency: no   Limitation of activity: yes Productive cough: no Pneumovax: Up to Date Influenza: Up to Date   Relevant past medical, surgical, family and social history reviewed and updated as indicated. Interim medical history since our last visit reviewed. Allergies and medications reviewed and updated.  Review of Systems  Constitutional: Negative.   Respiratory: Positive for shortness of breath. Negative for apnea, cough, choking, chest tightness, wheezing and stridor.   Cardiovascular: Negative.   Gastrointestinal: Negative.   Musculoskeletal: Negative.   Neurological: Negative.   Psychiatric/Behavioral: Negative.     Per HPI unless specifically indicated above     Objective:    BP (!) 118/52 (BP Location: Left Arm, Cuff Size: Large)   Pulse 89   Temp 98.5 F (36.9  C) (Oral)   Wt 219 lb 12.8 oz (99.7 kg)   SpO2 97%   BMI 30.66 kg/m   Wt Readings from Last 3 Encounters:  03/06/20 219 lb 12.8 oz (99.7 kg)  09/03/19 216 lb (98 kg)  09/03/19 216 lb (98 kg)    Physical Exam Vitals and nursing note reviewed.  Constitutional:      General: He is not in acute distress.    Appearance: Normal appearance. He is not ill-appearing, toxic-appearing or diaphoretic.  HENT:     Head: Normocephalic and atraumatic.     Right Ear: External ear normal.     Left Ear: External ear normal.     Nose: Nose normal.     Mouth/Throat:     Mouth: Mucous membranes are moist.     Pharynx: Oropharynx is clear.  Eyes:     General: No scleral icterus.       Right eye: No discharge.        Left eye: No discharge.     Extraocular Movements: Extraocular movements intact.     Conjunctiva/sclera: Conjunctivae normal.     Pupils: Pupils are equal, round, and reactive to light.  Cardiovascular:     Rate and Rhythm: Normal rate and regular rhythm.     Pulses: Normal pulses.     Heart sounds: Normal heart sounds. No murmur heard.  No friction rub. No gallop.   Pulmonary:     Effort: Pulmonary effort is normal. No respiratory distress.     Breath sounds: Normal breath sounds. No stridor. No wheezing, rhonchi or rales.  Chest:     Chest wall: No tenderness.  Musculoskeletal:  General: Normal range of motion.     Cervical back: Normal range of motion and neck supple.  Skin:    General: Skin is warm and dry.     Capillary Refill: Capillary refill takes less than 2 seconds.     Coloration: Skin is not jaundiced or pale.     Findings: No bruising, erythema, lesion or rash.  Neurological:     General: No focal deficit present.     Mental Status: He is alert and oriented to person, place, and time. Mental status is at baseline.  Psychiatric:        Mood and Affect: Mood normal.        Behavior: Behavior normal.        Thought Content: Thought content normal.         Judgment: Judgment normal.     Results for orders placed or performed in visit on 09/03/19  Microscopic Examination   URINE  Result Value Ref Range   WBC, UA None seen 0 - 5 /hpf   RBC 0-2 0 - 2 /hpf   Epithelial Cells (non renal) 0-10 0 - 10 /hpf   Bacteria, UA None seen None seen/Few  CBC with Differential/Platelet  Result Value Ref Range   WBC 10.0 3.4 - 10.8 x10E3/uL   RBC 4.83 4.14 - 5.80 x10E6/uL   Hemoglobin 15.0 13.0 - 17.7 g/dL   Hematocrit 42.9 37.5 - 51.0 %   MCV 89 79 - 97 fL   MCH 31.1 26.6 - 33.0 pg   MCHC 35.0 31 - 35 g/dL   RDW 13.2 11.6 - 15.4 %   Platelets 306 150 - 450 x10E3/uL   Neutrophils 71 Not Estab. %   Lymphs 16 Not Estab. %   Monocytes 10 Not Estab. %   Eos 2 Not Estab. %   Basos 1 Not Estab. %   Neutrophils Absolute 7.2 (H) 1 - 7 x10E3/uL   Lymphocytes Absolute 1.6 0 - 3 x10E3/uL   Monocytes Absolute 1.0 (H) 0 - 0 x10E3/uL   EOS (ABSOLUTE) 0.2 0.0 - 0.4 x10E3/uL   Basophils Absolute 0.1 0 - 0 x10E3/uL   Immature Granulocytes 0 Not Estab. %   Immature Grans (Abs) 0.0 0.0 - 0.1 x10E3/uL  Comprehensive metabolic panel  Result Value Ref Range   Glucose 84 65 - 99 mg/dL   BUN 11 8 - 27 mg/dL   Creatinine, Ser 1.12 0.76 - 1.27 mg/dL   GFR calc non Af Amer 66 >59 mL/min/1.73   GFR calc Af Amer 76 >59 mL/min/1.73   BUN/Creatinine Ratio 10 10 - 24   Sodium 136 134 - 144 mmol/L   Potassium 4.4 3.5 - 5.2 mmol/L   Chloride 99 96 - 106 mmol/L   CO2 22 20 - 29 mmol/L   Calcium 9.4 8.6 - 10.2 mg/dL   Total Protein 8.0 6.0 - 8.5 g/dL   Albumin 5.0 (H) 3.7 - 4.7 g/dL   Globulin, Total 3.0 1.5 - 4.5 g/dL   Albumin/Globulin Ratio 1.7 1.2 - 2.2   Bilirubin Total 0.4 0.0 - 1.2 mg/dL   Alkaline Phosphatase 75 39 - 117 IU/L   AST 17 0 - 40 IU/L   ALT 13 0 - 44 IU/L  Lipid Panel w/o Chol/HDL Ratio  Result Value Ref Range   Cholesterol, Total 185 100 - 199 mg/dL   Triglycerides 96 0 - 149 mg/dL   HDL 49 >39 mg/dL   VLDL Cholesterol Cal 17 5 - 40 mg/dL  LDL Chol Calc (NIH) 119 (H) 0 - 99 mg/dL  Microalbumin, Urine Waived  Result Value Ref Range   Microalb, Ur Waived 10 0 - 19 mg/L   Creatinine, Urine Waived 50 10 - 300 mg/dL   Microalb/Creat Ratio <30 <30 mg/g  PSA  Result Value Ref Range   Prostate Specific Ag, Serum 1.1 0.0 - 4.0 ng/mL  TSH  Result Value Ref Range   TSH 0.865 0.450 - 4.500 uIU/mL  UA/M w/rflx Culture, Routine   Specimen: Urine   URINE  Result Value Ref Range   Specific Gravity, UA 1.015 1.005 - 1.030   pH, UA 6.5 5.0 - 7.5   Color, UA Yellow Yellow   Appearance Ur Clear Clear   Leukocytes,UA Negative Negative   Protein,UA Negative Negative/Trace   Glucose, UA Negative Negative   Ketones, UA Negative Negative   RBC, UA Trace (A) Negative   Bilirubin, UA Negative Negative   Urobilinogen, Ur 0.2 0.2 - 1.0 mg/dL   Nitrite, UA Negative Negative   Microscopic Examination See below:   HCV RNA quant  Result Value Ref Range   Hepatitis C Quantitation HCV Not Detected IU/mL   Test Information Comment       Assessment & Plan:   Problem List Items Addressed This Visit      Cardiovascular and Mediastinum   HTN (hypertension) - Primary    Under good control on current regimen. Continue current regimen. Continue to monitor. Call with any concerns. Refills given. Labs drawn today.       Relevant Medications   benazepril (LOTENSIN) 40 MG tablet   amLODipine (NORVASC) 10 MG tablet   sildenafil (REVATIO) 20 MG tablet   Other Relevant Orders   Comprehensive metabolic panel     Respiratory   COPD (chronic obstructive pulmonary disease) ([place])    Will start anoro. Recheck 1-2 months. Call with any concerns.       Relevant Medications   umeclidinium-vilanterol (ANORO ELLIPTA) 62.5-25 MCG/INH AEPB   Other Relevant Orders   Comprehensive metabolic panel     Other   ED (erectile dysfunction)    Under good control on current regimen. Continue current regimen. Continue to monitor. Call with any concerns.  Refills given. Labs drawn today.       Relevant Medications   sildenafil (REVATIO) 20 MG tablet    Other Visit Diagnoses    Screening for cholesterol level       Labs drawn today. Await results.    Relevant Orders   Lipid Panel w/o Chol/HDL Ratio       Follow up plan: Return 1-2 months, for follow up breathing.

## 2020-03-06 NOTE — Assessment & Plan Note (Signed)
Will start anoro. Recheck 1-2 months. Call with any concerns.

## 2020-03-07 LAB — COMPREHENSIVE METABOLIC PANEL
ALT: 8 IU/L (ref 0–44)
AST: 14 IU/L (ref 0–40)
Albumin/Globulin Ratio: 1.8 (ref 1.2–2.2)
Albumin: 4.6 g/dL (ref 3.7–4.7)
Alkaline Phosphatase: 63 IU/L (ref 48–121)
BUN/Creatinine Ratio: 13 (ref 10–24)
BUN: 15 mg/dL (ref 8–27)
Bilirubin Total: 0.3 mg/dL (ref 0.0–1.2)
CO2: 22 mmol/L (ref 20–29)
Calcium: 8.9 mg/dL (ref 8.6–10.2)
Chloride: 104 mmol/L (ref 96–106)
Creatinine, Ser: 1.17 mg/dL (ref 0.76–1.27)
GFR calc Af Amer: 72 mL/min/{1.73_m2} (ref >59)
GFR calc non Af Amer: 62 mL/min/{1.73_m2} (ref >59)
Globulin, Total: 2.5 g/dL (ref 1.5–4.5)
Glucose: 85 mg/dL (ref 65–99)
Potassium: 4.4 mmol/L (ref 3.5–5.2)
Sodium: 138 mmol/L (ref 134–144)
Total Protein: 7.1 g/dL (ref 6.0–8.5)

## 2020-03-07 LAB — LIPID PANEL W/O CHOL/HDL RATIO
Cholesterol, Total: 160 mg/dL (ref 100–199)
HDL: 45 mg/dL (ref >39)
LDL Chol Calc (NIH): 101 mg/dL — ABNORMAL HIGH (ref 0–99)
Triglycerides: 70 mg/dL (ref 0–149)
VLDL Cholesterol Cal: 14 mg/dL (ref 5–40)

## 2020-03-16 ENCOUNTER — Other Ambulatory Visit: Payer: Self-pay | Admitting: Family Medicine

## 2020-04-25 ENCOUNTER — Other Ambulatory Visit: Payer: Self-pay | Admitting: Family Medicine

## 2020-04-25 NOTE — Telephone Encounter (Signed)
Requested Prescriptions  Pending Prescriptions Disp Refills  . triamcinolone ointment (KENALOG) 0.5 % [Pharmacy Med Name: TRIAMCINOLONE 0.5% OINTMENT] 45 g 0    Sig: APPLY TO AFFECTED AREA TWICE A DAY     Dermatology:  Corticosteroids Passed - 04/25/2020 10:34 AM      Passed - Valid encounter within last 12 months    Recent Outpatient Visits          1 month ago Essential hypertension   [place], [name], DO   7 months ago Routine general medical examination at a health care facility   [facility], [place] P, DO   8 months ago Acute left-sided low back pain with left-sided sciatica   [place], NP   8 months ago Acute bilateral low back pain with left-sided sciatica   [place], [place], DO   1 year ago Essential hypertension   [place], Barb Merino, DO      Future Appointments            In 2 weeks Wynetta Emery, Barb Merino, DO [facility], [facility]

## 2020-05-15 ENCOUNTER — Other Ambulatory Visit: Payer: Self-pay

## 2020-05-15 ENCOUNTER — Encounter: Payer: Self-pay | Admitting: Family Medicine

## 2020-05-15 ENCOUNTER — Ambulatory Visit (INDEPENDENT_AMBULATORY_CARE_PROVIDER_SITE_OTHER): Payer: Medicare Other | Admitting: Family Medicine

## 2020-05-15 VITALS — BP 128/52 | HR 84 | Temp 97.8°F | Ht 71.0 in | Wt 216.0 lb

## 2020-05-15 DIAGNOSIS — J449 Chronic obstructive pulmonary disease, unspecified: Secondary | ICD-10-CM

## 2020-05-15 DIAGNOSIS — I10 Essential (primary) hypertension: Secondary | ICD-10-CM | POA: Diagnosis not present

## 2020-05-15 NOTE — Assessment & Plan Note (Signed)
Doing well off medicine. Will start anoro if he starts feeling more SOB. Refills given. Call with any concerns. Continue to monitor.

## 2020-05-15 NOTE — Progress Notes (Signed)
BP (!) 128/52 (BP Location: Left Arm, Cuff Size: Normal)    Pulse 84    Temp 97.8 F (36.6 C) (Oral)    Ht 5\' 11"  (1.803 m)    Wt 216 lb (98 kg)    SpO2 98%    BMI 30.13 kg/m    Subjective:    Patient ID: Nathan Cooper, male    DOB: 09/23/1945, 74 y.o.   MRN: [id number]  HPI: Nathan Cooper is a 74 y.o. male  Chief Complaint  Patient presents with   Hypertension    Follow up.    HYPERTENSION Hypertension status: controlled  Satisfied with current treatment? yes Duration of hypertension: chronic BP monitoring frequency:  not checking BP medication side effects:  no Medication compliance: excellent compliance Previous BP meds: amlodipine, benazepril Aspirin: yes Recurrent headaches: no Visual changes: no Palpitations: no Dyspnea: no Chest pain: no Lower extremity edema: no Dizzy/lightheaded: no  COPD- hasn't needed the anoro COPD status: stable Satisfied with current treatment?: yes Oxygen use: no Dyspnea frequency: rarely Cough frequency: rarely Rescue inhaler frequency: never   Limitation of activity: no Productive cough: no Pneumovax: Up to Date Influenza: will get next week   Relevant past medical, surgical, family and social history reviewed and updated as indicated. Interim medical history since our last visit reviewed. Allergies and medications reviewed and updated.  Review of Systems  Constitutional: Negative.   Respiratory: Negative.   Cardiovascular: Negative.   Gastrointestinal: Negative.   Musculoskeletal: Negative.   Neurological: Negative.   Psychiatric/Behavioral: Negative.     Per HPI unless specifically indicated above     Objective:    BP (!) 128/52 (BP Location: Left Arm, Cuff Size: Normal)    Pulse 84    Temp 97.8 F (36.6 C) (Oral)    Ht 5\' 11"  (1.803 m)    Wt 216 lb (98 kg)    SpO2 98%    BMI 30.13 kg/m   Wt Readings from Last 3 Encounters:  05/15/20 216 lb (98 kg)  03/06/20 219 lb 12.8 oz (99.7 kg)  09/03/19 216  lb (98 kg)    Physical Exam Vitals and nursing note reviewed.  Constitutional:      General: He is not in acute distress.    Appearance: Normal appearance. He is not ill-appearing, toxic-appearing or diaphoretic.  HENT:     Head: Normocephalic and atraumatic.     Right Ear: External ear normal.     Left Ear: External ear normal.     Nose: Nose normal.     Mouth/Throat:     Mouth: Mucous membranes are moist.     Pharynx: Oropharynx is clear.  Eyes:     General: No scleral icterus.       Right eye: No discharge.        Left eye: No discharge.     Extraocular Movements: Extraocular movements intact.     Conjunctiva/sclera: Conjunctivae normal.     Pupils: Pupils are equal, round, and reactive to light.  Cardiovascular:     Rate and Rhythm: Normal rate and regular rhythm.     Pulses: Normal pulses.     Heart sounds: Normal heart sounds. No murmur heard.  No friction rub. No gallop.   Pulmonary:     Effort: Pulmonary effort is normal. No respiratory distress.     Breath sounds: Normal breath sounds. No stridor. No wheezing, rhonchi or rales.  Chest:     Chest wall: No tenderness.  Musculoskeletal:  General: Normal range of motion.     Cervical back: Normal range of motion and neck supple.  Skin:    General: Skin is warm and dry.     Capillary Refill: Capillary refill takes less than 2 seconds.     Coloration: Skin is not jaundiced or pale.     Findings: No bruising, erythema, lesion or rash.  Neurological:     General: No focal deficit present.     Mental Status: He is alert and oriented to person, place, and time. Mental status is at baseline.  Psychiatric:        Mood and Affect: Mood normal.        Behavior: Behavior normal.        Thought Content: Thought content normal.        Judgment: Judgment normal.     Results for orders placed or performed in visit on 03/06/20  Comprehensive metabolic panel  Result Value Ref Range   Glucose 85 65 - 99 mg/dL   BUN 15  8 - 27 mg/dL   Creatinine, Ser 1.17 0.76 - 1.27 mg/dL   GFR calc non Af Amer 62 >59 mL/min/1.73   GFR calc Af Amer 72 >59 mL/min/1.73   BUN/Creatinine Ratio 13 10 - 24   Sodium 138 134 - 144 mmol/L   Potassium 4.4 3.5 - 5.2 mmol/L   Chloride 104 96 - 106 mmol/L   CO2 22 20 - 29 mmol/L   Calcium 8.9 8.6 - 10.2 mg/dL   Total Protein 7.1 6.0 - 8.5 g/dL   Albumin 4.6 3.7 - 4.7 g/dL   Globulin, Total 2.5 1.5 - 4.5 g/dL   Albumin/Globulin Ratio 1.8 1.2 - 2.2   Bilirubin Total 0.3 0.0 - 1.2 mg/dL   Alkaline Phosphatase 63 48 - 121 IU/L   AST 14 0 - 40 IU/L   ALT 8 0 - 44 IU/L  Lipid Panel w/o Chol/HDL Ratio  Result Value Ref Range   Cholesterol, Total 160 100 - 199 mg/dL   Triglycerides 70 0 - 149 mg/dL   HDL 45 >39 mg/dL   VLDL Cholesterol Cal 14 5 - 40 mg/dL   LDL Chol Calc (NIH) 101 (H) 0 - 99 mg/dL      Assessment & Plan:   Problem List Items Addressed This Visit      Cardiovascular and Mediastinum   HTN (hypertension)    Under good control on current regimen. Continue current regimen. Continue to monitor. Call with any concerns. Refills up to date.         Respiratory   COPD (chronic obstructive pulmonary disease) ([place]) - Primary    Doing well off medicine. Will start anoro if he starts feeling more SOB. Refills given. Call with any concerns. Continue to monitor.       Relevant Orders   CBC with Differential/Platelet       Follow up plan: Return February Physical.

## 2020-05-15 NOTE — Assessment & Plan Note (Addendum)
Under good control on current regimen. Continue current regimen. Continue to monitor. Call with any concerns. Refills up to date.   

## 2020-05-16 ENCOUNTER — Telehealth: Payer: Self-pay

## 2020-05-16 LAB — CBC WITH DIFFERENTIAL/PLATELET
Basophils Absolute: 0 10*3/uL (ref 0.0–0.2)
Basos: 0 %
EOS (ABSOLUTE): 0.2 10*3/uL (ref 0.0–0.4)
Eos: 3 %
Hematocrit: 42.3 % (ref 37.5–51.0)
Hemoglobin: 14.2 g/dL (ref 13.0–17.7)
Immature Grans (Abs): 0 10*3/uL (ref 0.0–0.1)
Immature Granulocytes: 0 %
Lymphocytes Absolute: 1.3 10*3/uL (ref 0.7–3.1)
Lymphs: 14 %
MCH: 31.1 pg (ref 26.6–33.0)
MCHC: 33.6 g/dL (ref 31.5–35.7)
MCV: 93 fL (ref 79–97)
Monocytes Absolute: 1.2 10*3/uL — ABNORMAL HIGH (ref 0.1–0.9)
Monocytes: 13 %
Neutrophils Absolute: 6.6 10*3/uL (ref 1.4–7.0)
Neutrophils: 70 %
Platelets: 317 10*3/uL (ref 150–450)
RBC: 4.57 x10E6/uL (ref 4.14–5.80)
RDW: 13.2 % (ref 11.6–15.4)
WBC: 9.4 10*3/uL (ref 3.4–10.8)

## 2020-05-16 NOTE — Telephone Encounter (Signed)
-----   Message from Valerie Roys, [place] sent at 05/15/2020 12:59 PM EDT ----- February Physical

## 2020-05-16 NOTE — Telephone Encounter (Signed)
lvm to make this physical apt for feburary

## 2020-05-26 ENCOUNTER — Other Ambulatory Visit: Payer: Self-pay

## 2020-05-26 ENCOUNTER — Ambulatory Visit (INDEPENDENT_AMBULATORY_CARE_PROVIDER_SITE_OTHER): Payer: Medicare Other

## 2020-05-26 DIAGNOSIS — Z23 Encounter for immunization: Secondary | ICD-10-CM | POA: Diagnosis not present

## 2020-08-15 ENCOUNTER — Other Ambulatory Visit: Payer: Self-pay | Admitting: Family Medicine

## 2020-08-16 NOTE — Telephone Encounter (Signed)
Requested Prescriptions  Pending Prescriptions Disp Refills  . aspirin 81 MG EC tablet [Pharmacy Med Name: [facility] ASPIRIN EC 81 MG TABLET] 120 tablet 3    Sig: TAKE 1 TABLET BY MOUTH EVERY DAY     Analgesics:  NSAIDS - aspirin Passed - 08/15/2020  8:49 PM      Passed - Patient is not pregnant      Passed - Valid encounter within last 12 months    Recent Outpatient Visits          3 months ago Chronic obstructive pulmonary disease, unspecified COPD type ([place])   [place], [name], DO   5 months ago Essential hypertension   [place], [name], DO   11 months ago Routine general medical examination at a health care facility   [place], [place], DO   1 year ago Acute left-sided low back pain with left-sided sciatica   [facility] Eulogio Bear, NP   1 year ago Acute bilateral low back pain with left-sided sciatica   [facility] Valerie Roys, DO      Future Appointments            In 3 weeks Wynetta Emery, Barb Merino, DO [facility], [facility]

## 2020-09-06 ENCOUNTER — Other Ambulatory Visit: Payer: Self-pay | Admitting: Family Medicine

## 2020-09-06 DIAGNOSIS — I10 Essential (primary) hypertension: Secondary | ICD-10-CM

## 2020-09-06 NOTE — Telephone Encounter (Signed)
Requested Prescriptions  Pending Prescriptions Disp Refills  . amLODipine (NORVASC) 10 MG tablet [Pharmacy Med Name: AMLODIPINE BESYLATE 10 MG TAB] 90 tablet 0    Sig: TAKE 1 TABLET BY MOUTH EVERY DAY     Cardiovascular:  Calcium Channel Blockers Passed - 09/06/2020  3:37 PM      Passed - Last BP in normal range    BP Readings from Last 1 Encounters:  05/15/20 (!) 128/52         Passed - Valid encounter within last 6 months    Recent Outpatient Visits          3 months ago Chronic obstructive pulmonary disease, unspecified COPD type ([place])   [name], [name], DO   6 months ago Essential hypertension   [name], [name], DO   1 year ago Routine general medical examination at a health care facility   [facility], [place], DO   1 year ago Acute left-sided low back pain with left-sided sciatica   [facility] Eulogio Bear, NP   1 year ago Acute bilateral low back pain with left-sided sciatica   [facility] Valerie Roys, DO      Future Appointments            In 5 days Wynetta Emery, Barb Merino, DO [facility], [facility]

## 2020-09-11 ENCOUNTER — Other Ambulatory Visit: Payer: Self-pay

## 2020-09-11 ENCOUNTER — Ambulatory Visit (INDEPENDENT_AMBULATORY_CARE_PROVIDER_SITE_OTHER): Payer: Medicare Other | Admitting: Family Medicine

## 2020-09-11 ENCOUNTER — Encounter: Payer: Self-pay | Admitting: Family Medicine

## 2020-09-11 VITALS — BP 127/74 | HR 84 | Temp 98.2°F | Ht 70.5 in | Wt 216.0 lb

## 2020-09-11 DIAGNOSIS — J449 Chronic obstructive pulmonary disease, unspecified: Secondary | ICD-10-CM | POA: Diagnosis not present

## 2020-09-11 DIAGNOSIS — I10 Essential (primary) hypertension: Secondary | ICD-10-CM | POA: Diagnosis not present

## 2020-09-11 DIAGNOSIS — I700 Atherosclerosis of aorta: Secondary | ICD-10-CM | POA: Diagnosis not present

## 2020-09-11 DIAGNOSIS — N529 Male erectile dysfunction, unspecified: Secondary | ICD-10-CM

## 2020-09-11 DIAGNOSIS — Z125 Encounter for screening for malignant neoplasm of prostate: Secondary | ICD-10-CM

## 2020-09-11 DIAGNOSIS — Z87891 Personal history of nicotine dependence: Secondary | ICD-10-CM | POA: Diagnosis not present

## 2020-09-11 DIAGNOSIS — Z1211 Encounter for screening for malignant neoplasm of colon: Secondary | ICD-10-CM

## 2020-09-11 DIAGNOSIS — Z0000 Encounter for general adult medical examination without abnormal findings: Secondary | ICD-10-CM | POA: Diagnosis not present

## 2020-09-11 DIAGNOSIS — B182 Chronic viral hepatitis C: Secondary | ICD-10-CM

## 2020-09-11 DIAGNOSIS — Z720 Tobacco use: Secondary | ICD-10-CM | POA: Diagnosis not present

## 2020-09-11 LAB — URINALYSIS, ROUTINE W REFLEX MICROSCOPIC
Bilirubin, UA: NEGATIVE
Glucose, UA: NEGATIVE
Leukocytes,UA: NEGATIVE
Nitrite, UA: NEGATIVE
Protein,UA: NEGATIVE
RBC, UA: NEGATIVE
Specific Gravity, UA: 1.025 (ref 1.005–1.030)
Urobilinogen, Ur: 4 mg/dL — ABNORMAL HIGH (ref 0.2–1.0)
pH, UA: 6.5 (ref 5.0–7.5)

## 2020-09-11 LAB — MICROALBUMIN, URINE WAIVED
Creatinine, Urine Waived: 300 mg/dL (ref 10–300)
Microalb, Ur Waived: 30 mg/L — ABNORMAL HIGH (ref 0–19)
Microalb/Creat Ratio: <30 mg/g (ref <30)

## 2020-09-11 MED ORDER — SILDENAFIL CITRATE 20 MG PO TABS
ORAL_TABLET | ORAL | 4 refills | Status: DC
Start: 2020-09-11 — End: 2021-04-22

## 2020-09-11 MED ORDER — TRIAMCINOLONE ACETONIDE 0.5 % EX OINT: TOPICAL_OINTMENT | CUTANEOUS | 0 refills | Status: DC

## 2020-09-11 MED ORDER — BENAZEPRIL HCL 40 MG PO TABS
40.0000 mg | ORAL_TABLET | Freq: Every day | ORAL | 1 refills | Status: DC
Start: 2020-09-11 — End: 2021-04-20

## 2020-09-11 MED ORDER — VALACYCLOVIR HCL 500 MG PO TABS: 500.0000 mg | ORAL_TABLET | Freq: Two times a day (BID) | ORAL | 5 refills | Status: DC | PRN

## 2020-09-11 MED ORDER — AMLODIPINE BESYLATE 10 MG PO TABS: 10.0000 mg | ORAL_TABLET | Freq: Every day | ORAL | 1 refills | Status: DC

## 2020-09-11 NOTE — Assessment & Plan Note (Signed)
Under good control on current regimen. Continue current regimen. Continue to monitor. Call with any concerns. Refills given.   

## 2020-09-11 NOTE — Assessment & Plan Note (Signed)
Not wanting to start medicine. Has anoro at the pharmacy if he changes his mind. Continue to monitor. Call with any concerns.

## 2020-09-11 NOTE — Progress Notes (Signed)
BP 127/74   Pulse 84   Temp 98.2 F (36.8 C)   Ht 5' 10.5" (1.791 m)   Wt 216 lb (98 kg)   SpO2 99%   BMI 30.55 kg/m    Subjective:    Patient ID: Nathan Cooper, male    DOB: Mar 22, 1946, 75 y.o.   MRN: [id number]  HPI: Nathan Cooper is a 75 y.o. male presenting on 09/11/2020 for comprehensive medical examination. Current medical complaints include:  HYPERTENSION Hypertension status: controlled  Satisfied with current treatment? yes Duration of hypertension: chronic BP monitoring frequency:  not checking BP medication side effects:  no Medication compliance: excellent compliance Previous BP meds: Aspirin: yes Recurrent headaches: no Visual changes: no Palpitations: no Dyspnea: no Chest pain: no Lower extremity edema: no Dizzy/lightheaded: no  COPD COPD status: controlled Satisfied with current treatment?: no Oxygen use: no Dyspnea frequency: most of the time.  Cough frequency: very sledom Rescue inhaler frequency:  never Limitation of activity: no Productive cough: no Pneumovax: Up to Date Influenza: Up to Date  Interim Problems from his last visit: no  Depression Screen done today and results listed below:  Depression screen [facility] 2/9 09/11/2020 05/15/2020 08/28/2018 08/24/2017 08/17/2016  Decreased Interest 0 0 0 0 0  Down, Depressed, Hopeless 0 0 0 0 0  PHQ - 2 Score 0 0 0 0 0  Altered sleeping - 0 - - -  Tired, decreased energy - 0 - - -  Change in appetite - 0 - - -  Feeling bad or failure about yourself  - 0 - - -  Trouble concentrating - 0 - - -  Moving slowly or fidgety/restless - 0 - - -  Suicidal thoughts - 0 - - -  PHQ-9 Score - 0 - - -  Difficult doing work/chores - Not difficult at all - - -    Past Medical History:  Past Medical History:  Diagnosis Date  . COPD (chronic obstructive pulmonary disease) (HCC)   . Coronary artery disease   . Hepatitis C   . Hernia, inguinal 2014  . Hypertension 2009  . IFG (impaired fasting  glucose) 07/22/2015  . Personal history of tobacco use, presenting hazards to health 08/26/2015  . Sleep apnea     Surgical History:  Past Surgical History:  Procedure Laterality Date  . COLONOSCOPY  2010  . COLONOSCOPY WITH PROPOFOL N/A 09/21/2017   Procedure: COLONOSCOPY WITH PROPOFOL;  Surgeon: Pasty Spillers, MD;  Location: [facility] ENDOSCOPY;  Service: Endoscopy;  Laterality: N/A;  . HERNIA REPAIR  2014    Medications:  Current Outpatient Medications on File Prior to Visit  Medication Sig  . aspirin 81 MG EC tablet TAKE 1 TABLET BY MOUTH EVERY DAY  . umeclidinium-vilanterol (ANORO ELLIPTA) 62.5-25 MCG/INH AEPB Inhale 1 puff into the lungs daily. (Patient not taking: Reported on 09/11/2020)   No current facility-administered medications on file prior to visit.    Allergies:  No Known Allergies  Social History:  Social History   Socioeconomic History  . Marital status: Divorced    Spouse name: Not on file  . Number of children: Not on file  . Years of education: Not on file  . Highest education level: Associate degree: academic program  Occupational History  . Not on file  Tobacco Use  . Smoking status: Current Every Day Smoker    Packs/day: 1.00    Years: 47.50    Pack years: 47.50    Types: Cigarettes  .  Smokeless tobacco: Never Used  . Tobacco comment: has had 5 since jan 2019  Vaping Use  . Vaping Use: Some days  . Start date: 08/28/2017  . Devices: jewel  Substance and Sexual Activity  . Alcohol use: No    Alcohol/week: 0.0 standard drinks  . Drug use: No  . Sexual activity: Not on file  Other Topics Concern  . 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 Packs/day: 1.00  . Years: 47.50  . Pack years: 47.50  . Types: Cigarettes  Smokeless Tobacco Never Used  Tobacco Comment   has had 5 since jan 2019   Social History   Substance and Sexual Activity  Alcohol Use No  . Alcohol/week: 0.0 standard drinks    Family History:  Family History  Problem Relation Age of Onset  . Hypertension Mother   . Cirrhosis Father   . Gout Son   . Heart disease Maternal Uncle 50  . Heart attack Maternal Uncle   . Cancer Paternal Uncle   . Diabetes Paternal Grandmother     Past medical history, surgical history, medications, allergies, family history and social history reviewed with patient today and changes made to appropriate areas of the chart.   Review of Systems  Constitutional: Negative.   HENT: Negative.   Eyes: Negative.   Respiratory: Positive for shortness of breath. Negative for cough, hemoptysis, sputum production and wheezing.   Cardiovascular: Negative.   Gastrointestinal: Negative.   Genitourinary: Negative.   Musculoskeletal: Negative.   Skin: Negative.   Neurological: Positive for tingling (bottoms of his legs). Negative for dizziness, tremors, sensory change, speech change, focal weakness, seizures, loss of consciousness, weakness and headaches.  Endo/Heme/Allergies: Negative.   Psychiatric/Behavioral: Negative.    All other ROS negative except what is listed above and in the HPI.      Objective:    BP 127/74   Pulse 84   Temp 98.2 F (36.8 C)   Ht 5' 10.5" (1.791 m)   Wt 216 lb (98 kg)   SpO2 99%   BMI 30.55 kg/m   Wt Readings from Last 3 Encounters:  09/11/20 216 lb (98 kg)  05/15/20 216 lb (98 kg)  03/06/20 219 lb 12.8 oz (99.7 kg)    Physical Exam Vitals and nursing note reviewed.  Constitutional:      General: He is not in acute distress.    Appearance:  Normal appearance. He is obese. He is not ill-appearing, toxic-appearing or diaphoretic.  HENT:     Head: Normocephalic and atraumatic.     Right Ear: Tympanic membrane, ear canal and external ear normal. There is no impacted cerumen.     Left Ear: Tympanic membrane, ear canal and external ear normal. There is no impacted cerumen.     Nose: Nose normal. No congestion or rhinorrhea.     Mouth/Throat:     Mouth: Mucous membranes are moist.     Pharynx: Oropharynx is clear.  No oropharyngeal exudate or posterior oropharyngeal erythema.  Eyes:     General: No scleral icterus.       Right eye: No discharge.        Left eye: No discharge.     Extraocular Movements: Extraocular movements intact.     Conjunctiva/sclera: Conjunctivae normal.     Pupils: Pupils are equal, round, and reactive to light.  Neck:     Vascular: No carotid bruit.  Cardiovascular:     Rate and Rhythm: Normal rate and regular rhythm.     Pulses: Normal pulses.     Heart sounds: No murmur heard. No friction rub. No gallop.   Pulmonary:     Effort: Pulmonary effort is normal. No respiratory distress.     Breath sounds: Normal breath sounds. No stridor. No wheezing, rhonchi or rales.  Chest:     Chest wall: No tenderness.  Abdominal:     General: Abdomen is flat. Bowel sounds are normal. There is no distension.     Palpations: Abdomen is soft. There is no mass.     Tenderness: There is no abdominal tenderness. There is no right CVA tenderness, left CVA tenderness, guarding or rebound.     Hernia: No hernia is present.  Genitourinary:    Comments: Genital exam deferred with shared decision making Musculoskeletal:        General: No swelling, tenderness, deformity or signs of injury.     Cervical back: Normal range of motion and neck supple. No rigidity. No muscular tenderness.     Right lower leg: No edema.     Left lower leg: No edema.  Lymphadenopathy:     Cervical: No cervical adenopathy.  Skin:    General:  Skin is warm and dry.     Capillary Refill: Capillary refill takes less than 2 seconds.     Coloration: Skin is not jaundiced or pale.     Findings: No bruising, erythema, lesion or rash.  Neurological:     General: No focal deficit present.     Mental Status: He is alert and oriented to person, place, and time.     Cranial Nerves: No cranial nerve deficit.     Sensory: No sensory deficit.     Motor: No weakness.     Coordination: Coordination normal.     Gait: Gait normal.     Deep Tendon Reflexes: Reflexes normal.  Psychiatric:        Mood and Affect: Mood normal.        Behavior: Behavior normal.        Thought Content: Thought content normal.        Judgment: Judgment normal.     Results for orders placed or performed in visit on 05/15/20  CBC with Differential/Platelet  Result Value Ref Range   WBC 9.4 3.4 - 10.8 x10E3/uL   RBC 4.57 4.14 - 5.80 x10E6/uL   Hemoglobin 14.2 13.0 - 17.7 g/dL   Hematocrit 30.8 65.7 - 51.0 %   MCV 93 79 - 97 fL   MCH 31.1 26.6 - 33.0 pg   MCHC 33.6 31.5 - 35.7 g/dL   RDW 84.6 96.2 - 95.2 %   Platelets 317 150 - 450 x10E3/uL   Neutrophils 70 Not Estab. %   Lymphs 14 Not Estab. %   Monocytes 13 Not Estab. %   Eos 3 Not Estab. %   Basos 0 Not Estab. %   Neutrophils Absolute 6.6 1.4 - 7.0 x10E3/uL   Lymphocytes  Absolute 1.3 0.7 - 3.1 x10E3/uL   Monocytes Absolute 1.2 (H) 0.1 - 0.9 x10E3/uL   EOS (ABSOLUTE) 0.2 0.0 - 0.4 x10E3/uL   Basophils Absolute 0.0 0.0 - 0.2 x10E3/uL   Immature Granulocytes 0 Not Estab. %   Immature Grans (Abs) 0.0 0.0 - 0.1 x10E3/uL      Assessment & Plan:   Problem List Items Addressed This Visit      Cardiovascular and Mediastinum   HTN (hypertension)   Relevant Medications   benazepril (LOTENSIN) 40 MG tablet   amLODipine (NORVASC) 10 MG tablet   sildenafil (REVATIO) 20 MG tablet   Other Relevant Orders   Comprehensive metabolic panel   TSH   Microalbumin, Urine Waived   Atherosclerosis of aorta (HCC)     Will keep BP and cholesterol under good control. Call with any concerns. Continue to monitor.       Relevant Medications   benazepril (LOTENSIN) 40 MG tablet   amLODipine (NORVASC) 10 MG tablet   sildenafil (REVATIO) 20 MG tablet   Other Relevant Orders   Comprehensive metabolic panel   Lipid Panel w/o Chol/HDL Ratio     Respiratory   COPD (chronic obstructive pulmonary disease) (HCC)    Not wanting to start medicine. Has anoro at the pharmacy if he changes his mind. Continue to monitor. Call with any concerns.       Relevant Orders   Comprehensive metabolic panel   CBC with Differential/Platelet     Digestive   Hep C w/o coma, chronic (HCC)    S/P treatment. Doing well.       Relevant Medications   valACYclovir (VALTREX) 500 MG tablet     Other   Tobacco abuse   Relevant Orders   Comprehensive metabolic panel   Urinalysis, Routine w reflex microscopic   Personal history of tobacco use, presenting hazards to health    Has not had low dose CT since 2019. Will get him scheduled. Call with any concerns.       ED (erectile dysfunction)    Under good control on current regimen. Continue current regimen. Continue to monitor. Call with any concerns. Refills given.        Relevant Medications   sildenafil (REVATIO) 20 MG tablet    Other Visit Diagnoses    Routine general medical examination at a health care facility    -  Primary   Vaccines up to date. Screening labs checked today. Colonoscopy ordered. Call with any concerns. Continue to monitor.    Essential hypertension       Relevant Medications   benazepril (LOTENSIN) 40 MG tablet   amLODipine (NORVASC) 10 MG tablet   sildenafil (REVATIO) 20 MG tablet   Screening for prostate cancer       Labs drawn today. Await results.    Relevant Orders   PSA   Screening for colon cancer       Referral to GI made today.   Relevant Orders   Ambulatory referral to Gastroenterology       Discussed aspirin prophylaxis  for myocardial infarction prevention and decision was made to continue ASA  LABORATORY TESTING:  Health maintenance labs ordered today as discussed above.   The natural history of prostate cancer and ongoing controversy regarding screening and potential treatment outcomes of prostate cancer has been discussed with the patient. The meaning of a false positive PSA and a false negative PSA has been discussed. He indicates understanding of the limitations of this screening test and  wishes to proceed with screening PSA testing.   IMMUNIZATIONS:   - Tdap: Tetanus vaccination status reviewed: last tetanus booster within 10 years. - Influenza: Up to date - Pneumovax: Up to date - Prevnar: Up to date - COVID: Up to date  SCREENING: - Colonoscopy: Ordered today  Discussed with patient purpose of the colonoscopy is to detect colon cancer at curable precancerous or early stages   PATIENT COUNSELING:    Sexuality: Discussed sexually transmitted diseases, partner selection, use of condoms, avoidance of unintended pregnancy  and contraceptive alternatives.   Advised to avoid cigarette smoking.  I discussed with the patient that most people either abstain from alcohol or drink within safe limits (<=14/week and <=4 drinks/occasion for males, <=7/weeks and <= 3 drinks/occasion for females) and that the risk for alcohol disorders and other health effects rises proportionally with the number of drinks per week and how often a drinker exceeds daily limits.  Discussed cessation/primary prevention of drug use and availability of treatment for abuse.   Diet: Encouraged to adjust caloric intake to maintain  or achieve ideal body weight, to reduce intake of dietary saturated fat and total fat, to limit sodium intake by avoiding high sodium foods and not adding table salt, and to maintain adequate dietary potassium and calcium preferably from fresh fruits, vegetables, and low-fat dairy products.    stressed the  importance of regular exercise  Injury prevention: Discussed safety belts, safety helmets, smoke detector, smoking near bedding or upholstery.   Dental health: Discussed importance of regular tooth brushing, flossing, and dental visits.   Follow up plan: NEXT PREVENTATIVE PHYSICAL DUE IN 1 YEAR. Return in about 6 months (around 03/11/2021).

## 2020-09-11 NOTE — Assessment & Plan Note (Signed)
Will keep BP and cholesterol under good control. Call with any concerns. Continue to monitor.  

## 2020-09-11 NOTE — Assessment & Plan Note (Signed)
Has not had low dose CT since 2019. Will get him scheduled. Call with any concerns.

## 2020-09-11 NOTE — Patient Instructions (Signed)
Health Maintenance After Age 73 After age 73, you are at a higher risk for certain long-term diseases and infections as well as injuries from falls. Falls are a major cause of broken bones and head injuries in people who are older than age 73. Getting regular preventive care can help to keep you healthy and well. Preventive care includes getting regular testing and making lifestyle changes as recommended by your health care provider. Talk with your health care provider about:  Which screenings and tests you should have. A screening is a test that checks for a disease when you have no symptoms.  A diet and exercise plan that is right for you. What should I know about screenings and tests to prevent falls? Screening and testing are the best ways to find a health problem early. Early diagnosis and treatment give you the best chance of managing medical conditions that are common after age 73. Certain conditions and lifestyle choices may make you more likely to have a fall. Your health care provider may recommend:  Regular vision checks. Poor vision and conditions such as cataracts can make you more likely to have a fall. If you wear glasses, make sure to get your prescription updated if your vision changes.  Medicine review. Work with your health care provider to regularly review all of the medicines you are taking, including over-the-counter medicines. Ask your health care provider about any side effects that may make you more likely to have a fall. Tell your health care provider if any medicines that you take make you feel dizzy or sleepy.  Osteoporosis screening. Osteoporosis is a condition that causes the bones to get weaker. This can make the bones weak and cause them to break more easily.  Blood pressure screening. Blood pressure changes and medicines to control blood pressure can make you feel dizzy.  Strength and balance checks. Your health care provider may recommend certain tests to check your  strength and balance while standing, walking, or changing positions.  Foot health exam. Foot pain and numbness, as well as not wearing proper footwear, can make you more likely to have a fall.  Depression screening. You may be more likely to have a fall if you have a fear of falling, feel emotionally low, or feel unable to do activities that you used to do.  Alcohol use screening. Using too much alcohol can affect your balance and may make you more likely to have a fall. What actions can I take to lower my risk of falls? General instructions  Talk with your health care provider about your risks for falling. Tell your health care provider if: ? You fall. Be sure to tell your health care provider about all falls, even ones that seem minor. ? You feel dizzy, sleepy, or off-balance.  Take over-the-counter and prescription medicines only as told by your health care provider. These include any supplements.  Eat a healthy diet and maintain a healthy weight. A healthy diet includes low-fat dairy products, low-fat (lean) meats, and fiber from whole grains, beans, and lots of fruits and vegetables. Home safety  Remove any tripping hazards, such as rugs, cords, and clutter.  Install safety equipment such as grab bars in bathrooms and safety rails on stairs.  Keep rooms and walkways well-lit. Activity  Follow a regular exercise program to stay fit. This will help you maintain your balance. Ask your health care provider what types of exercise are appropriate for you.  If you need a cane or walker,   use it as recommended by your health care provider.  Wear supportive shoes that have nonskid soles.   Lifestyle  Do not drink alcohol if your health care provider tells you not to drink.  If you drink alcohol, limit how much you have: ? 0-1 drink a day for women. ? 0-2 drinks a day for men.  Be aware of how much alcohol is in your drink. In the [place] one drink equals one typical bottle of beer (12  oz), one-half glass of wine (5 oz), or one shot of hard liquor (1 oz).  Do not use any products that contain nicotine or tobacco, such as cigarettes and e-cigarettes. If you need help quitting, ask your health care provider. Summary  Having a healthy lifestyle and getting preventive care can help to protect your health and wellness after age 73.  Screening and testing are the best way to find a health problem early and help you avoid having a fall. Early diagnosis and treatment give you the best chance for managing medical conditions that are more common for people who are older than age 73.  Falls are a major cause of broken bones and head injuries in people who are older than age 73. Take precautions to prevent a fall at home.  Work with your health care provider to learn what changes you can make to improve your health and wellness and to prevent falls. This information is not intended to replace advice given to you by your health care provider. Make sure you discuss any questions you have with your health care provider. Document Revised: 10/26/2018 Document Reviewed: 05/18/2017 Elsevier Patient Education  2021 Elsevier Inc.  

## 2020-09-11 NOTE — Assessment & Plan Note (Signed)
S/P treatment. Doing well.

## 2020-09-12 ENCOUNTER — Telehealth: Payer: Self-pay | Admitting: *Deleted

## 2020-09-12 LAB — COMPREHENSIVE METABOLIC PANEL
ALT: 11 IU/L (ref 0–44)
AST: 15 IU/L (ref 0–40)
Albumin/Globulin Ratio: 1.9 (ref 1.2–2.2)
Albumin: 4.7 g/dL (ref 3.7–4.7)
Alkaline Phosphatase: 75 IU/L (ref 44–121)
BUN/Creatinine Ratio: 13 (ref 10–24)
BUN: 14 mg/dL (ref 8–27)
Bilirubin Total: 0.3 mg/dL (ref 0.0–1.2)
CO2: 20 mmol/L (ref 20–29)
Calcium: 8.8 mg/dL (ref 8.6–10.2)
Chloride: 103 mmol/L (ref 96–106)
Creatinine, Ser: 1.05 mg/dL (ref 0.76–1.27)
GFR calc Af Amer: 82 mL/min/{1.73_m2} (ref >59)
GFR calc non Af Amer: 71 mL/min/{1.73_m2} (ref >59)
Globulin, Total: 2.5 g/dL (ref 1.5–4.5)
Glucose: 108 mg/dL — ABNORMAL HIGH (ref 65–99)
Potassium: 4.4 mmol/L (ref 3.5–5.2)
Sodium: 138 mmol/L (ref 134–144)
Total Protein: 7.2 g/dL (ref 6.0–8.5)

## 2020-09-12 LAB — CBC WITH DIFFERENTIAL/PLATELET
Basophils Absolute: 0 10*3/uL (ref 0.0–0.2)
Basos: 1 %
EOS (ABSOLUTE): 0.2 10*3/uL (ref 0.0–0.4)
Eos: 2 %
Hematocrit: 40.8 % (ref 37.5–51.0)
Hemoglobin: 14 g/dL (ref 13.0–17.7)
Immature Grans (Abs): 0 10*3/uL (ref 0.0–0.1)
Immature Granulocytes: 0 %
Lymphocytes Absolute: 1.4 10*3/uL (ref 0.7–3.1)
Lymphs: 17 %
MCH: 31.1 pg (ref 26.6–33.0)
MCHC: 34.3 g/dL (ref 31.5–35.7)
MCV: 91 fL (ref 79–97)
Monocytes Absolute: 1 10*3/uL — ABNORMAL HIGH (ref 0.1–0.9)
Monocytes: 12 %
Neutrophils Absolute: 5.6 10*3/uL (ref 1.4–7.0)
Neutrophils: 68 %
Platelets: 286 10*3/uL (ref 150–450)
RBC: 4.5 x10E6/uL (ref 4.14–5.80)
RDW: 13.3 % (ref 11.6–15.4)
WBC: 8.3 10*3/uL (ref 3.4–10.8)

## 2020-09-12 LAB — TSH: TSH: 0.706 u[IU]/mL (ref 0.450–4.500)

## 2020-09-12 LAB — LIPID PANEL W/O CHOL/HDL RATIO
Cholesterol, Total: 153 mg/dL (ref 100–199)
HDL: 44 mg/dL (ref >39)
LDL Chol Calc (NIH): 91 mg/dL (ref 0–99)
Triglycerides: 94 mg/dL (ref 0–149)
VLDL Cholesterol Cal: 18 mg/dL (ref 5–40)

## 2020-09-12 LAB — PSA: Prostate Specific Ag, Serum: 2.3 ng/mL (ref 0.0–4.0)

## 2020-09-12 NOTE — Telephone Encounter (Signed)
Attempted to contact and schedule lung screening scan. Message left for patient to call back to schedule. 

## 2020-09-18 ENCOUNTER — Telehealth: Payer: Self-pay | Admitting: *Deleted

## 2020-09-18 NOTE — Telephone Encounter (Signed)
Attempted to contact patient to schedule lung screening. Left message to call Shawn at [phone]. 

## 2020-09-19 ENCOUNTER — Telehealth: Payer: Self-pay | Admitting: Licensed Clinical Social Worker

## 2020-09-19 NOTE — Telephone Encounter (Signed)
Left patient a message to call and schedule lung screening CT

## 2020-09-22 ENCOUNTER — Encounter: Payer: Self-pay | Admitting: *Deleted

## 2020-10-01 ENCOUNTER — Telehealth: Payer: Self-pay | Admitting: Family Medicine

## 2020-10-01 NOTE — Telephone Encounter (Signed)
Copied from [place] [id number]. Topic: Medicare AWV >> Oct 01, 2020 12:37 PM Cher Nakai R wrote: Reason for CRM:  Left message for patient to call back and schedule the Medicare Annual Wellness Visit (AWV) virtually or by telephone.  Last AWV 09/03/2019  Please schedule at anytime with [facility] Health Advisor.  45 minute appointment  Any questions, please call me at [phone]

## 2021-01-29 ENCOUNTER — Encounter: Payer: Self-pay | Admitting: Family Medicine

## 2021-03-12 ENCOUNTER — Ambulatory Visit: Payer: Medicare Other | Admitting: Family Medicine

## 2021-04-07 ENCOUNTER — Telehealth: Payer: Self-pay

## 2021-04-07 ENCOUNTER — Ambulatory Visit: Payer: Medicare Other

## 2021-04-07 NOTE — Telephone Encounter (Signed)
Contacted patient to do Medicare AWV-left a VM requesting he call the office for scheduling

## 2021-04-09 ENCOUNTER — Other Ambulatory Visit: Payer: Self-pay | Admitting: Family Medicine

## 2021-04-09 DIAGNOSIS — I10 Essential (primary) hypertension: Secondary | ICD-10-CM

## 2021-04-09 NOTE — Telephone Encounter (Signed)
Lmom asking pt to call back to schedule a follow up appt.

## 2021-04-09 NOTE — Telephone Encounter (Signed)
Patient is one month over due for follow up visit. Please call and schedule a follow up and then route to provider for medication refill.

## 2021-04-09 NOTE — Telephone Encounter (Signed)
Requested medications are due for refill today.  yes  Requested medications are on the active medications list.  yes  Last refill. 09/11/2020  Future visit scheduled.   no  Notes to clinic.  Labs are expired. Pt last seen 09/11/2020.

## 2021-04-11 ENCOUNTER — Telehealth: Payer: Self-pay

## 2021-04-11 NOTE — Telephone Encounter (Signed)
Left the patient a VM requesting a call back to schedule his Medicare AWV

## 2021-04-15 NOTE — Telephone Encounter (Signed)
Left VM requesting he call back to schedule Medicare AWV

## 2021-04-16 ENCOUNTER — Telehealth: Payer: Self-pay

## 2021-04-16 NOTE — Telephone Encounter (Signed)
Lmom asking pt to call back to schedule AWV

## 2021-04-16 NOTE — Telephone Encounter (Signed)
Copied from [place] [id number]. Topic: Appointment Scheduling - Scheduling Inquiry for Clinic >> Apr 15, 2021  5:15 PM Yvette Rack wrote: Reason for CRM: Pt returned call to schedule Medicare AWV appt   Called and LVM for patient to please return my call to schedule AWV.

## 2021-04-20 ENCOUNTER — Ambulatory Visit (INDEPENDENT_AMBULATORY_CARE_PROVIDER_SITE_OTHER): Payer: Medicare Other

## 2021-04-20 VITALS — Ht 71.0 in | Wt 215.0 lb

## 2021-04-20 DIAGNOSIS — Z122 Encounter for screening for malignant neoplasm of respiratory organs: Secondary | ICD-10-CM | POA: Diagnosis not present

## 2021-04-20 DIAGNOSIS — F17200 Nicotine dependence, unspecified, uncomplicated: Secondary | ICD-10-CM | POA: Diagnosis not present

## 2021-04-20 DIAGNOSIS — Z0000 Encounter for general adult medical examination without abnormal findings: Secondary | ICD-10-CM

## 2021-04-20 NOTE — Patient Instructions (Signed)
[name] , Thank you for taking time to come for your Medicare Wellness Visit. I appreciate your ongoing commitment to your health goals. Please review the following plan we discussed and let me know if I can assist you in the future.   Screening recommendations/referrals: Colonoscopy: completed 09/21/2017 Recommended yearly ophthalmology/optometry visit for glaucoma screening and checkup Recommended yearly dental visit for hygiene and checkup  Vaccinations: Influenza vaccine: due Pneumococcal vaccine: completed 07/17/2015 Tdap vaccine: completed 07/17/2015, due 07/16/2025 Shingles vaccine: discussed   Covid-19:  04/13/2021, 11/24/2020, 05/13/2020, 08/17/2019, 07/19/2019  Advanced directives: Please bring a copy of your POA (Power of Attorney) and/or Living Will to your next appointment.   Conditions/risks identified: smoking  Next appointment: Follow up in one year for your annual wellness visit.   Preventive Care 73 Years and Older, Male Preventive care refers to lifestyle choices and visits with your health care provider that can promote health and wellness. What does preventive care include? A yearly physical exam. This is also called an annual well check. Dental exams once or twice a year. Routine eye exams. Ask your health care provider how often you should have your eyes checked. Personal lifestyle choices, including: Daily care of your teeth and gums. Regular physical activity. Eating a healthy diet. Avoiding tobacco and drug use. Limiting alcohol use. Practicing safe sex. Taking low doses of aspirin every day. Taking vitamin and mineral supplements as recommended by your health care provider. What happens during an annual well check? The services and screenings done by your health care provider during your annual well check will depend on your age, overall health, lifestyle risk factors, and family history of disease. Counseling  Your health care provider may ask you  questions about your: Alcohol use. Tobacco use. Drug use. Emotional well-being. Home and relationship well-being. Sexual activity. Eating habits. History of falls. Memory and ability to understand (cognition). Work and work Statistician. Screening  You may have the following tests or measurements: Height, weight, and BMI. Blood pressure. Lipid and cholesterol levels. These may be checked every 5 years, or more frequently if you are over 34 years old. Skin check. Lung cancer screening. You may have this screening every year starting at age 73 if you have a 30-pack-year history of smoking and currently smoke or have quit within the past 73 years. Fecal occult blood test (FOBT) of the stool. You may have this test every year starting at age 73. Flexible sigmoidoscopy or colonoscopy. You may have a sigmoidoscopy every 5 years or a colonoscopy every 10 years starting at age 73. Prostate cancer screening. Recommendations will vary depending on your family history and other risks. Hepatitis C blood test. Hepatitis B blood test. Sexually transmitted disease (STD) testing. Diabetes screening. This is done by checking your blood sugar (glucose) after you have not eaten for a while (fasting). You may have this done every 1-3 years. Abdominal aortic aneurysm (AAA) screening. You may need this if you are a current or former smoker. Osteoporosis. You may be screened starting at age 73 if you are at high risk. Talk with your health care provider about your test results, treatment options, and if necessary, the need for more tests. Vaccines  Your health care provider may recommend certain vaccines, such as: Influenza vaccine. This is recommended every year. Tetanus, diphtheria, and acellular pertussis (Tdap, Td) vaccine. You may need a Td booster every 10 years. Zoster vaccine. You may need this after age 73. Pneumococcal 13-valent conjugate (PCV13) vaccine. One dose is  recommended after age  73. Pneumococcal polysaccharide (PPSV23) vaccine. One dose is recommended after age 10. Talk to your health care provider about which screenings and vaccines you need and how often you need them. This information is not intended to replace advice given to you by your health care provider. Make sure you discuss any questions you have with your health care provider. Document Released: 08/01/2015 Document Revised: 03/24/2016 Document Reviewed: 05/06/2015 Elsevier Interactive Patient Education  2017 [employer] Prevention in the Home Falls can cause injuries. They can happen to people of all ages. There are many things you can do to make your home safe and to help prevent falls. What can I do on the outside of my home? Regularly fix the edges of walkways and driveways and fix any cracks. Remove anything that might make you trip as you walk through a door, such as a raised step or threshold. Trim any bushes or trees on the path to your home. Use bright outdoor lighting. Clear any walking paths of anything that might make someone trip, such as rocks or tools. Regularly check to see if handrails are loose or broken. Make sure that both sides of any steps have handrails. Any raised decks and porches should have guardrails on the edges. Have any leaves, snow, or ice cleared regularly. Use sand or salt on walking paths during winter. Clean up any spills in your garage right away. This includes oil or grease spills. What can I do in the bathroom? Use night lights. Install grab bars by the toilet and in the tub and shower. Do not use towel bars as grab bars. Use non-skid mats or decals in the tub or shower. If you need to sit down in the shower, use a plastic, non-slip stool. Keep the floor dry. Clean up any water that spills on the floor as soon as it happens. Remove soap buildup in the tub or shower regularly. Attach bath mats securely with double-sided non-slip rug tape. Do not have throw  rugs and other things on the floor that can make you trip. What can I do in the bedroom? Use night lights. Make sure that you have a light by your bed that is easy to reach. Do not use any sheets or blankets that are too big for your bed. They should not hang down onto the floor. Have a firm chair that has side arms. You can use this for support while you get dressed. Do not have throw rugs and other things on the floor that can make you trip. What can I do in the kitchen? Clean up any spills right away. Avoid walking on wet floors. Keep items that you use a lot in easy-to-reach places. If you need to reach something above you, use a strong step stool that has a grab bar. Keep electrical cords out of the way. Do not use floor polish or wax that makes floors slippery. If you must use wax, use non-skid floor wax. Do not have throw rugs and other things on the floor that can make you trip. What can I do with my stairs? Do not leave any items on the stairs. Make sure that there are handrails on both sides of the stairs and use them. Fix handrails that are broken or loose. Make sure that handrails are as long as the stairways. Check any carpeting to make sure that it is firmly attached to the stairs. Fix any carpet that is loose or worn. Avoid having  throw rugs at the top or bottom of the stairs. If you do have throw rugs, attach them to the floor with carpet tape. Make sure that you have a light switch at the top of the stairs and the bottom of the stairs. If you do not have them, ask someone to add them for you. What else can I do to help prevent falls? Wear shoes that: Do not have high heels. Have rubber bottoms. Are comfortable and fit you well. Are closed at the toe. Do not wear sandals. If you use a stepladder: Make sure that it is fully opened. Do not climb a closed stepladder. Make sure that both sides of the stepladder are locked into place. Ask someone to hold it for you, if  possible. Clearly mark and make sure that you can see: Any grab bars or handrails. First and last steps. Where the edge of each step is. Use tools that help you move around (mobility aids) if they are needed. These include: Canes. Walkers. Scooters. Crutches. Turn on the lights when you go into a dark area. Replace any light bulbs as soon as they burn out. Set up your furniture so you have a clear path. Avoid moving your furniture around. If any of your floors are uneven, fix them. If there are any pets around you, be aware of where they are. Review your medicines with your doctor. Some medicines can make you feel dizzy. This can increase your chance of falling. Ask your doctor what other things that you can do to help prevent falls. This information is not intended to replace advice given to you by your health care provider. Make sure you discuss any questions you have with your health care provider. Document Released: 05/01/2009 Document Revised: 12/11/2015 Document Reviewed: 08/09/2014 Elsevier Interactive Patient Education  2017 [employer].

## 2021-04-20 NOTE — Progress Notes (Signed)
I connected with Nathan Cooper today by telephone and verified that I am speaking with the correct person using two identifiers. Location patient: home Location provider: work Persons participating in the virtual visit: [name], [name].   I discussed the limitations, risks, security and privacy concerns of performing an evaluation and management service by telephone and the availability of in person appointments. I also discussed with the patient that there may be a patient responsible charge related to this service. The patient expressed understanding and verbally consented to this telephonic visit.    Interactive audio and video telecommunications were attempted between this provider and patient, however failed, due to patient having technical difficulties OR patient did not have access to video capability.  We continued and completed visit with audio only.     Vital signs may be patient reported or missing.  Subjective:   Nathan Cooper is a 75 y.o. male who presents for Medicare Annual/Subsequent preventive examination.  Review of Systems     Cardiac Risk Factors include: advanced age (>48men, >29 women);hypertension;male gender;smoking/ tobacco exposure;sedentary lifestyle     Objective:    Today's Vitals   04/20/21 1526  Weight: 215 lb (97.5 kg)  Height: 5\' 11"  (1.803 m)   Body mass index is 29.99 kg/m.  Advanced Directives 04/20/2021 08/28/2018 09/21/2017 08/24/2017 08/17/2016 07/17/2015  Does Patient Have a Medical Advance Directive? Yes Yes Yes Yes Yes Yes  Type of Paramedic of [place];Living will Living will;Healthcare Power of [place];Living will [place];Living will [place];Living will Living will;Healthcare Power of Attorney  Does patient want to make changes to medical advance directive? - - - - Yes (MAU/Ambulatory/Procedural Areas - Information  given) No - Patient declined  Copy of [place] in Chart? No - copy requested No - copy requested No - copy requested No - copy requested No - copy requested No - copy requested    Current Medications (verified) Outpatient Encounter Medications as of 04/20/2021  Medication Sig   amLODipine (NORVASC) 10 MG tablet Take 1 tablet (10 mg total) by mouth daily.   aspirin 81 MG EC tablet TAKE 1 TABLET BY MOUTH EVERY DAY   benazepril (LOTENSIN) 40 MG tablet TAKE 1 TABLET BY MOUTH EVERY DAY   sildenafil (REVATIO) 20 MG tablet Take 1-5 tabs 30 minutes prior to intercourse   triamcinolone ointment (KENALOG) 0.5 % APPLY TO AFFECTED AREA TWICE A DAY   valACYclovir (VALTREX) 500 MG tablet Take 1 tablet (500 mg total) by mouth 2 (two) times daily as needed.   umeclidinium-vilanterol (ANORO ELLIPTA) 62.5-25 MCG/INH AEPB Inhale 1 puff into the lungs daily. (Patient not taking: No sig reported)   No facility-administered encounter medications on file as of 04/20/2021.    Allergies (verified) Patient has no known allergies.   History: Past Medical History:  Diagnosis Date   COPD (chronic obstructive pulmonary disease) ([place])    Coronary artery disease    Hepatitis C    Hernia, inguinal 2014   Hypertension 2009   IFG (impaired fasting glucose) 07/22/2015   Personal history of tobacco use, presenting hazards to health 08/26/2015   Sleep apnea    Past Surgical History:  Procedure Laterality Date   COLONOSCOPY  2010   COLONOSCOPY WITH PROPOFOL N/A 09/21/2017   Procedure: COLONOSCOPY WITH PROPOFOL;  Surgeon: Virgel Manifold, MD;  Location: [facility] ENDOSCOPY;  Service: Endoscopy;  Laterality: N/A;   HERNIA REPAIR  2014  Family History  Problem Relation Age of Onset   Hypertension Mother    Cirrhosis Father    Gout Son    Heart disease Maternal Uncle 30   Heart attack Maternal Uncle    Cancer Paternal Uncle    Diabetes Paternal Grandmother    Social History   Socioeconomic  History   Marital status: Divorced    Spouse name: Not on file   Number of children: Not on file   Years of education: Not on file   Highest education level: Associate degree: academic program  Occupational History   Not on file  Tobacco Use   Smoking status: Every Day    Packs/day: 1.00    Years: 47.50    Pack years: 47.50    Types: Cigarettes   Smokeless tobacco: Never   Tobacco comments:    has had 5 since jan 2019  Vaping Use   Vaping Use: Some days   Start date: 08/28/2017   Devices: jewel  Substance and Sexual Activity   Alcohol use: No    Alcohol/week: 0.0 standard drinks   Drug use: No   Sexual activity: Yes  Other Topics Concern   Not on file  Social History Narrative   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   Owns legal share company   Peer support    34 year old daughter lives with him    Social Determinants of Health   Financial Resource Strain: Low Risk    Difficulty of Paying Living Expenses: Not hard at all  Food Insecurity: No Food Insecurity   Worried About Charity fundraiser in the Last Year: Never true   Arboriculturist in the Last Year: Never true  Transportation Needs: No Transportation Needs   Lack of Transportation (Medical): No   Lack of Transportation (Non-Medical): No  Physical Activity: Inactive   Days of Exercise per Week: 0 days   Minutes of Exercise per Session: 0 min  Stress: No Stress Concern Present   Feeling of Stress : Not at all  Social Connections: Not on file    Tobacco Counseling Ready to quit: Not Answered Counseling given: Not Answered Tobacco comments: has had 5 since jan 2019   Clinical Intake:  Pre-visit preparation completed: Yes  Pain : No/denies pain     Nutritional Status: BMI 25 -29 Overweight Nutritional Risks: None Diabetes: No  How often do you need to have someone help you when you read instructions, pamphlets, or other written materials from your doctor or pharmacy?: 1 - Never What is the last grade level you  completed in school?: some college  Diabetic? no  Interpreter Needed?: No      Activities of Daily Living In your present state of health, do you have any difficulty performing the following activities: 04/20/2021 09/11/2020  Hearing? N N  Vision? N N  Difficulty concentrating or making decisions? N N  Walking or climbing stairs? Y N  Comment SOB -  Dressing or bathing? N N  Doing errands, shopping? N N  Preparing Food and eating ? N -  Using the Toilet? N -  In the past six months, have you accidently leaked urine? N -  Do you have problems with loss of bowel control? N -  Managing your Medications? N -  Managing your Finances? N -  Housekeeping or managing your Housekeeping? N -  Some recent data might be hidden    Patient Care Team: Valerie Roys, DO as PCP -  General (Family Medicine)  Indicate any recent Medical Services you may have received from other than [facility] providers in the past year (date may be approximate).     Assessment:   This is a routine wellness examination for [place].  Hearing/Vision screen Vision Screening - Comments:: No regular eye exams,  Dietary issues and exercise activities discussed: Current Exercise Habits: The patient does not participate in regular exercise at present   Goals Addressed             This Visit's Progress    Patient Stated       04/20/2021, stay healthy       Depression Screen PHQ 2/9 Scores 04/20/2021 09/11/2020 05/15/2020 08/28/2018 08/24/2017 08/17/2016 07/20/2015  PHQ - 2 Score 0 0 0 0 0 0 0  PHQ- 9 Score - - 0 - - - 2    Fall Risk Fall Risk  04/20/2021 09/11/2020 05/15/2020 09/03/2019 08/08/2019  Falls in the past year? 0 0 0 0 0  Number falls in past yr: - 0 0 0 0  Injury with Fall? - 0 0 0 0  Risk for fall due to : Medication side effect Orthopedic patient No Fall Risks - -  Follow up Falls evaluation completed;Education provided;Falls prevention discussed Falls evaluation completed Falls evaluation completed -  -    FALL RISK PREVENTION PERTAINING TO THE HOME:  Any stairs in or around the home? No  If so, are there any without handrails? N/a Home free of loose throw rugs in walkways, pet beds, electrical cords, etc? Yes  Adequate lighting in your home to reduce risk of falls? Yes   ASSISTIVE DEVICES UTILIZED TO PREVENT FALLS:  Life alert? No  Use of a cane, walker or w/c? No  Grab bars in the bathroom? No  Shower chair or bench in shower? No  Elevated toilet seat or a handicapped toilet? No   TIMED UP AND GO:  Was the test performed? No .       Cognitive Function:     6CIT Screen 04/20/2021 09/03/2019 08/28/2018 08/24/2017 08/17/2016  What Year? 0 points 0 points 0 points 0 points 0 points  What month? 0 points 0 points 0 points 0 points 0 points  What time? 0 points 0 points 0 points 0 points 0 points  Count back from 20 0 points 0 points 0 points 0 points 0 points  Months in reverse 0 points 0 points 0 points 0 points 0 points  Repeat phrase 0 points 0 points 0 points 0 points 2 points  Total Score 0 0 0 0 2    Immunizations Immunization History  Administered Date(s) Administered   Fluad Quad(high Dose 65+) 04/30/2019, 05/26/2020   Hepatitis A, Adult 08/14/2015, 02/11/2016   Hepatitis B, adult 07/17/2015, 08/14/2015, 02/11/2016   Influenza, High Dose Seasonal PF 05/07/2016, 08/28/2018   Influenza,inj,Quad PF,6+ Mos 07/17/2015   Influenza-Unspecified 05/16/2014, 05/24/2017   Moderna Sars-Covid-2 Vaccination 07/19/2019, 08/17/2019, 05/13/2020, 11/24/2020, 04/13/2021   Pneumococcal Conjugate-13 05/16/2014   Pneumococcal Polysaccharide-23 07/17/2015   Td 01/23/2003, 07/17/2015    TDAP status: Up to date  Flu Vaccine status: Due, Education has been provided regarding the importance of this vaccine. Advised may receive this vaccine at local pharmacy or Health Dept. Aware to provide a copy of the vaccination record if obtained from local pharmacy or Health Dept. Verbalized  acceptance and understanding.  Pneumococcal vaccine status: Up to date  Covid-19 vaccine status: Completed vaccines  Qualifies for Shingles Vaccine? Yes  Zostavax completed No   Shingrix Completed?: No.    Education has been provided regarding the importance of this vaccine. Patient has been advised to call insurance company to determine out of pocket expense if they have not yet received this vaccine. Advised may also receive vaccine at local pharmacy or Health Dept. Verbalized acceptance and understanding.  Screening Tests Health Maintenance  Topic Date Due   Zoster Vaccines- Shingrix (1 of 2) Never done   COLONOSCOPY (Pts 45-6yrs Insurance coverage will need to be confirmed)  09/21/2020   INFLUENZA VACCINE  02/16/2021   TETANUS/TDAP  07/16/2025   COVID-19 Vaccine  Completed   Hepatitis C Screening  Completed   HPV VACCINES  Aged Out    Health Maintenance  Health Maintenance Due  Topic Date Due   Zoster Vaccines- Shingrix (1 of 2) Never done   COLONOSCOPY (Pts 45-40yrs Insurance coverage will need to be confirmed)  09/21/2020   INFLUENZA VACCINE  02/16/2021    Colorectal cancer screening: Type of screening: Colonoscopy. Completed 10/01/2017. Repeat every 3 years  Lung Cancer Screening: (Low Dose CT Chest recommended if Age 60-80 years, 30 pack-year currently smoking OR have quit w/in 15years.) does qualify.   Lung Cancer Screening Referral: will put in referral  Additional Screening:  Hepatitis C Screening: does qualify; Completed 09/11/2020  Vision Screening: Recommended annual ophthalmology exams for early detection of glaucoma and other disorders of the eye. Is the patient up to date with their annual eye exam?  No  Who is the provider or what is the name of the office in which the patient attends annual eye exams? none If pt is not established with a provider, would they like to be referred to a provider to establish care? No .   Dental Screening: Recommended  annual dental exams for proper oral hygiene  Community Resource Referral / Chronic Care Management: CRR required this visit?  No   CCM required this visit?  No      Plan:     I have personally reviewed and noted the following in the patient's chart:   Medical and social history Use of alcohol, tobacco or illicit drugs  Current medications and supplements including opioid prescriptions. Patient is not currently taking opioid prescriptions. Functional ability and status Nutritional status Physical activity Advanced directives List of other physicians Hospitalizations, surgeries, and ER visits in previous 12 months Vitals Screenings to include cognitive, depression, and falls Referrals and appointments  In addition, I have reviewed and discussed with patient certain preventive protocols, quality metrics, and best practice recommendations. A written personalized care plan for preventive services as well as general preventive health recommendations were provided to patient.     Kellie Simmering, LPN   [id number]/01/[id number]   Nurse Notes:

## 2021-04-20 NOTE — Telephone Encounter (Signed)
Pt is scheduled for appt 04/22/2021.

## 2021-04-21 ENCOUNTER — Telehealth: Payer: Self-pay

## 2021-04-21 NOTE — Telephone Encounter (Signed)
Copied from [place] [id number]. Topic: Appointment Scheduling - Scheduling Inquiry for Clinic >> Apr 20, 2021  3:27 PM Erick Blinks wrote: Reason for CRM: Pt returned missed AWV, was on the phone with the courthouse when he was called. Please advise Best contact: [phone]

## 2021-04-22 ENCOUNTER — Encounter: Payer: Self-pay | Admitting: Family Medicine

## 2021-04-22 ENCOUNTER — Other Ambulatory Visit: Payer: Self-pay

## 2021-04-22 ENCOUNTER — Ambulatory Visit (INDEPENDENT_AMBULATORY_CARE_PROVIDER_SITE_OTHER): Payer: Medicare Other | Admitting: Family Medicine

## 2021-04-22 VITALS — BP 140/60 | HR 84 | Temp 98.6°F | Ht 70.98 in | Wt 217.2 lb

## 2021-04-22 DIAGNOSIS — N529 Male erectile dysfunction, unspecified: Secondary | ICD-10-CM

## 2021-04-22 DIAGNOSIS — I10 Essential (primary) hypertension: Secondary | ICD-10-CM

## 2021-04-22 DIAGNOSIS — I25118 Atherosclerotic heart disease of native coronary artery with other forms of angina pectoris: Secondary | ICD-10-CM | POA: Diagnosis not present

## 2021-04-22 DIAGNOSIS — Z1211 Encounter for screening for malignant neoplasm of colon: Secondary | ICD-10-CM | POA: Diagnosis not present

## 2021-04-22 DIAGNOSIS — Z87442 Personal history of urinary calculi: Secondary | ICD-10-CM

## 2021-04-22 DIAGNOSIS — Z23 Encounter for immunization: Secondary | ICD-10-CM | POA: Diagnosis not present

## 2021-04-22 DIAGNOSIS — R310 Gross hematuria: Secondary | ICD-10-CM

## 2021-04-22 LAB — URINALYSIS, ROUTINE W REFLEX MICROSCOPIC
Bilirubin, UA: NEGATIVE
Glucose, UA: NEGATIVE
Leukocytes,UA: NEGATIVE
Nitrite, UA: NEGATIVE
Specific Gravity, UA: 1.025 (ref 1.005–1.030)
Urobilinogen, Ur: 2 mg/dL — ABNORMAL HIGH (ref 0.2–1.0)
pH, UA: 5.5 (ref 5.0–7.5)

## 2021-04-22 LAB — MICROSCOPIC EXAMINATION
Bacteria, UA: NONE SEEN
Epithelial Cells (non renal): NONE SEEN /hpf (ref 0–10)
WBC, UA: NONE SEEN /hpf (ref 0–5)

## 2021-04-22 MED ORDER — VALACYCLOVIR HCL 500 MG PO TABS: 500.0000 mg | ORAL_TABLET | Freq: Two times a day (BID) | ORAL | 5 refills | Status: DC | PRN

## 2021-04-22 MED ORDER — AMLODIPINE BESYLATE 10 MG PO TABS: 10.0000 mg | ORAL_TABLET | Freq: Every day | ORAL | 1 refills | Status: DC

## 2021-04-22 MED ORDER — ASPIRIN 81 MG PO TBEC: 81.0000 mg | DELAYED_RELEASE_TABLET | Freq: Every day | ORAL | 3 refills | Status: DC

## 2021-04-22 MED ORDER — BENAZEPRIL HCL 40 MG PO TABS: 40.0000 mg | ORAL_TABLET | Freq: Every day | ORAL | 1 refills | Status: DC

## 2021-04-22 MED ORDER — SILDENAFIL CITRATE 20 MG PO TABS: ORAL_TABLET | ORAL | 4 refills | Status: DC

## 2021-04-22 NOTE — Assessment & Plan Note (Signed)
Will keep BP and cholesterol under good control. Continue to monitor. Call with any concerns.  

## 2021-04-22 NOTE — Progress Notes (Signed)
BP 140/60   Pulse 84   Temp 98.6 F (37 C) (Oral)   Ht 5' 10.98" (1.803 m)   Wt 217 lb 3.2 oz (98.5 kg)   SpO2 97%   BMI 30.31 kg/m    Subjective:    Patient ID: Nathan Cooper, male    DOB: 1946-02-15, 75 y.o.   MRN: [id number]  HPI: Nathan Cooper is a 75 y.o. male  Chief Complaint  Patient presents with   Hypertension   Nephrolithiasis    Patient believes he passed a kidney stone about 1 1/2 weeks ago, did have blood in urine for a day or 2 but now has gone. Patient states that he is still having some pain in the left groin.   HYPERTENSION Hypertension status: controlled  Satisfied with current treatment? no Duration of hypertension: chronic BP monitoring frequency:  not checking BP range:  BP medication side effects:  no Medication compliance: excellent compliance Previous BP meds: benazepril, amlodipine Aspirin: yes Recurrent headaches: no Visual changes: no Palpitations: no Dyspnea: no Chest pain: no Lower extremity edema: no Dizzy/lightheaded: no  URINARY SYMPTOMS Duration: about a month until about 10 days ago Dysuria: yes Urinary frequency: yes Urgency: yes Small volume voids: yes Symptom severity: moderate Urinary incontinence: no Foul odor: no Hematuria: yes Abdominal pain: yes Back pain: no Suprapubic pain/pressure: no Flank pain: yes Fever:  no Vomiting: no Relief with cranberry juice: no Relief with pyridium: no Status: better Previous urinary tract infection: no Recurrent urinary tract infection: no Sexual activity: practicing safe sex History of sexually transmitted disease: no Penile discharge: no Treatments attempted: increasing fluids    Relevant past medical, surgical, family and social history reviewed and updated as indicated. Interim medical history since our last visit reviewed. Allergies and medications reviewed and updated.  Review of Systems  Constitutional: Negative.   Respiratory: Negative.     Cardiovascular: Negative.   Genitourinary:  Positive for dysuria, frequency, hematuria and urgency. Negative for decreased urine volume, difficulty urinating, enuresis, flank pain, genital sores, penile discharge, penile pain, penile swelling, scrotal swelling and testicular pain.  Musculoskeletal: Negative.   Neurological: Negative.   Psychiatric/Behavioral: Negative.     Per HPI unless specifically indicated above     Objective:    BP 140/60   Pulse 84   Temp 98.6 F (37 C) (Oral)   Ht 5' 10.98" (1.803 m)   Wt 217 lb 3.2 oz (98.5 kg)   SpO2 97%   BMI 30.31 kg/m   Wt Readings from Last 3 Encounters:  04/22/21 217 lb 3.2 oz (98.5 kg)  04/20/21 215 lb (97.5 kg)  09/11/20 216 lb (98 kg)    Physical Exam Vitals and nursing note reviewed.  Constitutional:      General: He is not in acute distress.    Appearance: Normal appearance. He is not ill-appearing, toxic-appearing or diaphoretic.  HENT:     Head: Normocephalic and atraumatic.     Right Ear: External ear normal.     Left Ear: External ear normal.     Nose: Nose normal.     Mouth/Throat:     Mouth: Mucous membranes are moist.     Pharynx: Oropharynx is clear.  Eyes:     General: No scleral icterus.       Right eye: No discharge.        Left eye: No discharge.     Extraocular Movements: Extraocular movements intact.     Conjunctiva/sclera: Conjunctivae normal.  Pupils: Pupils are equal, round, and reactive to light.  Cardiovascular:     Rate and Rhythm: Normal rate and regular rhythm.     Pulses: Normal pulses.     Heart sounds: Normal heart sounds. No murmur heard.   No friction rub. No gallop.  Pulmonary:     Effort: Pulmonary effort is normal. No respiratory distress.     Breath sounds: Normal breath sounds. No stridor. No wheezing, rhonchi or rales.  Chest:     Chest wall: No tenderness.  Musculoskeletal:        General: Normal range of motion.     Cervical back: Normal range of motion and neck  supple.  Skin:    General: Skin is warm and dry.     Capillary Refill: Capillary refill takes less than 2 seconds.     Coloration: Skin is not jaundiced or pale.     Findings: No bruising, erythema, lesion or rash.  Neurological:     General: No focal deficit present.     Mental Status: He is alert and oriented to person, place, and time. Mental status is at baseline.  Psychiatric:        Mood and Affect: Mood normal.        Behavior: Behavior normal.        Thought Content: Thought content normal.        Judgment: Judgment normal.    Results for orders placed or performed in visit on 09/11/20  Comprehensive metabolic panel  Result Value Ref Range   Glucose 108 (H) 65 - 99 mg/dL   BUN 14 8 - 27 mg/dL   Creatinine, Ser 1.05 0.76 - 1.27 mg/dL   GFR calc non Af Amer 71 >59 mL/min/1.73   GFR calc Af Amer 82 >59 mL/min/1.73   BUN/Creatinine Ratio 13 10 - 24   Sodium 138 134 - 144 mmol/L   Potassium 4.4 3.5 - 5.2 mmol/L   Chloride 103 96 - 106 mmol/L   CO2 20 20 - 29 mmol/L   Calcium 8.8 8.6 - 10.2 mg/dL   Total Protein 7.2 6.0 - 8.5 g/dL   Albumin 4.7 3.7 - 4.7 g/dL   Globulin, Total 2.5 1.5 - 4.5 g/dL   Albumin/Globulin Ratio 1.9 1.2 - 2.2   Bilirubin Total 0.3 0.0 - 1.2 mg/dL   Alkaline Phosphatase 75 44 - 121 IU/L   AST 15 0 - 40 IU/L   ALT 11 0 - 44 IU/L  CBC with Differential/Platelet  Result Value Ref Range   WBC 8.3 3.4 - 10.8 x10E3/uL   RBC 4.50 4.14 - 5.80 x10E6/uL   Hemoglobin 14.0 13.0 - 17.7 g/dL   Hematocrit 40.8 37.5 - 51.0 %   MCV 91 79 - 97 fL   MCH 31.1 26.6 - 33.0 pg   MCHC 34.3 31.5 - 35.7 g/dL   RDW 13.3 11.6 - 15.4 %   Platelets 286 150 - 450 x10E3/uL   Neutrophils 68 Not Estab. %   Lymphs 17 Not Estab. %   Monocytes 12 Not Estab. %   Eos 2 Not Estab. %   Basos 1 Not Estab. %   Neutrophils Absolute 5.6 1.4 - 7.0 x10E3/uL   Lymphocytes Absolute 1.4 0.7 - 3.1 x10E3/uL   Monocytes Absolute 1.0 (H) 0.1 - 0.9 x10E3/uL   EOS (ABSOLUTE) 0.2 0.0 - 0.4  x10E3/uL   Basophils Absolute 0.0 0.0 - 0.2 x10E3/uL   Immature Granulocytes 0 Not Estab. %   Immature Grans (Abs)  0.0 0.0 - 0.1 x10E3/uL  Lipid Panel w/o Chol/HDL Ratio  Result Value Ref Range   Cholesterol, Total 153 100 - 199 mg/dL   Triglycerides 94 0 - 149 mg/dL   HDL 44 >39 mg/dL   VLDL Cholesterol Cal 18 5 - 40 mg/dL   LDL Chol Calc (NIH) 91 0 - 99 mg/dL  PSA  Result Value Ref Range   Prostate Specific Ag, Serum 2.3 0.0 - 4.0 ng/mL  TSH  Result Value Ref Range   TSH 0.706 0.450 - 4.500 uIU/mL  Urinalysis, Routine w reflex microscopic  Result Value Ref Range   Specific Gravity, UA 1.025 1.005 - 1.030   pH, UA 6.5 5.0 - 7.5   Color, UA Yellow Yellow   Appearance Ur Clear Clear   Leukocytes,UA Negative Negative   Protein,UA Negative Negative/Trace   Glucose, UA Negative Negative   Ketones, UA Trace (A) Negative   RBC, UA Negative Negative   Bilirubin, UA Negative Negative   Urobilinogen, Ur 4.0 (H) 0.2 - 1.0 mg/dL   Nitrite, UA Negative Negative  Microalbumin, Urine Waived  Result Value Ref Range   Microalb, Ur Waived 30 (H) 0 - 19 mg/L   Creatinine, Urine Waived 300 10 - 300 mg/dL   Microalb/Creat Ratio <30 <30 mg/g      Assessment & Plan:   Problem List Items Addressed This Visit       Cardiovascular and Mediastinum   HTN (hypertension) - Primary    Under good control on current regimen. Continue current regimen. Continue to monitor. Call with any concerns. Refills given. Labs drawn today.       Relevant Medications   amLODipine (NORVASC) 10 MG tablet   aspirin 81 MG EC tablet   benazepril (LOTENSIN) 40 MG tablet   sildenafil (REVATIO) 20 MG tablet   Other Relevant Orders   Basic metabolic panel   CAD (coronary artery disease)    Will keep BP and cholesterol under good control. Continue to monitor. Call with any concerns.       Relevant Medications   amLODipine (NORVASC) 10 MG tablet   aspirin 81 MG EC tablet   benazepril (LOTENSIN) 40 MG tablet    sildenafil (REVATIO) 20 MG tablet     Other   ED (erectile dysfunction)   Relevant Medications   sildenafil (REVATIO) 20 MG tablet   Other Visit Diagnoses     Gross hematuria       Concern for stone. No pain now. Will get him set up for CT and treat as needed.    Relevant Orders   CT RENAL STONE STUDY   History of kidney stones       Concern for stone. No pain now. Will get him set up for CT and treat as needed.    Relevant Orders   Urinalysis, Routine w reflex microscopic   CT RENAL STONE STUDY   Screening for colon cancer       Referral to GI placed today.   Relevant Orders   Ambulatory referral to Gastroenterology   Need for influenza vaccination       Flu shot given.    Relevant Orders   Flu Vaccine QUAD High Dose(Fluad)   Essential hypertension       Relevant Medications   amLODipine (NORVASC) 10 MG tablet   aspirin 81 MG EC tablet   benazepril (LOTENSIN) 40 MG tablet   sildenafil (REVATIO) 20 MG tablet        Follow up plan:  Return in about 6 months (around 10/21/2021), or physical.

## 2021-04-22 NOTE — Telephone Encounter (Signed)
Pt is scheduled for tomorrow 04/23/2021.

## 2021-04-22 NOTE — Assessment & Plan Note (Signed)
Under good control on current regimen. Continue current regimen. Continue to monitor. Call with any concerns. Refills given. Labs drawn today.   

## 2021-04-23 LAB — BASIC METABOLIC PANEL
BUN/Creatinine Ratio: 13 (ref 10–24)
BUN: 14 mg/dL (ref 8–27)
CO2: 21 mmol/L (ref 20–29)
Calcium: 9.1 mg/dL (ref 8.6–10.2)
Chloride: 102 mmol/L (ref 96–106)
Creatinine, Ser: 1.04 mg/dL (ref 0.76–1.27)
Glucose: 110 mg/dL — ABNORMAL HIGH (ref 70–99)
Potassium: 4 mmol/L (ref 3.5–5.2)
Sodium: 138 mmol/L (ref 134–144)
eGFR: 76 mL/min/{1.73_m2} (ref >59)

## 2021-04-30 ENCOUNTER — Other Ambulatory Visit: Payer: Self-pay

## 2021-04-30 ENCOUNTER — Ambulatory Visit
Admission: RE | Admit: 2021-04-30 | Discharge: 2021-04-30 | Disposition: A | Discharge status: Home / Self Care | Payer: Medicare Other | Source: Ambulatory Visit | Attending: Family Medicine | Admitting: Family Medicine

## 2021-04-30 DIAGNOSIS — Z87442 Personal history of urinary calculi: Secondary | ICD-10-CM

## 2021-04-30 DIAGNOSIS — R310 Gross hematuria: Secondary | ICD-10-CM

## 2021-04-30 DIAGNOSIS — R1031 Right lower quadrant pain: Secondary | ICD-10-CM | POA: Diagnosis not present

## 2021-04-30 DIAGNOSIS — I700 Atherosclerosis of aorta: Secondary | ICD-10-CM | POA: Diagnosis not present

## 2021-04-30 DIAGNOSIS — M1612 Unilateral primary osteoarthritis, left hip: Secondary | ICD-10-CM | POA: Diagnosis not present

## 2021-05-30 ENCOUNTER — Other Ambulatory Visit: Payer: Self-pay | Admitting: Family Medicine

## 2021-05-30 NOTE — Telephone Encounter (Signed)
Requested Prescriptions  Pending Prescriptions Disp Refills  . triamcinolone ointment (KENALOG) 0.5 % [Pharmacy Med Name: TRIAMCINOLONE 0.5% OINTMENT] 45 g 0    Sig: APPLY TO AFFECTED AREA TWICE A DAY     Dermatology:  Corticosteroids Passed - 05/30/2021 10:55 AM      Passed - Valid encounter within last 12 months    Recent Outpatient Visits          1 month ago Primary hypertension   [place], [name], DO   8 months ago Routine general medical examination at a health care facility   [facility], [place] P, DO   1 year ago Chronic obstructive pulmonary disease, unspecified COPD type [facility])   [place], [name], DO   1 year ago Essential hypertension   [place], [place], DO   1 year ago Routine general medical examination at a health care facility   [place], Barb Merino, DO      Future Appointments            In 4 months Wynetta Emery, Barb Merino, DO [facility]

## 2021-06-05 ENCOUNTER — Other Ambulatory Visit: Payer: Self-pay

## 2021-06-05 DIAGNOSIS — F17210 Nicotine dependence, cigarettes, uncomplicated: Secondary | ICD-10-CM

## 2021-06-05 DIAGNOSIS — Z87891 Personal history of nicotine dependence: Secondary | ICD-10-CM

## 2021-07-30 ENCOUNTER — Ambulatory Visit
Admission: RE | Admit: 2021-07-30 | Discharge: 2021-07-30 | Disposition: A | Discharge status: Home / Self Care | Payer: Medicare Other | Source: Ambulatory Visit | Attending: Acute Care | Admitting: Acute Care

## 2021-07-30 ENCOUNTER — Other Ambulatory Visit: Payer: Self-pay

## 2021-07-30 DIAGNOSIS — F17210 Nicotine dependence, cigarettes, uncomplicated: Secondary | ICD-10-CM | POA: Insufficient documentation

## 2021-07-30 DIAGNOSIS — Z87891 Personal history of nicotine dependence: Secondary | ICD-10-CM | POA: Diagnosis not present

## 2021-07-31 ENCOUNTER — Other Ambulatory Visit: Payer: Self-pay | Admitting: Acute Care

## 2021-07-31 DIAGNOSIS — F17210 Nicotine dependence, cigarettes, uncomplicated: Secondary | ICD-10-CM

## 2021-07-31 DIAGNOSIS — Z87891 Personal history of nicotine dependence: Secondary | ICD-10-CM

## 2021-10-20 ENCOUNTER — Encounter: Payer: Self-pay | Admitting: Pharmacist

## 2021-10-20 NOTE — Progress Notes (Deleted)
[facility] [facility])  ?                                          [facility] Quality Pharmacy Team  ?                                      Statin Quality Measure Assessment ?  ? ?10/20/2021 ? ?Nathan Cooper ?06/11/1946 ?[id number] ? ?Per review of chart and payor information, patient has a diagnosis of diabetes but is not currently filling a statin prescription.  This places patient into the SUPD (Statin Use In Patients with Diabetes) measure for CMS.   ? ?I could not find any documentation of previous trial of a statin or a history of statin intolerance.  ? ?The 10-year ASCVD risk score (Arnett DK, et al., 2019) is: 36.9% ?  Values used to calculate the score: ?    Age: 76 years ?    Sex: Male ?    Is Non-Hispanic African American: Yes ?    Diabetic: No ?    Tobacco smoker: Yes ?    Systolic Blood Pressure: 845 mmHg ?    Is BP treated: Yes ?    HDL Cholesterol: 44 mg/dL ?    Total Cholesterol: 153 mg/dL ?09/11/2020 ? ?   ?Component Value Date/Time  ? CHOL 153 09/11/2020 1013  ? TRIG 94 09/11/2020 1013  ? HDL 44 09/11/2020 1013  ? [place] 91 09/11/2020 1013  ? ? ?Please consider ONE of the following recommendations:  ?Initiate high intensity statin Atorvastatin '40mg'$  once daily, #90, 3 refills  ? Rosuvastatin '20mg'$  once daily, #90, 3 refills  ?  ?Initiate moderate intensity  ?        statin with reduced frequency if prior  ?        statin intolerance 1x weekly, #13, 3 refills  ? 2x weekly, #26, 3 refills  ? 3x weekly, #39, 3 refills  ?  ?Code for past statin intolerance or  other exclusions (required annually) ? ?Provider Requirements: ?Associate code during an office visit or telehealth encounter  Drug Induced Myopathy G72.0  ? Myopathy, unspecified G72.9  ? Myositis, unspecified M60.9  ? Rhabdomyolysis M62.82  ? Cirrhosis of liver K74.69  ? Prediabetes R73.03  ? PCOS E28.2  ? ? ?Loretha Brasil, PharmD ?[facility] ?Clinical Pharmacist ?Office:  [phone] ? ? ? ? ?  ?

## 2021-10-20 NOTE — Progress Notes (Signed)
[place] [facility])  ?                                          [facility] Quality Pharmacy Team  ?                                      Statin Quality Measure Assessment ?  ? ?10/20/2021 ? ?Nathan Cooper ?September 15, 1945 ?[id number] ? ?Per review of chart and payor information, patient has a diagnosis of cardiovascular disease but is not currently filling a statin prescription.  This places patient into the [facility] (Statin Use In Patients with Cardiovascular Disease) measure for CMS.   ? ?I could not find any documentation of previous trial of a statin or a history of statin intolerance.  ? ?   ?Component Value Date/Time  ? CHOL 153 09/11/2020 1013  ? TRIG 94 09/11/2020 1013  ? HDL 44 09/11/2020 1013  ? [place] 91 09/11/2020 1013  ? ? ? ?Please consider ONE of the following recommendations:  ?Initiate high intensity statin Atorvastatin '40mg'$  once daily, #90, 3 refills  ? Rosuvastatin '20mg'$  once daily, #90, 3 refills  ?  ?Initiate moderate intensity  ?statin with reduced frequency if prior  ?statin intolerance 1x weekly, #13, 3 refills  ? 2x weekly, #26, 3 refills  ? 3x weekly, #39, 3 refills  ?  ?Code for past statin intolerance  ?(required annually) ? ?Provider Requirements: ?Must asociate code during an office visit or telehealth encounter  ? Drug Induced Myopathy G72.0  ? Myalgia M79.1  ? Myositis, unspecified M60.9  ? Myopathy, unspecified G72.9  ? Rhabdomyolysis M62.82  ? ? ? ?Loretha Brasil, PharmD ?[facility] ?Clinical Pharmacist ?Office: [phone] ? ?  ?

## 2021-10-21 ENCOUNTER — Encounter: Payer: Medicare Other | Admitting: Family Medicine

## 2021-11-12 ENCOUNTER — Other Ambulatory Visit: Payer: Self-pay | Admitting: Family Medicine

## 2021-11-12 DIAGNOSIS — I10 Essential (primary) hypertension: Secondary | ICD-10-CM

## 2021-11-12 NOTE — Telephone Encounter (Signed)
Attempted to call patient to schedule f/u appointment- left message to call office. Courtesy [id number]#[phone] given ?Requested Prescriptions  ?Pending Prescriptions Disp Refills  ?? amLODipine (NORVASC) 10 MG tablet [Pharmacy Med Name: AMLODIPINE BESYLATE 10 MG TAB] 90 tablet 1  ?  Sig: TAKE 1 TABLET BY MOUTH EVERY DAY  ?  ? Cardiovascular: Calcium Channel Blockers 2 Failed - 11/12/2021  3:09 AM  ?  ?  Failed - Last BP in normal range  ?  BP Readings from Last 1 Encounters:  ?04/22/21 140/60  ?   ?  ?  Failed - Valid encounter within last 6 months  ?  Recent Outpatient Visits   ?      ? 6 months ago Primary hypertension  ? [place], DO  ? 1 year ago Routine general medical examination at a health care facility  ? [place], [name], DO  ? 1 year ago Chronic obstructive pulmonary disease, unspecified COPD type ([place])  ? [place], [name], DO  ? 1 year ago Essential hypertension  ? [place], DO  ? 2 years ago Routine general medical examination at a health care facility  ? [place], [place] P, DO  ?  ?  ? ?  ?  ?  Passed - Last Heart Rate in normal range  ?  Pulse Readings from Last 1 Encounters:  ?04/22/21 84  ?   ?  ?  ? ?

## 2021-11-24 ENCOUNTER — Ambulatory Visit (INDEPENDENT_AMBULATORY_CARE_PROVIDER_SITE_OTHER): Payer: Medicare Other | Admitting: Family Medicine

## 2021-11-24 ENCOUNTER — Encounter: Payer: Self-pay | Admitting: Family Medicine

## 2021-11-24 VITALS — BP 135/70 | HR 89 | Temp 97.9°F | Wt 216.6 lb

## 2021-11-24 DIAGNOSIS — I700 Atherosclerosis of aorta: Secondary | ICD-10-CM | POA: Diagnosis not present

## 2021-11-24 DIAGNOSIS — Z0000 Encounter for general adult medical examination without abnormal findings: Secondary | ICD-10-CM

## 2021-11-24 DIAGNOSIS — I10 Essential (primary) hypertension: Secondary | ICD-10-CM | POA: Diagnosis not present

## 2021-11-24 DIAGNOSIS — J449 Chronic obstructive pulmonary disease, unspecified: Secondary | ICD-10-CM

## 2021-11-24 DIAGNOSIS — R3911 Hesitancy of micturition: Secondary | ICD-10-CM | POA: Diagnosis not present

## 2021-11-24 DIAGNOSIS — Z720 Tobacco use: Secondary | ICD-10-CM

## 2021-11-24 DIAGNOSIS — R310 Gross hematuria: Secondary | ICD-10-CM | POA: Diagnosis not present

## 2021-11-24 DIAGNOSIS — B182 Chronic viral hepatitis C: Secondary | ICD-10-CM

## 2021-11-24 DIAGNOSIS — I25118 Atherosclerotic heart disease of native coronary artery with other forms of angina pectoris: Secondary | ICD-10-CM

## 2021-11-24 DIAGNOSIS — N529 Male erectile dysfunction, unspecified: Secondary | ICD-10-CM

## 2021-11-24 LAB — URINALYSIS, ROUTINE W REFLEX MICROSCOPIC
Bilirubin, UA: NEGATIVE
Glucose, UA: NEGATIVE
Ketones, UA: NEGATIVE
Leukocytes,UA: NEGATIVE
Nitrite, UA: NEGATIVE
Protein,UA: NEGATIVE
Specific Gravity, UA: 1.015 (ref 1.005–1.030)
Urobilinogen, Ur: 1 mg/dL (ref 0.2–1.0)
pH, UA: 6 (ref 5.0–7.5)

## 2021-11-24 LAB — MICROSCOPIC EXAMINATION
Bacteria, UA: NONE SEEN
Epithelial Cells (non renal): NONE SEEN /hpf (ref 0–10)
WBC, UA: NONE SEEN /hpf (ref 0–5)

## 2021-11-24 LAB — MICROALBUMIN, URINE WAIVED
Creatinine, Urine Waived: 200 mg/dL (ref 10–300)
Microalb, Ur Waived: 10 mg/L (ref 0–19)
Microalb/Creat Ratio: <30 mg/g (ref <30)

## 2021-11-24 MED ORDER — AMLODIPINE BESYLATE 10 MG PO TABS: 10.0000 mg | ORAL_TABLET | Freq: Every day | ORAL | 1 refills | Status: DC

## 2021-11-24 MED ORDER — VALACYCLOVIR HCL 500 MG PO TABS: 500.0000 mg | ORAL_TABLET | Freq: Two times a day (BID) | ORAL | 5 refills | Status: DC | PRN

## 2021-11-24 MED ORDER — BENAZEPRIL HCL 40 MG PO TABS: 40.0000 mg | ORAL_TABLET | Freq: Every day | ORAL | 1 refills | Status: DC

## 2021-11-24 MED ORDER — SILDENAFIL CITRATE 20 MG PO TABS: ORAL_TABLET | ORAL | 4 refills | Status: DC

## 2021-11-24 NOTE — Assessment & Plan Note (Signed)
Continue CT chest. Continue to monitor.  ?

## 2021-11-24 NOTE — Progress Notes (Signed)
? ?BP 135/70   Pulse 89   Temp 97.9 ?F (36.6 ?C)   Wt 216 lb 9.6 oz (98.2 kg)   SpO2 96%   BMI 30.21 kg/m?   ? ?Subjective:  ? ? Patient ID: Nathan Cooper, male    DOB: 21-Jul-1945, 76 y.o.   MRN: [id number] ? ?HPI: ?Nathan Cooper is a 76 y.o. male presenting on 11/24/2021 for comprehensive medical examination. Current medical complaints include: ? ?HYPERTENSION ?Hypertension status: controlled  ?Satisfied with current treatment? yes ?Duration of hypertension: chronic ?BP monitoring frequency:  not checking ?BP medication side effects:  no ?Medication compliance: excellent compliance ?Previous BP meds:amlodipine, benazepril ?Aspirin: yes ?Recurrent headaches: no ?Visual changes: no ?Palpitations: no ?Dyspnea: no ?Chest pain: no ?Lower extremity edema: no ?Dizzy/lightheaded: no ? ?COPD ?COPD status: controlled ?Satisfied with current treatment?: yes ?Oxygen use: no ?Dyspnea frequency: occasionally ?Cough frequency: never ?Rescue inhaler frequency: never   ?Limitation of activity: no ?Productive cough:  ?Pneumovax: Up to Date ?Influenza: Up to Date ? ?Had some blood in his urine for 2-3 days about 2 months ago. No pain at that time.  ? ?Interim Problems from his last visit: no ? ?Depression Screen done today and results listed below:  ? ?  11/24/2021  ?  2:17 PM 04/22/2021  ? 10:51 AM 04/20/2021  ?  3:33 PM 09/11/2020  ? 10:04 AM 05/15/2020  ? 10:40 AM  ?Depression screen PHQ 2/9  ?Decreased Interest 0 0 0 0 0  ?Down, Depressed, Hopeless 0 0 0 0 0  ?PHQ - 2 Score 0 0 0 0 0  ?Altered sleeping 0    0  ?Tired, decreased energy 0    0  ?Change in appetite 0    0  ?Feeling bad or failure about yourself  0    0  ?Trouble concentrating 0    0  ?Moving slowly or fidgety/restless 0    0  ?Suicidal thoughts 0    0  ?PHQ-9 Score 0    0  ?Difficult doing work/chores Not difficult at all    Not difficult at all  ? ? ?Past Medical History:  ?Past Medical History:  ?Diagnosis Date  ? COPD (chronic obstructive pulmonary  disease) ([place])   ? Coronary artery disease   ? Hepatitis C   ? Hernia, inguinal 2014  ? Hypertension 2009  ? IFG (impaired fasting glucose) 07/22/2015  ? Personal history of tobacco use, presenting hazards to health 08/26/2015  ? Sleep apnea   ? ? ?Surgical History:  ?Past Surgical History:  ?Procedure Laterality Date  ? COLONOSCOPY  2010  ? COLONOSCOPY WITH PROPOFOL N/A 09/21/2017  ? Procedure: COLONOSCOPY WITH PROPOFOL;  Surgeon: Virgel Manifold, MD;  Location: [facility] ENDOSCOPY;  Service: Endoscopy;  Laterality: N/A;  ? HERNIA REPAIR  2014  ? ? ?Medications:  ?Current Outpatient Medications on File Prior to Visit  ?Medication Sig  ? aspirin 81 MG EC tablet Take 1 tablet (81 mg total) by mouth daily. Swallow whole.  ? triamcinolone ointment (KENALOG) 0.5 % APPLY TO AFFECTED AREA TWICE A DAY  ? ?No current facility-administered medications on file prior to visit.  ? ? ?Allergies:  ?No Known Allergies ? ?Social History:  ?Social History  ? ?Socioeconomic History  ? Marital status: Divorced  ?  Spouse name: Not on file  ? Number of children: Not on file  ? Years of education: Not on file  ? Highest education level: Associate degree: academic program  ?Occupational History  ?  Not on file  ?Tobacco Use  ? Smoking status: Every Day  ?  Packs/day: 1.00  ?  Years: 47.50  ?  Pack years: 47.50  ?  Types: Cigarettes  ? Smokeless tobacco: Never  ? Tobacco comments:  ?  has had 5 since jan 2019  ?Vaping Use  ? Vaping Use: Some days  ? Start date: 08/28/2017  ? Devices: jewel  ?Substance and Sexual Activity  ? Alcohol use: No  ?  Alcohol/week: 0.0 standard drinks  ? Drug use: Not Currently  ? Sexual activity: Yes  ?Other Topics Concern  ? Not on file  ?Social History Narrative  ? 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 Owns legal share company  ? Peer support   ? 64 year old daughter lives with him   ? ?Social Determinants of Health  ? ?Financial Resource Strain: Low Risk   ? Difficulty of Paying Living Expenses: Not hard at all  ?Food Insecurity: No  Food Insecurity  ? Worried About Charity fundraiser in the Last Year: Never true  ? Ran Out of Food in the Last Year: Never true  ?Transportation Needs: No Transportation Needs  ? Lack of Transportation (Medical): No  ? Lack of Transportation (Non-Medical): No  ?Physical Activity: Inactive  ? Days of Exercise per Week: 0 days  ? Minutes of Exercise per Session: 0 min  ?Stress: No Stress Concern Present  ? Feeling of Stress : Not at all  ?Social Connections: Not on file  ?Intimate Partner Violence: Not on file  ? ?Social History  ? ?Tobacco Use  ?Smoking Status Every Day  ? Packs/day: 1.00  ? Years: 47.50  ? Pack years: 47.50  ? Types: Cigarettes  ?Smokeless Tobacco Never  ?Tobacco Comments  ? has had 5 since jan 2019  ? ?Social History  ? ?Substance and Sexual Activity  ?Alcohol Use No  ? Alcohol/week: 0.0 standard drinks  ? ? ?Family History:  ?Family History  ?Problem Relation Age of Onset  ? Hypertension Mother   ? Cirrhosis Father   ? Gout Son   ? Heart disease Maternal Uncle 27  ? Heart attack Maternal Uncle   ? Cancer Paternal Uncle   ? Diabetes Paternal Grandmother   ? ? ?Past medical history, surgical history, medications, allergies, family history and social history reviewed with patient today and changes made to appropriate areas of the chart.  ? ?Review of Systems  ?Constitutional: Negative.   ?HENT: Negative.    ?Eyes: Negative.   ?Respiratory:  Positive for shortness of breath. Negative for cough, hemoptysis, sputum production and wheezing.   ?Cardiovascular: Negative.   ?Gastrointestinal: Negative.   ?Genitourinary:  Positive for hematuria and urgency. Negative for dysuria, flank pain and frequency.  ?Musculoskeletal: Negative.   ?Skin: Negative.   ?Neurological: Negative.   ?Endo/Heme/Allergies: Negative.   ?Psychiatric/Behavioral: Negative.    ?All other ROS negative except what is listed above and in the HPI.  ? ?   ?Objective:  ?  ?BP 135/70   Pulse 89   Temp 97.9 ?F (36.6 ?C)   Wt 216 lb 9.6  oz (98.2 kg)   SpO2 96%   BMI 30.21 kg/m?   ?Wt Readings from Last 3 Encounters:  ?11/24/21 216 lb 9.6 oz (98.2 kg)  ?07/30/21 215 lb (97.5 kg)  ?04/22/21 217 lb 3.2 oz (98.5 kg)  ?  ?Physical Exam ?Vitals and nursing note reviewed.  ?Constitutional:   ?   General: He is not in acute  distress. ?   Appearance: Normal appearance. He is obese. He is not ill-appearing, toxic-appearing or diaphoretic.  ?HENT:  ?   Head: Normocephalic and atraumatic.  ?   Right Ear: Tympanic membrane, ear canal and external ear normal. There is no impacted cerumen.  ?   Left Ear: Tympanic membrane, ear canal and external ear normal. There is no impacted cerumen.  ?   Nose: Nose normal. No congestion or rhinorrhea.  ?   Mouth/Throat:  ?   Mouth: Mucous membranes are moist.  ?   Pharynx: Oropharynx is clear. No oropharyngeal exudate or posterior oropharyngeal erythema.  ?Eyes:  ?   General: No scleral icterus.    ?   Right eye: No discharge.     ?   Left eye: No discharge.  ?   Extraocular Movements: Extraocular movements intact.  ?   Conjunctiva/sclera: Conjunctivae normal.  ?   Pupils: Pupils are equal, round, and reactive to light.  ?Neck:  ?   Vascular: No carotid bruit.  ?Cardiovascular:  ?   Rate and Rhythm: Normal rate and regular rhythm.  ?   Pulses: Normal pulses.  ?   Heart sounds: No murmur heard. ?  No friction rub. No gallop.  ?Pulmonary:  ?   Effort: Pulmonary effort is normal. No respiratory distress.  ?   Breath sounds: Normal breath sounds. No stridor. No wheezing, rhonchi or rales.  ?Chest:  ?   Chest wall: No tenderness.  ?Abdominal:  ?   General: Abdomen is flat. Bowel sounds are normal. There is no distension.  ?   Palpations: Abdomen is soft. There is no mass.  ?   Tenderness: There is no abdominal tenderness. There is no right CVA tenderness, left CVA tenderness, guarding or rebound.  ?   Hernia: No hernia is present.  ?Genitourinary: ?   Comments: Genital exam deferred with shared decision making ?Musculoskeletal:      ?   General: No swelling, tenderness, deformity or signs of injury. Normal range of motion.  ?   Cervical back: Normal range of motion and neck supple. No rigidity. No muscular tenderness.  ?   Right lower leg: N

## 2021-11-24 NOTE — Assessment & Plan Note (Signed)
Will keep BP and cholesterol under good control. Continue to monitor. Call with any concerns.  

## 2021-11-24 NOTE — Assessment & Plan Note (Signed)
Under good control on current regimen. Continue current regimen. Continue to monitor. Call with any concerns. Refills given.   

## 2021-11-24 NOTE — Assessment & Plan Note (Signed)
Rechecking labs today. Await results. Treat as needed.  °

## 2021-11-24 NOTE — Assessment & Plan Note (Signed)
Under good control on current regimen. Continue current regimen. Continue to monitor. Call with any concerns. Refills given. Labs drawn today.   

## 2021-11-24 NOTE — Assessment & Plan Note (Signed)
Doing well not on medication. Continue to monitor. Call with any concerns.   

## 2021-11-25 ENCOUNTER — Other Ambulatory Visit: Payer: Self-pay | Admitting: Family Medicine

## 2021-11-25 DIAGNOSIS — R972 Elevated prostate specific antigen [PSA]: Secondary | ICD-10-CM

## 2021-11-25 DIAGNOSIS — I700 Atherosclerosis of aorta: Secondary | ICD-10-CM

## 2021-11-26 LAB — LIPID PANEL W/O CHOL/HDL RATIO
Cholesterol, Total: 179 mg/dL (ref 100–199)
HDL: 47 mg/dL (ref >39)
LDL Chol Calc (NIH): 109 mg/dL — ABNORMAL HIGH (ref 0–99)
Triglycerides: 131 mg/dL (ref 0–149)
VLDL Cholesterol Cal: 23 mg/dL (ref 5–40)

## 2021-11-26 LAB — CBC WITH DIFFERENTIAL/PLATELET
Basophils Absolute: 0 10*3/uL (ref 0.0–0.2)
Basos: 0 %
EOS (ABSOLUTE): 0.1 10*3/uL (ref 0.0–0.4)
Eos: 1 %
Hematocrit: 42.3 % (ref 37.5–51.0)
Hemoglobin: 14.4 g/dL (ref 13.0–17.7)
Immature Grans (Abs): 0 10*3/uL (ref 0.0–0.1)
Immature Granulocytes: 0 %
Lymphocytes Absolute: 1.3 10*3/uL (ref 0.7–3.1)
Lymphs: 12 %
MCH: 31.4 pg (ref 26.6–33.0)
MCHC: 34 g/dL (ref 31.5–35.7)
MCV: 92 fL (ref 79–97)
Monocytes Absolute: 0.9 10*3/uL (ref 0.1–0.9)
Monocytes: 9 %
Neutrophils Absolute: 7.7 10*3/uL — ABNORMAL HIGH (ref 1.4–7.0)
Neutrophils: 78 %
Platelets: 273 10*3/uL (ref 150–450)
RBC: 4.59 x10E6/uL (ref 4.14–5.80)
RDW: 13.7 % (ref 11.6–15.4)
WBC: 10.1 10*3/uL (ref 3.4–10.8)

## 2021-11-26 LAB — COMPREHENSIVE METABOLIC PANEL
ALT: 13 IU/L (ref 0–44)
AST: 15 IU/L (ref 0–40)
Albumin/Globulin Ratio: 1.8 (ref 1.2–2.2)
Albumin: 4.8 g/dL — ABNORMAL HIGH (ref 3.7–4.7)
Alkaline Phosphatase: 69 IU/L (ref 44–121)
BUN/Creatinine Ratio: 12 (ref 10–24)
BUN: 13 mg/dL (ref 8–27)
Bilirubin Total: 0.4 mg/dL (ref 0.0–1.2)
CO2: 22 mmol/L (ref 20–29)
Calcium: 9.3 mg/dL (ref 8.6–10.2)
Chloride: 101 mmol/L (ref 96–106)
Creatinine, Ser: 1.13 mg/dL (ref 0.76–1.27)
Globulin, Total: 2.6 g/dL (ref 1.5–4.5)
Glucose: 105 mg/dL — ABNORMAL HIGH (ref 70–99)
Potassium: 4.2 mmol/L (ref 3.5–5.2)
Sodium: 138 mmol/L (ref 134–144)
Total Protein: 7.4 g/dL (ref 6.0–8.5)
eGFR: 69 mL/min/{1.73_m2} (ref >59)

## 2021-11-26 LAB — HCV RNA QUANT: Hepatitis C Quantitation: NOT DETECTED [IU]/mL

## 2021-11-26 LAB — TSH: TSH: 0.577 u[IU]/mL (ref 0.450–4.500)

## 2021-11-26 LAB — PSA: Prostate Specific Ag, Serum: 9.3 ng/mL — ABNORMAL HIGH (ref 0.0–4.0)

## 2021-11-26 MED ORDER — ROSUVASTATIN CALCIUM 5 MG PO TABS
5.0000 mg | ORAL_TABLET | ORAL | 0 refills | Status: DC
Start: 2021-11-26 — End: 2023-01-03

## 2021-11-26 NOTE — Addendum Note (Signed)
Addended by: Valerie Roys on: 11/26/2021 10:08 AM ? ? Modules accepted: Orders ? ?

## 2021-12-23 ENCOUNTER — Other Ambulatory Visit: Payer: Medicare Other

## 2021-12-23 DIAGNOSIS — I700 Atherosclerosis of aorta: Secondary | ICD-10-CM

## 2021-12-23 DIAGNOSIS — R972 Elevated prostate specific antigen [PSA]: Secondary | ICD-10-CM

## 2021-12-24 LAB — COMPREHENSIVE METABOLIC PANEL
ALT: 23 IU/L (ref 0–44)
AST: 22 IU/L (ref 0–40)
Albumin/Globulin Ratio: 1.5 (ref 1.2–2.2)
Albumin: 4.5 g/dL (ref 3.7–4.7)
Alkaline Phosphatase: 85 IU/L (ref 44–121)
BUN/Creatinine Ratio: 12 (ref 10–24)
BUN: 15 mg/dL (ref 8–27)
Bilirubin Total: 0.4 mg/dL (ref 0.0–1.2)
CO2: 21 mmol/L (ref 20–29)
Calcium: 9.2 mg/dL (ref 8.6–10.2)
Chloride: 99 mmol/L (ref 96–106)
Creatinine, Ser: 1.27 mg/dL (ref 0.76–1.27)
Globulin, Total: 3.1 g/dL (ref 1.5–4.5)
Glucose: 163 mg/dL — ABNORMAL HIGH (ref 70–99)
Potassium: 4.2 mmol/L (ref 3.5–5.2)
Sodium: 136 mmol/L (ref 134–144)
Total Protein: 7.6 g/dL (ref 6.0–8.5)
eGFR: 59 mL/min/{1.73_m2} — ABNORMAL LOW (ref >59)

## 2021-12-24 LAB — LIPID PANEL W/O CHOL/HDL RATIO
Cholesterol, Total: 129 mg/dL (ref 100–199)
HDL: 45 mg/dL (ref >39)
LDL Chol Calc (NIH): 71 mg/dL (ref 0–99)
Triglycerides: 60 mg/dL (ref 0–149)
VLDL Cholesterol Cal: 13 mg/dL (ref 5–40)

## 2021-12-24 LAB — PSA: Prostate Specific Ag, Serum: 1.3 ng/mL (ref 0.0–4.0)

## 2022-02-23 ENCOUNTER — Other Ambulatory Visit: Payer: Self-pay | Admitting: Family Medicine

## 2022-02-24 NOTE — Telephone Encounter (Signed)
Requested medication (s) are due for refill today: yes  Requested medication (s) are on the active medication list: yes  Last refill:  05/30/21 45 g  Future visit scheduled: yes  Notes to clinic:  med not delegated to NT to RF   Requested Prescriptions  Pending Prescriptions Disp Refills   triamcinolone ointment (KENALOG) 0.5 % [Pharmacy Med Name: TRIAMCINOLONE 0.5% OINTMENT] 45 g 0    Sig: APPLY TO AFFECTED AREA TWICE A DAY     Not Delegated - Dermatology:  Corticosteroids Failed - 02/23/2022  6:58 PM      Failed - This refill cannot be delegated      Passed - Valid encounter within last 12 months    Recent Outpatient Visits           3 months ago Routine general medical examination at a health care facility   [facility], [place], [place]   10 months ago Primary hypertension   [place], [place], DO   1 year ago Routine general medical examination at a health care facility   [facility], [place], DO   1 year ago Chronic obstructive pulmonary disease, unspecified COPD type [facility])   [place], [name], DO   1 year ago Essential hypertension   [facility], [place], DO       Future Appointments             In 3 months Johnson, Barb Merino, DO [employer], [facility]

## 2022-04-21 ENCOUNTER — Ambulatory Visit (INDEPENDENT_AMBULATORY_CARE_PROVIDER_SITE_OTHER): Payer: Medicare Other | Admitting: *Deleted

## 2022-04-21 DIAGNOSIS — Z0000 Encounter for general adult medical examination without abnormal findings: Secondary | ICD-10-CM | POA: Diagnosis not present

## 2022-04-21 NOTE — Progress Notes (Signed)
Subjective:   Nathan Cooper is a 76 y.o. male who presents for Medicare Annual/Subsequent preventive examination.  I connected with  [name] on 04/21/22 by a telephone enabled telemedicine application and verified that I am speaking with the correct person using two identifiers.   I discussed the limitations of evaluation and management by telemedicine. The patient expressed understanding and agreed to proceed.  Patient location: home  Provider location: Tele-health-home    Review of Systems     Cardiac Risk Factors include: advanced age (>29mn, >>88women);smoking/ tobacco exposure;obesity (BMI >30kg/m2);family history of premature cardiovascular disease;hypertension     Objective:    Today's Vitals   There is no height or weight on file to calculate BMI.     04/21/2022   12:00 PM 04/20/2021    3:32 PM 08/28/2018    1:35 PM 09/21/2017    7:25 AM 08/24/2017    8:52 AM 08/17/2016    8:36 AM 07/17/2015   11:11 AM  Advanced Directives  Does Patient Have a Medical Advance Directive? Yes Yes Yes Yes Yes Yes Yes  Type of AParamedicof AManchester CenterLiving will Living will;Healthcare Power of AKey CenterLiving will HOpalLiving will HWausaLiving will Living will;Healthcare Power of Attorney  Does patient want to make changes to medical advance directive?      Yes (MAU/Ambulatory/Procedural Areas - Information given) No - Patient declined  Copy of H[place]in Chart? No - copy requested No - copy requested No - copy requested No - copy requested No - copy requested No - copy requested No - copy requested    Current Medications (verified) Outpatient Encounter Medications as of 04/21/2022  Medication Sig   amLODipine (NORVASC) 10 MG tablet Take 1 tablet (10 mg total) by mouth daily.   aspirin 81 MG EC tablet Take 1 tablet (81 mg  total) by mouth daily. Swallow whole.   benazepril (LOTENSIN) 40 MG tablet Take 1 tablet (40 mg total) by mouth daily.   rosuvastatin (CRESTOR) 5 MG tablet Take 1 tablet (5 mg total) by mouth once a week.   sildenafil (REVATIO) 20 MG tablet Take 1-5 tabs 30 minutes prior to intercourse   triamcinolone ointment (KENALOG) 0.5 % APPLY TO AFFECTED AREA TWICE A DAY   valACYclovir (VALTREX) 500 MG tablet Take 1 tablet (500 mg total) by mouth 2 (two) times daily as needed.   No facility-administered encounter medications on file as of 04/21/2022.    Allergies (verified) Patient has no known allergies.   History: Past Medical History:  Diagnosis Date   COPD (chronic obstructive pulmonary disease) (HEdna    Coronary artery disease    Hepatitis C    Hernia, inguinal 2014   Hypertension 2009   IFG (impaired fasting glucose) 07/22/2015   Personal history of tobacco use, presenting hazards to health 08/26/2015   Sleep apnea    Past Surgical History:  Procedure Laterality Date   COLONOSCOPY  2010   COLONOSCOPY WITH PROPOFOL N/A 09/21/2017   Procedure: COLONOSCOPY WITH PROPOFOL;  Surgeon: TVirgel Manifold MD;  Location: [facility] ENDOSCOPY;  Service: Endoscopy;  Laterality: N/A;   HERNIA REPAIR  2014   Family History  Problem Relation Age of Onset   Hypertension Mother    Cirrhosis Father    Gout Son    Heart disease Maternal Uncle 568  Heart attack Maternal Uncle    Cancer Paternal  Uncle    Diabetes Paternal Grandmother    Social History   Socioeconomic History   Marital status: Divorced    Spouse name: Not on file   Number of children: Not on file   Years of education: Not on file   Highest education level: Associate degree: academic program  Occupational History   Not on file  Tobacco Use   Smoking status: Every Day    Packs/day: 1.00    Years: 47.50    Total pack years: 47.50    Types: Cigarettes   Smokeless tobacco: Never   Tobacco comments:    has had 5 since jan 2019   Vaping Use   Vaping Use: Some days   Start date: 08/28/2017   Devices: jewel  Substance and Sexual Activity   Alcohol use: No    Alcohol/week: 0.0 standard drinks of alcohol   Drug use: Not Currently   Sexual activity: Yes  Other Topics Concern   Not on file  Social History Narrative   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   Owns legal share company   Peer support    83 year old daughter lives with him    Social Determinants of Health   Financial Resource Strain: [name]  (04/20/2021)   Overall Financial Resource Strain (CARDIA)    Difficulty of Paying Living Expenses: Not hard at all  Food Insecurity: No Food Insecurity (04/21/2022)   Hunger Vital Sign    Worried About Running Out of Food in the Last Year: Never true    [place] in the Last Year: Never true  Transportation Needs: No Transportation Needs (04/20/2021)   PRAPARE - Hydrologist (Medical): No    Lack of Transportation (Non-Medical): No  Physical Activity: Inactive (04/20/2021)   Exercise Vital Sign    Days of Exercise per Week: 0 days    Minutes of Exercise per Session: 0 min  Stress: No Stress Concern Present (04/20/2021)   [place]    Feeling of Stress : Not at all  Social Connections: [place] (04/21/2022)   Social Connection and Isolation Panel [NHANES]    Frequency of Communication with Friends and Family: More than three times a week    Frequency of Social Gatherings with Friends and Family: More than three times a week    Attends Religious Services: More than 4 times per year    Active Member of [employer] or Organizations: Not on file    Attends Music therapist: More than 4 times per year    Marital Status: Married    Tobacco Counseling Ready to quit: Not Answered Counseling given: Not Answered Tobacco comments: has had 5 since jan 2019   Clinical Intake:  Pre-visit preparation completed:  Yes  Pain : No/denies pain     Diabetes: No  How often do you need to have someone help you when you read instructions, pamphlets, or other written materials from your doctor or pharmacy?: 1 - Never  Diabetic?  no  Interpreter Needed?: No  Information entered by :: [name] LPN   Activities of Daily Living    04/21/2022   12:03 PM 11/24/2021    2:17 PM  In your present state of health, do you have any difficulty performing the following activities:  Hearing? 0 0  Vision? 0 0  Difficulty concentrating or making decisions? 0 0  Walking or climbing stairs? 0 0  Dressing or  bathing? 0 0  Doing errands, shopping? 0 0  Preparing Food and eating ? N   Using the Toilet? N   In the past six months, have you accidently leaked urine? N   Do you have problems with loss of bowel control? N   Managing your Medications? N   Managing your Finances? N   Housekeeping or managing your Housekeeping? N     Patient Care Team: Valerie Roys, DO as PCP - General (Family Medicine)  Indicate any recent Medical Services you may have received from other than [facility] providers in the past year (date may be approximate).     Assessment:   This is a routine wellness examination for Ernan.  Hearing/Vision screen Hearing Screening - Comments:: No trouble hearing Vision Screening - Comments:: Not up to date  Dietary issues and exercise activities discussed: Current Exercise Habits: The patient does not participate in regular exercise at present   Goals Addressed             This Visit's Progress    Patient Stated   On track    04/20/2021, stay healthy     Patient Stated       Stay healthy      Quit Smoking   Not on track    Smoking cessation discussed       Depression Screen    04/21/2022   12:39 PM 11/24/2021    2:17 PM 04/22/2021   10:51 AM 04/20/2021    3:33 PM 09/11/2020   10:04 AM 05/15/2020   10:40 AM 08/28/2018    1:34 PM  PHQ 2/9 Scores  PHQ - 2 Score 0 0 0 0 0 0 0   PHQ- 9 Score 0 0    0     Fall Risk    04/21/2022   12:00 PM 11/24/2021    2:17 PM 04/22/2021   10:50 AM 04/20/2021    3:32 PM 09/11/2020   10:04 AM  [place] in the past year? 0 0 0 0 0  Number falls in past yr: 0 0 0  0  Injury with Fall? 0 0 0  0  Risk for fall due to :  No Fall Risks  Medication side effect Orthopedic patient  Follow up Falls evaluation completed;Education provided;Falls prevention discussed Falls evaluation completed Falls evaluation completed Falls evaluation completed;Education provided;Falls prevention discussed Falls evaluation completed    FALL RISK PREVENTION PERTAINING TO THE HOME:  Any stairs in or around the home? No  If so, are there any without handrails? No  Home free of loose throw rugs in walkways, pet beds, electrical cords, etc? Yes  Adequate lighting in your home to reduce risk of falls? Yes   ASSISTIVE DEVICES UTILIZED TO PREVENT FALLS:  Life alert? No  Use of a cane, walker or w/c? No  Grab bars in the bathroom? No  Shower chair or bench in shower? No  Elevated toilet seat or a handicapped toilet? No   TIMED UP AND GO:  Was the test performed? No .    Cognitive Function:        04/21/2022   12:01 PM 04/20/2021    3:34 PM 09/03/2019    1:23 PM 08/28/2018    1:40 PM 08/24/2017    8:58 AM  6CIT Screen  What Year? 0 points 0 points 0 points 0 points 0 points  What month? 0 points 0 points 0 points 0 points 0 points  What time?  0 points 0 points 0 points 0 points 0 points  Count back from 20 0 points 0 points 0 points 0 points 0 points  Months in reverse 0 points 0 points 0 points 0 points 0 points  Repeat phrase 0 points 0 points 0 points 0 points 0 points  Total Score 0 points 0 points 0 points 0 points 0 points    Immunizations Immunization History  Administered Date(s) Administered   Fluad Quad(high Dose 65+) 04/30/2019, 05/26/2020, 04/22/2021   Hepatitis A, Adult 08/14/2015, 02/11/2016   Hepatitis B, adult  07/17/2015, 08/14/2015, 02/11/2016   Influenza, High Dose Seasonal PF 05/07/2016, 08/28/2018   Influenza,inj,Quad PF,6+ Mos 07/17/2015   Influenza-Unspecified 05/16/2014, 05/24/2017   Moderna Sars-Covid-2 Vaccination 07/19/2019, 08/17/2019, 05/13/2020, 11/24/2020, 04/13/2021   Pneumococcal Conjugate-13 05/16/2014   Pneumococcal Polysaccharide-23 07/17/2015   Td 01/23/2003, 07/17/2015    TDAP status: Up to date  Flu Vaccine status: Due, Education has been provided regarding the importance of this vaccine. Advised may receive this vaccine at local pharmacy or Health Dept. Aware to provide a copy of the vaccination record if obtained from local pharmacy or Health Dept. Verbalized acceptance and understanding.  Pneumococcal vaccine status: Up to date  Covid-19 vaccine status: Information provided on how to obtain vaccines.   Qualifies for Shingles Vaccine? Yes   Zostavax completed No   Shingrix Completed?: No.    Education has been provided regarding the importance of this vaccine. Patient has been advised to call insurance company to determine out of pocket expense if they have not yet received this vaccine. Advised may also receive vaccine at local pharmacy or Health Dept. Verbalized acceptance and understanding.  Screening Tests Health Maintenance  Topic Date Due   Zoster Vaccines- Shingrix (1 of 2) Never done   COLONOSCOPY (Pts 45-21yr Insurance coverage will need to be confirmed)  09/21/2020   COVID-19 Vaccine (6 - Moderna series) 06/08/2021   INFLUENZA VACCINE  02/16/2022   TETANUS/TDAP  07/16/2025   Pneumonia Vaccine 76 Years old  Completed   Hepatitis C Screening  Completed   HPV VACCINES  Aged Out    Health Maintenance  Health Maintenance Due  Topic Date Due   Zoster Vaccines- Shingrix (1 of 2) Never done   COLONOSCOPY (Pts 45-460yrInsurance coverage will need to be confirmed)  09/21/2020   COVID-19 Vaccine (6 - Moderna series) 06/08/2021   INFLUENZA VACCINE   02/16/2022    Colorectal cancer screening: Type of screening: Colonoscopy. Completed 2019. Repeat every 5 years  Lung Cancer Screening: (Low Dose CT Chest recommended if Age 74-80ears, 30 pack-year currently smoking OR have quit w/in 15years.) does not qualify.   Lung Cancer Screening Referral:   Additional Screening:  Hepatitis C Screening: does not qualify; Completed 2023  Vision Screening: Recommended annual ophthalmology exams for early detection of glaucoma and other disorders of the eye. Is the patient up to date with their annual eye exam?  No  Who is the provider or what is the name of the office in which the patient attends annual eye exams?  If pt is not established with a provider, would they like to be referred to a provider to establish care? No .   Dental Screening: Recommended annual dental exams for proper oral hygiene  Community Resource Referral / Chronic Care Management: CRR required this visit?  No   CCM required this visit?  No      Plan:     I have personally reviewed and noted the  following in the patient's chart:   Medical and social history Use of alcohol, tobacco or illicit drugs  Current medications and supplements including opioid prescriptions. Patient is not currently taking opioid prescriptions. Functional ability and status Nutritional status Physical activity Advanced directives List of other physicians Hospitalizations, surgeries, and ER visits in previous 12 months Vitals Screenings to include cognitive, depression, and falls Referrals and appointments  In addition, I have reviewed and discussed with patient certain preventive protocols, quality metrics, and best practice recommendations. A written personalized care plan for preventive services as well as general preventive health recommendations were provided to patient.     [name], LPN   89/08/1192   Nurse Notes:       THE ROSUVASTATIN PRESCRIPTION WAS WRITTEN FOR ONCE A  WEEK.    AFTER SPEAKING TO THE PHARMACY THE PATIENT HAS STARTED TAKING IT ONCE A DAY.

## 2022-04-21 NOTE — Patient Instructions (Signed)
[name] , Thank you for taking time to come for your Medicare Wellness Visit. I appreciate your ongoing commitment to your health goals. Please review the following plan we discussed and let me know if I can assist you in the future.   Screening recommendations/referrals: Colonoscopy: up to date Recommended yearly ophthalmology/optometry visit for glaucoma screening and checkup Recommended yearly dental visit for hygiene and checkup  Vaccinations: Influenza vaccine: Education provided Pneumococcal vaccine: up to date Tdap vaccine: up to date Shingles vaccine: Education provided       Preventive Care 14 Years and Older, Male Preventive care refers to lifestyle choices and visits with your health care provider that can promote health and wellness. What does preventive care include? A yearly physical exam. This is also called an annual well check. Dental exams once or twice a year. Routine eye exams. Ask your health care provider how often you should have your eyes checked. Personal lifestyle choices, including: Daily care of your teeth and gums. Regular physical activity. Eating a healthy diet. Avoiding tobacco and drug use. Limiting alcohol use. Practicing safe sex. Taking low doses of aspirin every day. Taking vitamin and mineral supplements as recommended by your health care provider. What happens during an annual well check? The services and screenings done by your health care provider during your annual well check will depend on your age, overall health, lifestyle risk factors, and family history of disease. Counseling  Your health care provider may ask you questions about your: Alcohol use. Tobacco use. Drug use. Emotional well-being. Home and relationship well-being. Sexual activity. Eating habits. History of falls. Memory and ability to understand (cognition). Work and work Statistician. Screening  You may have the following tests or measurements: Height,  weight, and BMI. Blood pressure. Lipid and cholesterol levels. These may be checked every 5 years, or more frequently if you are over 34 years old. Skin check. Lung cancer screening. You may have this screening every year starting at age 37 if you have a 30-pack-year history of smoking and currently smoke or have quit within the past 15 years. Fecal occult blood test (FOBT) of the stool. You may have this test every year starting at age 34. Flexible sigmoidoscopy or colonoscopy. You may have a sigmoidoscopy every 5 years or a colonoscopy every 10 years starting at age 24. Prostate cancer screening. Recommendations will vary depending on your family history and other risks. Hepatitis C blood test. Hepatitis B blood test. Sexually transmitted disease (STD) testing. Diabetes screening. This is done by checking your blood sugar (glucose) after you have not eaten for a while (fasting). You may have this done every 1-3 years. Abdominal aortic aneurysm (AAA) screening. You may need this if you are a current or former smoker. Osteoporosis. You may be screened starting at age 63 if you are at high risk. Talk with your health care provider about your test results, treatment options, and if necessary, the need for more tests. Vaccines  Your health care provider may recommend certain vaccines, such as: Influenza vaccine. This is recommended every year. Tetanus, diphtheria, and acellular pertussis (Tdap, Td) vaccine. You may need a Td booster every 10 years. Zoster vaccine. You may need this after age 47. Pneumococcal 13-valent conjugate (PCV13) vaccine. One dose is recommended after age 68. Pneumococcal polysaccharide (PPSV23) vaccine. One dose is recommended after age 65. Talk to your health care provider about which screenings and vaccines you need and how often you need them. This information is not intended to  replace advice given to you by your health care provider. Make sure you discuss any  questions you have with your health care provider. Document Released: 08/01/2015 Document Revised: 03/24/2016 Document Reviewed: 05/06/2015 Elsevier Interactive Patient Education  2017 [place] Prevention in the Home Falls can cause injuries. They can happen to people of all ages. There are many things you can do to make your home safe and to help prevent falls. What can I do on the outside of my home? Regularly fix the edges of walkways and driveways and fix any cracks. Remove anything that might make you trip as you walk through a door, such as a raised step or threshold. Trim any bushes or trees on the path to your home. Use bright outdoor lighting. Clear any walking paths of anything that might make someone trip, such as rocks or tools. Regularly check to see if handrails are loose or broken. Make sure that both sides of any steps have handrails. Any raised decks and porches should have guardrails on the edges. Have any leaves, snow, or ice cleared regularly. Use sand or salt on walking paths during winter. Clean up any spills in your garage right away. This includes oil or grease spills. What can I do in the bathroom? Use night lights. Install grab bars by the toilet and in the tub and shower. Do not use towel bars as grab bars. Use non-skid mats or decals in the tub or shower. If you need to sit down in the shower, use a plastic, non-slip stool. Keep the floor dry. Clean up any water that spills on the floor as soon as it happens. Remove soap buildup in the tub or shower regularly. Attach bath mats securely with double-sided non-slip rug tape. Do not have throw rugs and other things on the floor that can make you trip. What can I do in the bedroom? Use night lights. Make sure that you have a light by your bed that is easy to reach. Do not use any sheets or blankets that are too big for your bed. They should not hang down onto the floor. Have a firm chair that has side  arms. You can use this for support while you get dressed. Do not have throw rugs and other things on the floor that can make you trip. What can I do in the kitchen? Clean up any spills right away. Avoid walking on wet floors. Keep items that you use a lot in easy-to-reach places. If you need to reach something above you, use a strong step stool that has a grab bar. Keep electrical cords out of the way. Do not use floor polish or wax that makes floors slippery. If you must use wax, use non-skid floor wax. Do not have throw rugs and other things on the floor that can make you trip. What can I do with my stairs? Do not leave any items on the stairs. Make sure that there are handrails on both sides of the stairs and use them. Fix handrails that are broken or loose. Make sure that handrails are as long as the stairways. Check any carpeting to make sure that it is firmly attached to the stairs. Fix any carpet that is loose or worn. Avoid having throw rugs at the top or bottom of the stairs. If you do have throw rugs, attach them to the floor with carpet tape. Make sure that you have a light switch at the top of the stairs and the  bottom of the stairs. If you do not have them, ask someone to add them for you. What else can I do to help prevent falls? Wear shoes that: Do not have high heels. Have rubber bottoms. Are comfortable and fit you well. Are closed at the toe. Do not wear sandals. If you use a stepladder: Make sure that it is fully opened. Do not climb a closed stepladder. Make sure that both sides of the stepladder are locked into place. Ask someone to hold it for you, if possible. Clearly mark and make sure that you can see: Any grab bars or handrails. First and last steps. Where the edge of each step is. Use tools that help you move around (mobility aids) if they are needed. These include: Canes. Walkers. Scooters. Crutches. Turn on the lights when you go into a dark area.  Replace any light bulbs as soon as they burn out. Set up your furniture so you have a clear path. Avoid moving your furniture around. If any of your floors are uneven, fix them. If there are any pets around you, be aware of where they are. Review your medicines with your doctor. Some medicines can make you feel dizzy. This can increase your chance of falling. Ask your doctor what other things that you can do to help prevent falls. This information is not intended to replace advice given to you by your health care provider. Make sure you discuss any questions you have with your health care provider. Document Released: 05/01/2009 Document Revised: 12/11/2015 Document Reviewed: 08/09/2014 Elsevier Interactive Patient Education  2017 [employer].

## 2022-04-23 ENCOUNTER — Ambulatory Visit: Payer: Medicare Other

## 2022-05-09 ENCOUNTER — Other Ambulatory Visit: Payer: Self-pay | Admitting: Family Medicine

## 2022-05-09 DIAGNOSIS — I10 Essential (primary) hypertension: Secondary | ICD-10-CM

## 2022-05-11 NOTE — Telephone Encounter (Signed)
Requested Prescriptions  Pending Prescriptions Disp Refills  . amLODipine (NORVASC) 10 MG tablet [Pharmacy Med Name: AMLODIPINE BESYLATE 10 MG TAB] 90 tablet 1    Sig: TAKE 1 TABLET BY MOUTH EVERY DAY     Cardiovascular: Calcium Channel Blockers 2 Passed - 05/09/2022  8:44 AM      Passed - Last BP in normal range    BP Readings from Last 1 Encounters:  11/24/21 135/70         Passed - Last Heart Rate in normal range    Pulse Readings from Last 1 Encounters:  11/24/21 89         Passed - Valid encounter within last 6 months    Recent Outpatient Visits          5 months ago Routine general medical examination at a health care facility   [facility], [place], DO   1 year ago Primary hypertension   [place], [place] P, DO   1 year ago Routine general medical examination at a health care facility   [facility], [place], DO   1 year ago Chronic obstructive pulmonary disease, unspecified COPD type ([place])   [place], [name], DO   2 years ago Essential hypertension   [place], [place], DO      Future Appointments            In 2 weeks [place], [name], DO [place], [facility]           . benazepril (LOTENSIN) 40 MG tablet [Pharmacy Med Name: BENAZEPRIL HCL 40 MG TABLET] 90 tablet 1    Sig: TAKE 1 TABLET BY MOUTH EVERY DAY     Cardiovascular:  ACE Inhibitors Passed - 05/09/2022  8:44 AM      Passed - Cr in normal range and within 180 days    Creatinine, Ser  Date Value Ref Range Status  12/23/2021 1.27 0.76 - 1.27 mg/dL Final         Passed - K in normal range and within 180 days    Potassium  Date Value Ref Range Status  12/23/2021 4.2 3.5 - 5.2 mmol/L Final         Passed - Patient is not pregnant      Passed - Last BP in normal range    BP Readings from Last 1 Encounters:  11/24/21 135/70         Passed - Valid encounter within last 6 months     Recent Outpatient Visits          5 months ago Routine general medical examination at a health care facility   [place], [place], DO   1 year ago Primary hypertension   [place], [place] P, DO   1 year ago Routine general medical examination at a health care facility   [facility], [place], DO   1 year ago Chronic obstructive pulmonary disease, unspecified COPD type [facility])   [place], [place], DO   2 years ago Essential hypertension   [place], [place], DO      Future Appointments            In 2 weeks Wynetta Emery, [name], DO [place], [facility]           . triamcinolone ointment (KENALOG) 0.5 % [Pharmacy Med Name: TRIAMCINOLONE  0.5% OINTMENT] 45 g 1    Sig: APPLY TO AFFECTED AREA TWICE A DAY     Not Delegated - Dermatology:  Corticosteroids Failed - 05/09/2022  8:44 AM      Failed - This refill cannot be delegated      Passed - Valid encounter within last 12 months    Recent Outpatient Visits          5 months ago Routine general medical examination at a health care facility   [facility], [place], DO   1 year ago Primary hypertension   [place], Dane P, DO   1 year ago Routine general medical examination at a health care facility   [facility], [place], DO   1 year ago Chronic obstructive pulmonary disease, unspecified COPD type [facility])   [place], [place], DO   2 years ago Essential hypertension   [place], [place], DO      Future Appointments            In 2 weeks Wynetta Emery, Barb Merino, DO [place], [facility]           . ASPIRIN LOW DOSE 81 MG tablet [Pharmacy Med Name: ASPIRIN EC 81 MG TABLET] 120 tablet 3    Sig: TAKE 1 TABLET (81 MG TOTAL) BY MOUTH DAILY. SWALLOW WHOLE.     Analgesics:  NSAIDS - aspirin Passed -  05/09/2022  8:44 AM      Passed - Cr in normal range and within 360 days    Creatinine, Ser  Date Value Ref Range Status  12/23/2021 1.27 0.76 - 1.27 mg/dL Final         Passed - eGFR is 10 or above and within 360 days    GFR calc Af Amer  Date Value Ref Range Status  09/11/2020 82 >59 mL/min/1.73 Final    Comment:    **In accordance with recommendations from the NKF-ASN Task force,**   [facility] is in the process of updating its eGFR calculation to the   2021 CKD-EPI creatinine equation that estimates kidney function   without a race variable.    GFR calc non Af Amer  Date Value Ref Range Status  09/11/2020 71 >59 mL/min/1.73 Final   eGFR  Date Value Ref Range Status  12/23/2021 59 (L) >59 mL/min/1.73 Final         Passed - Patient is not pregnant      Passed - Valid encounter within last 12 months    Recent Outpatient Visits          5 months ago Routine general medical examination at a health care facility   [facility], [place], DO   1 year ago Primary hypertension   [place], Clifford P, DO   1 year ago Routine general medical examination at a health care facility   [name], [place], DO   1 year ago Chronic obstructive pulmonary disease, unspecified COPD type [facility])   [place], [place], DO   2 years ago Essential hypertension   [place], [place], DO      Future Appointments            In 2 weeks Wynetta Emery, Barb Merino, DO [employer], [facility]

## 2022-05-27 ENCOUNTER — Encounter: Payer: Self-pay | Admitting: Family Medicine

## 2022-05-27 ENCOUNTER — Ambulatory Visit (INDEPENDENT_AMBULATORY_CARE_PROVIDER_SITE_OTHER): Payer: Medicare Other | Admitting: Family Medicine

## 2022-05-27 VITALS — BP 105/54 | HR 103 | Temp 98.8°F | Ht 71.0 in | Wt 217.0 lb

## 2022-05-27 DIAGNOSIS — I700 Atherosclerosis of aorta: Secondary | ICD-10-CM

## 2022-05-27 DIAGNOSIS — Z1211 Encounter for screening for malignant neoplasm of colon: Secondary | ICD-10-CM | POA: Diagnosis not present

## 2022-05-27 DIAGNOSIS — I10 Essential (primary) hypertension: Secondary | ICD-10-CM | POA: Diagnosis not present

## 2022-05-27 DIAGNOSIS — I25118 Atherosclerotic heart disease of native coronary artery with other forms of angina pectoris: Secondary | ICD-10-CM

## 2022-05-27 MED ORDER — AMLODIPINE BESYLATE 5 MG PO TABS: 5.0000 mg | ORAL_TABLET | Freq: Every day | ORAL | 1 refills | Status: DC

## 2022-05-27 MED ORDER — VALACYCLOVIR HCL 500 MG PO TABS: 500.0000 mg | ORAL_TABLET | Freq: Two times a day (BID) | ORAL | 5 refills | Status: DC | PRN

## 2022-05-27 NOTE — Assessment & Plan Note (Signed)
BP running low. Will cut his amlodpine to '5mg'$  and recheck 1 month. Call with any concerns.

## 2022-05-27 NOTE — Assessment & Plan Note (Signed)
Under good control on current regimen. Continue current regimen. Continue to monitor. Call with any concerns. Refills given. Labs drawn today.   

## 2022-05-27 NOTE — Progress Notes (Signed)
BP (!) 105/54   Pulse (!) 103   Temp 98.8 F (37.1 C) (Oral)   Ht _0  (1.803 m)   Wt 217 lb (98.4 kg)   SpO2 98%   BMI 30.27 kg/m    Subjective:    Patient ID: Nathan Cooper, male    DOB: [birth year], 76 y.o.   MRN: [id number]  HPI: Nathan Cooper is a 76 y.o. male  Chief Complaint  Patient presents with   Coronary Artery Disease   Hypertension   HYPERTENSION / [place] Satisfied with current treatment? yes Duration of hypertension: chronic BP monitoring frequency: not checking BP medication side effects: no Past BP meds: amlodipine, benazepril Duration of hyperlipidemia: chronic Cholesterol medication side effects: no Cholesterol supplements: none Past cholesterol medications: crestor Medication compliance: excellent compliance Aspirin: yes Recent stressors: no Recurrent headaches: no Visual changes: no Palpitations: no Dyspnea: no Chest pain: no Lower extremity edema: no Dizzy/lightheaded: no  Relevant past medical, surgical, family and social history reviewed and updated as indicated. Interim medical history since our last visit reviewed. Allergies and medications reviewed and updated.  Review of Systems  Constitutional: Negative.   Respiratory: Negative.    Cardiovascular: Negative.   Gastrointestinal: Negative.   Musculoskeletal: Negative.   Psychiatric/Behavioral: Negative.      Per HPI unless specifically indicated above     Objective:    BP (!) 105/54   Pulse (!) 103   Temp 98.8 F (37.1 C) (Oral)   Ht _1  (1.803 m)   Wt 217 lb (98.4 kg)   SpO2 98%   BMI 30.27 kg/m   Wt Readings from Last 3 Encounters:  05/27/22 217 lb (98.4 kg)  11/24/21 216 lb 9.6 oz (98.2 kg)  07/30/21 215 lb (97.5 kg)    Physical Exam Vitals and nursing note reviewed.  Constitutional:      General: He is not in acute distress.    Appearance: Normal appearance. He is obese. He is not ill-appearing, toxic-appearing or diaphoretic.  HENT:      Head: Normocephalic and atraumatic.     Right Ear: External ear normal.     Left Ear: External ear normal.     Nose: Nose normal.     Mouth/Throat:     Mouth: Mucous membranes are moist.     Pharynx: Oropharynx is clear.  Eyes:     General: No scleral icterus.       Right eye: No discharge.        Left eye: No discharge.     Extraocular Movements: Extraocular movements intact.     Conjunctiva/sclera: Conjunctivae normal.     Pupils: Pupils are equal, round, and reactive to light.  Cardiovascular:     Rate and Rhythm: Normal rate and regular rhythm.     Pulses: Normal pulses.     Heart sounds: Normal heart sounds. No murmur Cooper.    No friction rub. No gallop.  Pulmonary:     Effort: Pulmonary effort is normal. No respiratory distress.     Breath sounds: Normal breath sounds. No stridor. No wheezing, rhonchi or rales.  Chest:     Chest wall: No tenderness.  Musculoskeletal:        General: Normal range of motion.     Cervical back: Normal range of motion and neck supple.  Skin:    General: Skin is warm and dry.     Capillary Refill: Capillary refill takes less than 2 seconds.     Coloration: Skin  is not jaundiced or pale.     Findings: No bruising, erythema, lesion or rash.  Neurological:     General: No focal deficit present.     Mental Status: He is alert and oriented to person, place, and time. Mental status is at baseline.  Psychiatric:        Mood and Affect: Mood normal.        Behavior: Behavior normal.        Thought Content: Thought content normal.        Judgment: Judgment normal.     Results for orders placed or performed in visit on 12/23/21  Comp Met (CMET)  Result Value Ref Range   Glucose 163 (H) 70 - 99 mg/dL   BUN 15 8 - 27 mg/dL   Creatinine, Ser 1.27 0.76 - 1.27 mg/dL   eGFR 59 (L) >59 mL/min/1.73   BUN/Creatinine Ratio 12 10 - 24   Sodium 136 134 - 144 mmol/L   Potassium 4.2 3.5 - 5.2 mmol/L   Chloride 99 96 - 106 mmol/L   CO2 21 20 - 29  mmol/L   Calcium 9.2 8.6 - 10.2 mg/dL   Total Protein 7.6 6.0 - 8.5 g/dL   Albumin 4.5 3.7 - 4.7 g/dL   Globulin, Total 3.1 1.5 - 4.5 g/dL   Albumin/Globulin Ratio 1.5 1.2 - 2.2   Bilirubin Total 0.4 0.0 - 1.2 mg/dL   Alkaline Phosphatase 85 44 - 121 IU/L   AST 22 0 - 40 IU/L   ALT 23 0 - 44 IU/L  Lipid Panel w/o Chol/HDL Ratio  Result Value Ref Range   Cholesterol, Total 129 100 - 199 mg/dL   Triglycerides 60 0 - 149 mg/dL   HDL 45 >39 mg/dL   VLDL Cholesterol Cal 13 5 - 40 mg/dL   LDL Chol Calc (NIH) 71 0 - 99 mg/dL  PSA  Result Value Ref Range   Prostate Specific Ag, Serum 1.3 0.0 - 4.0 ng/mL      Assessment & Plan:   Problem List Items Addressed This Visit       Cardiovascular and Mediastinum   HTN (hypertension) - Primary    BP running low. Will cut his amlodpine to 40m and recheck 1 month. Call with any concerns.       Relevant Medications   amLODipine (NORVASC) 5 MG tablet   Other Relevant Orders   Comprehensive metabolic panel   CAD (coronary artery disease)    Under good control on current regimen. Continue current regimen. Continue to monitor. Call with any concerns. Refills given. Labs drawn today.       Relevant Medications   amLODipine (NORVASC) 5 MG tablet   Other Relevant Orders   Comprehensive metabolic panel   Lipid Panel w/o Chol/HDL Ratio   Atherosclerosis of aorta (HCC)    Under good control on current regimen. Continue current regimen. Continue to monitor. Call with any concerns. Refills given. Labs drawn today.       Relevant Medications   amLODipine (NORVASC) 5 MG tablet   Other Relevant Orders   Comprehensive metabolic panel   Lipid Panel w/o Chol/HDL Ratio   Other Visit Diagnoses     Screening for colon cancer       Relevant Orders   Ambulatory referral to Gastroenterology        Follow up plan: Return in about 4 weeks (around 06/24/2022).

## 2022-05-28 LAB — LIPID PANEL W/O CHOL/HDL RATIO
Cholesterol, Total: 133 mg/dL (ref 100–199)
HDL: 47 mg/dL (ref >39)
LDL Chol Calc (NIH): 69 mg/dL (ref 0–99)
Triglycerides: 89 mg/dL (ref 0–149)
VLDL Cholesterol Cal: 17 mg/dL (ref 5–40)

## 2022-05-28 LAB — COMPREHENSIVE METABOLIC PANEL
ALT: 10 IU/L (ref 0–44)
AST: 17 IU/L (ref 0–40)
Albumin/Globulin Ratio: 1.8 (ref 1.2–2.2)
Albumin: 4.4 g/dL (ref 3.8–4.8)
Alkaline Phosphatase: 63 IU/L (ref 44–121)
BUN/Creatinine Ratio: 11 (ref 10–24)
BUN: 14 mg/dL (ref 8–27)
Bilirubin Total: 0.2 mg/dL (ref 0.0–1.2)
CO2: 22 mmol/L (ref 20–29)
Calcium: 8.8 mg/dL (ref 8.6–10.2)
Chloride: 104 mmol/L (ref 96–106)
Creatinine, Ser: 1.26 mg/dL (ref 0.76–1.27)
Globulin, Total: 2.5 g/dL (ref 1.5–4.5)
Glucose: 97 mg/dL (ref 70–99)
Potassium: 4 mmol/L (ref 3.5–5.2)
Sodium: 138 mmol/L (ref 134–144)
Total Protein: 6.9 g/dL (ref 6.0–8.5)
eGFR: 60 mL/min/{1.73_m2} (ref >59)

## 2022-06-24 ENCOUNTER — Ambulatory Visit (INDEPENDENT_AMBULATORY_CARE_PROVIDER_SITE_OTHER): Payer: Medicare Other | Admitting: Family Medicine

## 2022-06-24 ENCOUNTER — Encounter: Payer: Self-pay | Admitting: Family Medicine

## 2022-06-24 VITALS — BP 130/72 | HR 83 | Temp 98.5°F | Ht 71.0 in | Wt 217.3 lb

## 2022-06-24 DIAGNOSIS — I10 Essential (primary) hypertension: Secondary | ICD-10-CM | POA: Diagnosis not present

## 2022-06-24 NOTE — Assessment & Plan Note (Signed)
Under good control on current regimen. Continue current regimen. Continue to monitor. Call with any concerns. Refills up to date. Will check BMP. Follow up 5 months for physical.

## 2022-06-24 NOTE — Progress Notes (Signed)
 BP 130/72 (BP Location: Left Arm, Cuff Size: Normal)   Pulse 83   Temp 98.5 F (36.9 C) (Oral)   Ht 5' 11" (1.803 m)   Wt 217 lb 4.8 oz (98.6 kg)   SpO2 97%   BMI 30.31 kg/m    Subjective:    Patient ID: Nathan Cooper, male    DOB: 06/24/1946, 76 y.o.   MRN: [id number]  HPI: Nathan Cooper is a 76 y.o. male  Chief Complaint  Patient presents with   Hypertension   Coronary Artery Disease   HYPERTENSION  Hypertension status: controlled  Satisfied with current treatment? yes Duration of hypertension: chronic BP monitoring frequency:  not checking BP medication side effects:  no Medication compliance: excellent compliance Previous BP meds:benazepril, amlodipine Aspirin: yes Recurrent headaches: no Visual changes: no Palpitations: no Dyspnea: no Chest pain: no Lower extremity edema: no Dizzy/lightheaded: no  Relevant past medical, surgical, family and social history reviewed and updated as indicated. Interim medical history since our last visit reviewed. Allergies and medications reviewed and updated.  Review of Systems  Constitutional: Negative.   Respiratory: Negative.    Cardiovascular: Negative.   Gastrointestinal: Negative.   Musculoskeletal: Negative.   Neurological: Negative.   Psychiatric/Behavioral: Negative.      Per HPI unless specifically indicated above     Objective:    BP 130/72 (BP Location: Left Arm, Cuff Size: Normal)   Pulse 83   Temp 98.5 F (36.9 C) (Oral)   Ht 5' 11" (1.803 m)   Wt 217 lb 4.8 oz (98.6 kg)   SpO2 97%   BMI 30.31 kg/m   Wt Readings from Last 3 Encounters:  06/24/22 217 lb 4.8 oz (98.6 kg)  05/27/22 217 lb (98.4 kg)  11/24/21 216 lb 9.6 oz (98.2 kg)    Physical Exam Vitals and nursing note reviewed.  Constitutional:      General: He is not in acute distress.    Appearance: Normal appearance. He is normal weight. He is not ill-appearing, toxic-appearing or diaphoretic.  HENT:     Head:  Normocephalic and atraumatic.     Right Ear: External ear normal.     Left Ear: External ear normal.     Nose: Nose normal.     Mouth/Throat:     Mouth: Mucous membranes are moist.     Pharynx: Oropharynx is clear.  Eyes:     General: No scleral icterus.       Right eye: No discharge.        Left eye: No discharge.     Extraocular Movements: Extraocular movements intact.     Conjunctiva/sclera: Conjunctivae normal.     Pupils: Pupils are equal, round, and reactive to light.  Cardiovascular:     Rate and Rhythm: Normal rate and regular rhythm.     Pulses: Normal pulses.     Heart sounds: Normal heart sounds. No murmur heard.    No friction rub. No gallop.  Pulmonary:     Effort: Pulmonary effort is normal. No respiratory distress.     Breath sounds: Normal breath sounds. No stridor. No wheezing, rhonchi or rales.  Chest:     Chest wall: No tenderness.  Musculoskeletal:        General: Normal range of motion.     Cervical back: Normal range of motion and neck supple.  Skin:    General: Skin is warm and dry.     Capillary Refill: Capillary refill takes less than 2 seconds.       Coloration: Skin is not jaundiced or pale.     Findings: No bruising, erythema, lesion or rash.  Neurological:     General: No focal deficit present.     Mental Status: He is alert and oriented to person, place, and time. Mental status is at baseline.  Psychiatric:        Mood and Affect: Mood normal.        Behavior: Behavior normal.        Thought Content: Thought content normal.        Judgment: Judgment normal.     Results for orders placed or performed in visit on 05/27/22  Comprehensive metabolic panel  Result Value Ref Range   Glucose 97 70 - 99 mg/dL   BUN 14 8 - 27 mg/dL   Creatinine, Ser 1.26 0.76 - 1.27 mg/dL   eGFR 60 >59 mL/min/1.73   BUN/Creatinine Ratio 11 10 - 24   Sodium 138 134 - 144 mmol/L   Potassium 4.0 3.5 - 5.2 mmol/L   Chloride 104 96 - 106 mmol/L   CO2 22 20 - 29  mmol/L   Calcium 8.8 8.6 - 10.2 mg/dL   Total Protein 6.9 6.0 - 8.5 g/dL   Albumin 4.4 3.8 - 4.8 g/dL   Globulin, Total 2.5 1.5 - 4.5 g/dL   Albumin/Globulin Ratio 1.8 1.2 - 2.2   Bilirubin Total 0.2 0.0 - 1.2 mg/dL   Alkaline Phosphatase 63 44 - 121 IU/L   AST 17 0 - 40 IU/L   ALT 10 0 - 44 IU/L  Lipid Panel w/o Chol/HDL Ratio  Result Value Ref Range   Cholesterol, Total 133 100 - 199 mg/dL   Triglycerides 89 0 - 149 mg/dL   HDL 47 >39 mg/dL   VLDL Cholesterol Cal 17 5 - 40 mg/dL   LDL Chol Calc (NIH) 69 0 - 99 mg/dL      Assessment & Plan:   Problem List Items Addressed This Visit       Cardiovascular and Mediastinum   HTN (hypertension) - Primary    Under good control on current regimen. Continue current regimen. Continue to monitor. Call with any concerns. Refills up to date. Will check BMP. Follow up 5 months for physical.        Relevant Orders   Basic metabolic panel     Follow up plan: Return in about 5 months (around 11/23/2022) for physical.

## 2022-06-25 LAB — BASIC METABOLIC PANEL
BUN/Creatinine Ratio: 12 (ref 10–24)
BUN: 14 mg/dL (ref 8–27)
CO2: 20 mmol/L (ref 20–29)
Calcium: 9.3 mg/dL (ref 8.6–10.2)
Chloride: 101 mmol/L (ref 96–106)
Creatinine, Ser: 1.13 mg/dL (ref 0.76–1.27)
Glucose: 93 mg/dL (ref 70–99)
Potassium: 4.3 mmol/L (ref 3.5–5.2)
Sodium: 136 mmol/L (ref 134–144)
eGFR: 68 mL/min/{1.73_m2} (ref >59)

## 2022-07-30 ENCOUNTER — Ambulatory Visit: Payer: Medicare PPO | Attending: Family Medicine

## 2022-10-08 ENCOUNTER — Other Ambulatory Visit: Payer: Self-pay | Admitting: Family Medicine

## 2022-10-08 NOTE — Telephone Encounter (Signed)
Requested Prescriptions  Pending Prescriptions Disp Refills   rosuvastatin (CRESTOR) 5 MG tablet [Pharmacy Med Name: ROSUVASTATIN CALCIUM 5 MG TAB] 12 tablet 4    Sig: TAKE 1 TABLET BY MOUTH ONCE A WEEK.     Cardiovascular:  Antilipid - Statins 2 Failed - 10/08/2022  2:13 AM      Failed - Lipid Panel in normal range within the last 12 months    Cholesterol, Total  Date Value Ref Range Status  05/27/2022 133 100 - 199 mg/dL Final   LDL Chol Calc (NIH)  Date Value Ref Range Status  05/27/2022 69 0 - 99 mg/dL Final   HDL  Date Value Ref Range Status  05/27/2022 47 >39 mg/dL Final   Triglycerides  Date Value Ref Range Status  05/27/2022 89 0 - 149 mg/dL Final         Passed - Cr in normal range and within 360 days    Creatinine, Ser  Date Value Ref Range Status  06/24/2022 1.13 0.76 - 1.27 mg/dL Final         Passed - Patient is not pregnant      Passed - Valid encounter within last 12 months    Recent Outpatient Visits           3 months ago Primary hypertension   [place], [place], DO   4 months ago Primary hypertension   [place], [place], DO   10 months ago Routine general medical examination at a health care facility   [place], [place], DO   1 year ago Primary hypertension   [place], [place] P, DO   2 years ago Routine general medical examination at a health care facility   [facility] Valerie Roys, DO       Future Appointments             In 1 month Wynetta Emery, Barb Merino, DO [place], [facility]

## 2022-10-18 ENCOUNTER — Encounter: Payer: Self-pay | Admitting: Family Medicine

## 2022-10-18 ENCOUNTER — Ambulatory Visit (INDEPENDENT_AMBULATORY_CARE_PROVIDER_SITE_OTHER): Payer: Medicare PPO | Admitting: Family Medicine

## 2022-10-18 VITALS — BP 124/69 | HR 87 | Temp 98.6°F | Ht 71.0 in | Wt 213.3 lb

## 2022-10-18 DIAGNOSIS — L03116 Cellulitis of left lower limb: Secondary | ICD-10-CM

## 2022-10-18 MED ORDER — SULFAMETHOXAZOLE-TRIMETHOPRIM 800-160 MG PO TABS: 1.0000 | ORAL_TABLET | Freq: Two times a day (BID) | ORAL | 0 refills | Status: DC

## 2022-10-18 NOTE — Progress Notes (Signed)
BP 124/69   Pulse 87   Temp 98.6 F (37 C) (Oral)   Ht 5\' 11"  (1.803 m)   Wt 213 lb 4.8 oz (96.8 kg)   SpO2 96%   BMI 29.75 kg/m    Subjective:    Patient ID: Nathan Cooper, male    DOB: 1945/08/13, 77 y.o.   MRN: [id number]  HPI: Nathan Cooper is a 77 y.o. male  Chief Complaint  Patient presents with   Rash    Patient says he has been had the rash for about 3 weeks. Patient says he has been trying over the counter creams. Patient says over the last 3-4 days he says it looks as it has been healing.    RASH Duration:   3 weeks   Location: L ankle  Itching: no Burning: yes Redness: yes Oozing: yes Scaling: yes Blisters: no Painful: yes Fevers: no Change in detergents/soaps/personal care products: no Recent illness: no Recent travel:no History of same: no Context: better Alleviating factors: nothing Treatments attempted:hydrocortisone cream, benadryl, OTC anit-fungal, and lotion/moisturizer Shortness of breath: no  Throat/tongue swelling: no Myalgias/arthralgias: no  Relevant past medical, surgical, family and social history reviewed and updated as indicated. Interim medical history since our last visit reviewed. Allergies and medications reviewed and updated.  Review of Systems  Constitutional: Negative.   Respiratory: Negative.    Cardiovascular: Negative.   Musculoskeletal: Negative.   Skin:  Positive for color change and wound. Negative for pallor and rash.  Neurological: Negative.   Psychiatric/Behavioral: Negative.      Per HPI unless specifically indicated above     Objective:    BP 124/69   Pulse 87   Temp 98.6 F (37 C) (Oral)   Ht 5\' 11"  (1.803 m)   Wt 213 lb 4.8 oz (96.8 kg)   SpO2 96%   BMI 29.75 kg/m   Wt Readings from Last 3 Encounters:  10/18/22 213 lb 4.8 oz (96.8 kg)  06/24/22 217 lb 4.8 oz (98.6 kg)  05/27/22 217 lb (98.4 kg)    Physical Exam Vitals and nursing note reviewed.  Constitutional:      General: He  is not in acute distress.    Appearance: Normal appearance. He is well-developed.  HENT:     Head: Normocephalic and atraumatic.     Right Ear: Hearing and external ear normal.     Left Ear: Hearing and external ear normal.     Nose: Nose normal.     Mouth/Throat:     Mouth: Mucous membranes are moist.     Pharynx: Oropharynx is clear.  Eyes:     General: Lids are normal. No scleral icterus.       Right eye: No discharge.        Left eye: No discharge.     Conjunctiva/sclera: Conjunctivae normal.  Pulmonary:     Effort: Pulmonary effort is normal. No respiratory distress.  Musculoskeletal:        General: Normal range of motion.  Skin:    Coloration: Skin is not jaundiced or pale.     Findings: No bruising, erythema, lesion or rash.     Comments: 3 in x 1 inch scaly erythematous patch on lateral L ankle  Neurological:     General: No focal deficit present.     Mental Status: He is alert and oriented to person, place, and time. Mental status is at baseline.  Psychiatric:        Mood and Affect:  Mood normal.        Speech: Speech normal.        Behavior: Behavior normal.        Thought Content: Thought content normal.        Judgment: Judgment normal.     Results for orders placed or performed in visit on [id number]  Basic metabolic panel  Result Value Ref Range   Glucose 93 70 - 99 mg/dL   BUN 14 8 - 27 mg/dL   Creatinine, Ser 1.13 0.76 - 1.27 mg/dL   eGFR 68 >59 mL/min/1.73   BUN/Creatinine Ratio 12 10 - 24   Sodium 136 134 - 144 mmol/L   Potassium 4.3 3.5 - 5.2 mmol/L   Chloride 101 96 - 106 mmol/L   CO2 20 20 - 29 mmol/L   Calcium 9.3 8.6 - 10.2 mg/dL      Assessment & Plan:   Problem List Items Addressed This Visit   None Visit Diagnoses     Cellulitis of left lower extremity    -  Primary   Will treat with bactrim. Recheck 7-10 days. Call with any concerns.        Follow up plan: Return 7-10 days.

## 2022-10-29 ENCOUNTER — Ambulatory Visit: Payer: Medicare PPO | Admitting: Family Medicine

## 2022-11-02 ENCOUNTER — Ambulatory Visit (INDEPENDENT_AMBULATORY_CARE_PROVIDER_SITE_OTHER): Payer: Medicare PPO | Admitting: Family Medicine

## 2022-11-02 ENCOUNTER — Encounter: Payer: Self-pay | Admitting: Family Medicine

## 2022-11-02 VITALS — BP 111/62 | HR 83 | Temp 98.6°F | Ht 71.0 in | Wt 213.9 lb

## 2022-11-02 DIAGNOSIS — L03116 Cellulitis of left lower limb: Secondary | ICD-10-CM

## 2022-11-02 DIAGNOSIS — I10 Essential (primary) hypertension: Secondary | ICD-10-CM | POA: Diagnosis not present

## 2022-11-02 DIAGNOSIS — R21 Rash and other nonspecific skin eruption: Secondary | ICD-10-CM

## 2022-11-02 MED ORDER — CLOTRIMAZOLE-BETAMETHASONE 1-0.05 % EX CREA: 1.0000 | TOPICAL_CREAM | Freq: Every day | CUTANEOUS | 3 refills | Status: DC

## 2022-11-02 MED ORDER — AMLODIPINE BESYLATE 10 MG PO TABS: 10.0000 mg | ORAL_TABLET | Freq: Every day | ORAL | 1 refills | Status: DC

## 2022-11-02 NOTE — Assessment & Plan Note (Signed)
Has been taking  of amlodipine. BP under good control. Conitnue current regimen. Call with any concerns.

## 2022-11-02 NOTE — Progress Notes (Signed)
BP 111/62   Pulse 83   Temp 98.6 F (37 C) (Oral)   Ht  (1.803 m)   Wt 213 lb 14.4 oz (97 kg)   SpO2 97%   BMI 29.83 kg/m    Subjective:    Patient ID: Nathan Cooper, male    DOB: August 30, 1945, 77 y.o.   MRN: [id number]  HPI: Nathan Cooper is a 77 y.o. male  Chief Complaint  Patient presents with   Cellulitis    Patient says it is still draining just a little and it is drying out in some spot.    Medication Management    Patient says he is unsure if he is supposed to be taking Amlodipine  or 10 mg   RASH Duration:  about a month  Location: L ankle  Itching: no Burning: yes Redness: yes Oozing: no Scaling: yes Blisters: no Painful: yes Fevers: no Change in detergents/soaps/personal care products: no Recent illness: no Recent travel:no History of same: no Context: better Alleviating factors: antibiotic Treatments attempted:hydrocortisone cream, benadryl, OTC anit-fungal, and lotion/moisturizer Shortness of breath: no  Throat/tongue swelling: no Myalgias/arthralgias: no   Relevant past medical, surgical, family and social history reviewed and updated as indicated. Interim medical history since our last visit reviewed. Allergies and medications reviewed and updated.  Review of Systems  Constitutional: Negative.   Respiratory: Negative.    Cardiovascular: Negative.   Gastrointestinal: Negative.   Skin:  Positive for rash. Negative for color change, pallor and wound.  Psychiatric/Behavioral: Negative.      Per HPI unless specifically indicated above     Objective:    BP 111/62   Pulse 83   Temp 98.6 F (37 C) (Oral)   Ht  (1.803 m)   Wt 213 lb 14.4 oz (97 kg)   SpO2 97%   BMI 29.83 kg/m   Wt Readings from Last 3 Encounters:  11/02/22 213 lb 14.4 oz (97 kg)  10/18/22 213 lb 4.8 oz (96.8 kg)  06/24/22 217 lb 4.8 oz (98.6 kg)    Physical Exam Vitals and nursing note reviewed.  Constitutional:      General: He is not in  acute distress.    Appearance: Normal appearance. He is not ill-appearing, toxic-appearing or diaphoretic.  HENT:     Head: Normocephalic and atraumatic.     Right Ear: External ear normal.     Left Ear: External ear normal.     Nose: Nose normal.     Mouth/Throat:     Mouth: Mucous membranes are moist.     Pharynx: Oropharynx is clear.  Eyes:     General: No scleral icterus.       Right eye: No discharge.        Left eye: No discharge.     Extraocular Movements: Extraocular movements intact.     Conjunctiva/sclera: Conjunctivae normal.     Pupils: Pupils are equal, round, and reactive to light.  Cardiovascular:     Rate and Rhythm: Normal rate and regular rhythm.     Pulses: Normal pulses.     Heart sounds: Normal heart sounds. No murmur heard.    No friction rub. No gallop.  Pulmonary:     Effort: Pulmonary effort is normal. No respiratory distress.     Breath sounds: Normal breath sounds. No stridor. No wheezing, rhonchi or rales.  Chest:     Chest wall: No tenderness.  Musculoskeletal:        General: Normal range  of motion.     Cervical back: Normal range of motion and neck supple.  Skin:    General: Skin is warm and dry.     Capillary Refill: Capillary refill takes less than 2 seconds.     Coloration: Skin is not jaundiced or pale.     Findings: No bruising, erythema, lesion or rash.     Comments: 4 inch hyperpigmented cracked plaque on L lateral ankle  Neurological:     General: No focal deficit present.     Mental Status: He is alert and oriented to person, place, and time. Mental status is at baseline.  Psychiatric:        Mood and Affect: Mood normal.        Behavior: Behavior normal.        Thought Content: Thought content normal.        Judgment: Judgment normal.     Results for orders placed or performed in visit on 06/24/22  Basic metabolic panel  Result Value Ref Range   Glucose 93 70 - 99 mg/dL   BUN 14 8 - 27 mg/dL   Creatinine, Ser 1.61 0.76 -  1.27 mg/dL   eGFR 68 >09 UE/AVW/0.98   BUN/Creatinine Ratio 12 10 - 24   Sodium 136 134 - 144 mmol/L   Potassium 4.3 3.5 - 5.2 mmol/L   Chloride 101 96 - 106 mmol/L   CO2 20 20 - 29 mmol/L   Calcium 9.3 8.6 - 10.2 mg/dL      Assessment & Plan:   Problem List Items Addressed This Visit       Cardiovascular and Mediastinum   HTN (hypertension)    Has been taking  of amlodipine. BP under good control. Conitnue current regimen. Call with any concerns.       Relevant Medications   amLODipine (NORVASC) 10 MG tablet   Other Visit Diagnoses     Rash and nonspecific skin eruption    -  Primary   Cellulitis has resolved, but plaque has remained. Will treat with lotrisone and get him into dermatology. Continue to monitor.   Relevant Orders   Ambulatory referral to Dermatology   Cellulitis of left lower extremity       Resolved, but continues with plaque.        Follow up plan: Return as scheduled.

## 2022-11-04 ENCOUNTER — Ambulatory Visit: Payer: Medicare PPO | Admitting: Family Medicine

## 2022-11-23 ENCOUNTER — Ambulatory Visit: Payer: Medicare PPO | Admitting: Family Medicine

## 2022-12-03 ENCOUNTER — Other Ambulatory Visit: Payer: Self-pay | Admitting: Family Medicine

## 2022-12-03 DIAGNOSIS — N529 Male erectile dysfunction, unspecified: Secondary | ICD-10-CM

## 2022-12-03 NOTE — Telephone Encounter (Signed)
Requested Prescriptions  Pending Prescriptions Disp Refills   sildenafil (REVATIO) 20 MG tablet [Pharmacy Med Name: SILDENAFIL CITRATE 20 MG TAB] 270 tablet 4    Sig: TAKE 1-5 TABLETS BY MOUTH 30 MINUTES PRIOR TO INTERCOURSE     Urology: Erectile Dysfunction Agents Passed - 12/03/2022 10:56 AM      Passed - AST in normal range and within 360 days    AST  Date Value Ref Range Status  05/27/2022 17 0 - 40 IU/L Final         Passed - ALT in normal range and within 360 days    ALT  Date Value Ref Range Status  05/27/2022 10 0 - 44 IU/L Final         Passed - Last BP in normal range    BP Readings from Last 1 Encounters:  11/02/22 111/62         Passed - Valid encounter within last 12 months    Recent Outpatient Visits           1 month ago Rash and nonspecific skin eruption   [place] [facility] [place], [name], DO   1 month ago Cellulitis of left lower extremity   [place] [facility] [place], [name], DO   5 months ago Primary hypertension   [place] [facility] [place], [place], DO   6 months ago Primary hypertension    [facility] [place], [name], DO   1 year ago Routine general medical examination at a health care facility   [facility], [name], DO

## 2022-12-30 ENCOUNTER — Other Ambulatory Visit: Payer: Self-pay | Admitting: Family Medicine

## 2022-12-30 DIAGNOSIS — I10 Essential (primary) hypertension: Secondary | ICD-10-CM

## 2022-12-30 NOTE — Telephone Encounter (Signed)
Requested Prescriptions  Pending Prescriptions Disp Refills   benazepril (LOTENSIN) 40 MG tablet [Pharmacy Med Name: BENAZEPRIL HCL 40 MG TABLET] 90 tablet 0    Sig: TAKE 1 TABLET BY MOUTH EVERY DAY     Cardiovascular:  ACE Inhibitors Failed - 12/30/2022  2:31 AM      Failed - Cr in normal range and within 180 days    Creatinine, Ser  Date Value Ref Range Status  06/24/2022 1.13 0.76 - 1.27 mg/dL Final         Failed - K in normal range and within 180 days    Potassium  Date Value Ref Range Status  06/24/2022 4.3 3.5 - 5.2 mmol/L Final         Passed - Patient is not pregnant      Passed - Last BP in normal range    BP Readings from Last 1 Encounters:  11/02/22 111/62         Passed - Valid encounter within last 6 months    Recent Outpatient Visits           1 month ago Rash and nonspecific skin eruption   [place] [facility] [place], [name], DO   2 months ago Cellulitis of left lower extremity   [place] [facility] [place], [name], DO   6 months ago Primary hypertension   [facility] [place], [place], DO   7 months ago Primary hypertension   [place] [facility] [place], [name], DO   1 year ago Routine general medical examination at a health care facility   [facility], [name], DO

## 2023-01-01 ENCOUNTER — Other Ambulatory Visit: Payer: Self-pay | Admitting: Family Medicine

## 2023-01-03 NOTE — Telephone Encounter (Signed)
Over due for physical

## 2023-01-03 NOTE — Telephone Encounter (Signed)
Requested medication (s) are due for refill today: Yes  Requested medication (s) are on the active medication list: Yes  Last refill:  11/26/21  Future visit scheduled: No  Notes to clinic:  Prescription has expired.    Requested Prescriptions  Pending Prescriptions Disp Refills   rosuvastatin (CRESTOR) 5 MG tablet [Pharmacy Med Name: ROSUVASTATIN CALCIUM 5 MG TAB] 12 tablet 4    Sig: TAKE 1 TABLET BY MOUTH ONCE A WEEK.     Cardiovascular:  Antilipid - Statins 2 Failed - 01/01/2023 10:49 AM      Failed - Lipid Panel in normal range within the last 12 months    Cholesterol, Total  Date Value Ref Range Status  05/27/2022 133 100 - 199 mg/dL Final   LDL Chol Calc (NIH)  Date Value Ref Range Status  05/27/2022 69 0 - 99 mg/dL Final   HDL  Date Value Ref Range Status  05/27/2022 47 >39 mg/dL Final   Triglycerides  Date Value Ref Range Status  05/27/2022 89 0 - 149 mg/dL Final         Passed - Cr in normal range and within 360 days    Creatinine, Ser  Date Value Ref Range Status  06/24/2022 1.13 0.76 - 1.27 mg/dL Final         Passed - Patient is not pregnant      Passed - Valid encounter within last 12 months    Recent Outpatient Visits           2 months ago Rash and nonspecific skin eruption   [place] [facility] [place], [name], DO   2 months ago Cellulitis of left lower extremity   [place] [facility] [place], [name], DO   6 months ago Primary hypertension   [place] [facility] [place], [place], DO   7 months ago Primary hypertension   [place] [facility] [place], [name], DO   1 year ago Routine general medical examination at a health care facility   [facility], [name], DO

## 2023-01-03 NOTE — Telephone Encounter (Signed)
Called and scheduled patient's appointment for 01/06/2023 @ 4:00 pm.  States he took his last one this past Sunday.

## 2023-01-06 ENCOUNTER — Ambulatory Visit (INDEPENDENT_AMBULATORY_CARE_PROVIDER_SITE_OTHER): Payer: Medicare PPO | Admitting: Family Medicine

## 2023-01-06 ENCOUNTER — Encounter: Payer: Self-pay | Admitting: Family Medicine

## 2023-01-06 VITALS — BP 122/64 | HR 82 | Temp 98.9°F | Ht 69.0 in | Wt 220.4 lb

## 2023-01-06 DIAGNOSIS — E782 Mixed hyperlipidemia: Secondary | ICD-10-CM

## 2023-01-06 DIAGNOSIS — I700 Atherosclerosis of aorta: Secondary | ICD-10-CM | POA: Diagnosis not present

## 2023-01-06 DIAGNOSIS — I10 Essential (primary) hypertension: Secondary | ICD-10-CM

## 2023-01-06 DIAGNOSIS — I25118 Atherosclerotic heart disease of native coronary artery with other forms of angina pectoris: Secondary | ICD-10-CM

## 2023-01-06 DIAGNOSIS — N529 Male erectile dysfunction, unspecified: Secondary | ICD-10-CM

## 2023-01-06 DIAGNOSIS — Z720 Tobacco use: Secondary | ICD-10-CM

## 2023-01-06 DIAGNOSIS — Z1211 Encounter for screening for malignant neoplasm of colon: Secondary | ICD-10-CM

## 2023-01-06 DIAGNOSIS — R3911 Hesitancy of micturition: Secondary | ICD-10-CM | POA: Diagnosis not present

## 2023-01-06 DIAGNOSIS — B182 Chronic viral hepatitis C: Secondary | ICD-10-CM

## 2023-01-06 DIAGNOSIS — Z0000 Encounter for general adult medical examination without abnormal findings: Secondary | ICD-10-CM | POA: Diagnosis not present

## 2023-01-06 DIAGNOSIS — J449 Chronic obstructive pulmonary disease, unspecified: Secondary | ICD-10-CM

## 2023-01-06 HISTORY — DX: Mixed hyperlipidemia: E78.2

## 2023-01-06 LAB — URINALYSIS, ROUTINE W REFLEX MICROSCOPIC: Ketones, UA: NEGATIVE

## 2023-01-06 LAB — MICROALBUMIN, URINE WAIVED: Creatinine, Urine Waived: 50 mg/dL (ref 10–300)

## 2023-01-06 LAB — MICROSCOPIC EXAMINATION

## 2023-01-06 MED ORDER — SILDENAFIL CITRATE 20 MG PO TABS: ORAL_TABLET | ORAL | 4 refills | Status: DC

## 2023-01-06 MED ORDER — BENAZEPRIL HCL 40 MG PO TABS: 40.0000 mg | ORAL_TABLET | Freq: Every day | ORAL | 1 refills | Status: DC

## 2023-01-06 MED ORDER — VALACYCLOVIR HCL 500 MG PO TABS: 500.0000 mg | ORAL_TABLET | Freq: Two times a day (BID) | ORAL | 5 refills | Status: DC | PRN

## 2023-01-06 MED ORDER — AMLODIPINE BESYLATE 10 MG PO TABS: 10.0000 mg | ORAL_TABLET | Freq: Every day | ORAL | 1 refills | Status: DC

## 2023-01-06 MED ORDER — ROSUVASTATIN CALCIUM 5 MG PO TABS: 5.0000 mg | ORAL_TABLET | ORAL | 0 refills | Status: DC

## 2023-01-06 NOTE — Assessment & Plan Note (Signed)
Has not had his low dose CT this year. Current every day smoker. With 47.5 pack year history.

## 2023-01-06 NOTE — Assessment & Plan Note (Signed)
Under good control on current regimen. Continue current regimen. Continue to monitor. Call with any concerns. Refills given. Labs drawn today.   

## 2023-01-06 NOTE — Assessment & Plan Note (Signed)
Will keep BP and cholesterol under good control. Call with any concerns.  ?

## 2023-01-06 NOTE — Assessment & Plan Note (Signed)
Continue to follow with cardiology. Call with any concerns. Continue to monitor.  

## 2023-01-06 NOTE — Progress Notes (Signed)
BP 122/64   Pulse 82   Temp 98.9 F (37.2 C) (Oral)   Ht 5\' 9"  (1.753 m)   Wt 220 lb 6.4 oz (100 kg)   SpO2 97%   BMI 32.55 kg/m    Subjective:    Patient ID: Nathan Cooper, male    DOB: [birth year], 77 y.o.   MRN: [id number]  HPI: Nathan Cooper is a 77 y.o. male presenting on 01/06/2023 for comprehensive medical examination. Current medical complaints include:  HYPERTENSION / HYPERLIPIDEMIA Satisfied with current treatment? yes Duration of hypertension: chronic BP monitoring frequency: not checking BP medication side effects: no Past BP meds: amlodipine Duration of hyperlipidemia: chronic Cholesterol medication side effects: no Cholesterol supplements: none Past cholesterol medications: crestor Medication compliance: excellent compliance Aspirin: yes Recent stressors: no Recurrent headaches: no Visual changes: no Palpitations: no Dyspnea: no Chest pain: no Lower extremity edema: no Dizzy/lightheaded: no  COPD COPD status: controlled Satisfied with current treatment?: yes Oxygen use: no Dyspnea frequency: rarely Cough frequency: rarely Rescue inhaler frequency: rarely   Limitation of activity: no Productive cough: none Pneumovax: Up to Date Influenza: Up to Date  Interim Problems from his last visit: no  Depression Screen done today and results listed below:     11/02/2022    8:11 AM 10/18/2022    1:47 PM 06/24/2022   10:47 AM 05/27/2022    2:11 PM 04/21/2022   12:39 PM  Depression screen PHQ 2/9  Decreased Interest 0 0 0 0 0  Down, Depressed, Hopeless 0 0 0 0 0  PHQ - 2 Score 0 0 0 0 0  Altered sleeping 0 0 0 0 0  Tired, decreased energy 0 0 0 0 0  Change in appetite 0 0 0 0 0  Feeling bad or failure about yourself  0 0 0 0 0  Trouble concentrating 0 0 0 0 0  Moving slowly or fidgety/restless 0 0 0 0 0  Suicidal thoughts 0 0 0 0 0  PHQ-9 Score 0 0 0 0 0  Difficult doing work/chores  Not difficult at all Not difficult at all Not difficult  at all Not difficult at all    Past Medical History:  Past Medical History:  Diagnosis Date   COPD (chronic obstructive pulmonary disease) (HCC)    Coronary artery disease    Hepatitis C    Hernia, inguinal 2014   Hypertension 2009   IFG (impaired fasting glucose) 07/22/2015   Mixed hyperlipidemia 01/06/2023   Personal history of tobacco use, presenting hazards to health 08/26/2015   Sleep apnea     Surgical History:  Past Surgical History:  Procedure Laterality Date   COLONOSCOPY  2010   COLONOSCOPY WITH PROPOFOL N/A 09/21/2017   Procedure: COLONOSCOPY WITH PROPOFOL;  Surgeon: Pasty Spillers, MD;  Location: [facility] ENDOSCOPY;  Service: Endoscopy;  Laterality: N/A;   HERNIA REPAIR  2014    Medications:  Current Outpatient Medications on File Prior to Visit  Medication Sig   ASPIRIN LOW DOSE 81 MG tablet TAKE 1 TABLET (81 MG TOTAL) BY MOUTH DAILY. SWALLOW WHOLE.   clotrimazole-betamethasone (LOTRISONE) cream Apply 1 Application topically daily.   triamcinolone ointment (KENALOG) 0.5 % APPLY TO AFFECTED AREA TWICE A DAY   No current facility-administered medications on file prior to visit.    Allergies:  No Known Allergies  Social History:  Social History   Socioeconomic History   Marital status: Divorced    Spouse name: Not on file   Number  of children: Not on file   Years of education: Not on file   Highest education level: Associate degree: academic program  Occupational History   Not on file  Tobacco Use   Smoking status: Every Day    Packs/day: 1.00    Years: 47.50    Additional pack years: 0.00    Total pack years: 47.50    Types: Cigarettes   Smokeless tobacco: Never   Tobacco comments:    has had 5 since jan 2019  Vaping Use   Vaping Use: Some days   Start date: 08/28/2017   Devices: jewel  Substance and Sexual Activity   Alcohol use: No    Alcohol/week: 0.0 standard drinks of alcohol   Drug use: Not Currently   Sexual activity: Yes  Other Topics  Concern   Not on file  Social History Narrative   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   Owns legal share company   Peer support    23 year old daughter lives with him    Social Determinants of Health   Financial Resource Strain: Low Risk  (04/20/2021)   Overall Financial Resource Strain (CARDIA)    Difficulty of Paying Living Expenses: Not hard at all  Food Insecurity: No Food Insecurity (04/21/2022)   Hunger Vital Sign    Worried About Running Out of Food in the Last Year: Never true    Ran Out of Food in the Last Year: Never true  Transportation Needs: No Transportation Needs (04/20/2021)   PRAPARE - Administrator, Civil Service (Medical): No    Lack of Transportation (Non-Medical): No  Physical Activity: Inactive (04/20/2021)   Exercise Vital Sign    Days of Exercise per Week: 0 days    Minutes of Exercise per Session: 0 min  Stress: No Stress Concern Present (04/20/2021)   [employer] of Occupational Health - Occupational Stress Questionnaire    Feeling of Stress : Not at all  Social Connections: Socially Integrated (04/21/2022)   Social Connection and Isolation Panel [NHANES]    Frequency of Communication with Friends and Family: More than three times a week    Frequency of Social Gatherings with Friends and Family: More than three times a week    Attends Religious Services: More than 4 times per year    Active Member of [employer] or Organizations: Not on file    Attends Banker Meetings: More than 4 times per year    Marital Status: Married  Catering manager Violence: Not At Risk (04/21/2022)   Humiliation, Afraid, Rape, and Kick questionnaire    Fear of Current or Ex-Partner: No    Emotionally Abused: No    Physically Abused: No    Sexually Abused: No   Social History   Tobacco Use  Smoking Status Every Day   Packs/day: 1.00   Years: 47.50   Additional pack years: 0.00   Total pack years: 47.50   Types: Cigarettes  Smokeless Tobacco Never  Tobacco  Comments   has had 5 since jan 2019   Social History   Substance and Sexual Activity  Alcohol Use No   Alcohol/week: 0.0 standard drinks of alcohol    Family History:  Family History  Problem Relation Age of Onset   Hypertension Mother    Cirrhosis Father    Gout Son    Heart disease Maternal Uncle 50   Heart attack Maternal Uncle    Cancer Paternal Uncle    Diabetes Paternal Grandmother  Past medical history, surgical history, medications, allergies, family history and social history reviewed with patient today and changes made to appropriate areas of the chart.   Review of Systems  Constitutional: Negative.   HENT: Negative.    Eyes: Negative.   Respiratory: Negative.    Cardiovascular: Negative.   Gastrointestinal: Negative.   Genitourinary:  Positive for hematuria (with kidney stones, has some redness). Negative for dysuria, flank pain, frequency and urgency.  Musculoskeletal:  Positive for back pain, joint pain and myalgias. Negative for falls and neck pain.  Skin: Negative.   Neurological:  Positive for tingling (back and leg). Negative for dizziness, tremors, sensory change, speech change, focal weakness, seizures, loss of consciousness, weakness and headaches.  Endo/Heme/Allergies: Negative.   Psychiatric/Behavioral: Negative.     All other ROS negative except what is listed above and in the HPI.      Objective:    BP 122/64   Pulse 82   Temp 98.9 F (37.2 C) (Oral)   Ht 5\' 9"  (1.753 m)   Wt 220 lb 6.4 oz (100 kg)   SpO2 97%   BMI 32.55 kg/m   Wt Readings from Last 3 Encounters:  01/06/23 220 lb 6.4 oz (100 kg)  11/02/22 213 lb 14.4 oz (97 kg)  10/18/22 213 lb 4.8 oz (96.8 kg)    Physical Exam Vitals and nursing note reviewed.  Constitutional:      General: He is not in acute distress.    Appearance: Normal appearance. He is obese. He is not ill-appearing, toxic-appearing or diaphoretic.  HENT:     Head: Normocephalic and atraumatic.      Right Ear: Tympanic membrane, ear canal and external ear normal. There is no impacted cerumen.     Left Ear: Tympanic membrane, ear canal and external ear normal. There is no impacted cerumen.     Nose: Nose normal. No congestion or rhinorrhea.     Mouth/Throat:     Mouth: Mucous membranes are moist.     Pharynx: Oropharynx is clear. No oropharyngeal exudate or posterior oropharyngeal erythema.  Eyes:     General: No scleral icterus.       Right eye: No discharge.        Left eye: No discharge.     Extraocular Movements: Extraocular movements intact.     Conjunctiva/sclera: Conjunctivae normal.     Pupils: Pupils are equal, round, and reactive to light.  Neck:     Vascular: No carotid bruit.  Cardiovascular:     Rate and Rhythm: Normal rate and regular rhythm.     Pulses: Normal pulses.     Heart sounds: No murmur heard.    No friction rub. No gallop.  Pulmonary:     Effort: Pulmonary effort is normal. No respiratory distress.     Breath sounds: Normal breath sounds. No stridor. No wheezing, rhonchi or rales.  Chest:     Chest wall: No tenderness.  Abdominal:     General: Abdomen is flat. Bowel sounds are normal. There is no distension.     Palpations: Abdomen is soft. There is no mass.     Tenderness: There is no abdominal tenderness. There is no right CVA tenderness, left CVA tenderness, guarding or rebound.     Hernia: No hernia is present.  Genitourinary:    Comments: Genital exam deferred with shared decision making Musculoskeletal:        General: No swelling, tenderness, deformity or signs of injury.     Cervical  back: Normal range of motion and neck supple. No rigidity. No muscular tenderness.     Right lower leg: No edema.     Left lower leg: No edema.  Lymphadenopathy:     Cervical: No cervical adenopathy.  Skin:    General: Skin is warm and dry.     Capillary Refill: Capillary refill takes less than 2 seconds.     Coloration: Skin is not jaundiced or pale.      Findings: No bruising, erythema, lesion or rash.  Neurological:     General: No focal deficit present.     Mental Status: He is alert and oriented to person, place, and time.     Cranial Nerves: No cranial nerve deficit.     Sensory: No sensory deficit.     Motor: No weakness.     Coordination: Coordination normal.     Gait: Gait normal.     Deep Tendon Reflexes: Reflexes normal.  Psychiatric:        Mood and Affect: Mood normal.        Behavior: Behavior normal.        Thought Content: Thought content normal.        Judgment: Judgment normal.     Results for orders placed or performed in visit on 06/24/22  Basic metabolic panel  Result Value Ref Range   Glucose 93 70 - 99 mg/dL   BUN 14 8 - 27 mg/dL   Creatinine, Ser 1.61 0.76 - 1.27 mg/dL   eGFR 68 >09 UE/AVW/0.98   BUN/Creatinine Ratio 12 10 - 24   Sodium 136 134 - 144 mmol/L   Potassium 4.3 3.5 - 5.2 mmol/L   Chloride 101 96 - 106 mmol/L   CO2 20 20 - 29 mmol/L   Calcium 9.3 8.6 - 10.2 mg/dL      Assessment & Plan:   Problem List Items Addressed This Visit       Cardiovascular and Mediastinum   HTN (hypertension)    Under good control on current regimen. Continue current regimen. Continue to monitor. Call with any concerns. Refills given. Labs drawn today.        Relevant Medications   amLODipine (NORVASC) 10 MG tablet   benazepril (LOTENSIN) 40 MG tablet   rosuvastatin (CRESTOR) 5 MG tablet   sildenafil (REVATIO) 20 MG tablet   Other Relevant Orders   Comprehensive metabolic panel   CBC with Differential/Platelet   TSH   Urinalysis, Routine w reflex microscopic   Microalbumin, Urine Waived   CAD (coronary artery disease)    Continue to follow with cardiology. Call with any concerns. Continue to monitor.       Relevant Medications   amLODipine (NORVASC) 10 MG tablet   benazepril (LOTENSIN) 40 MG tablet   rosuvastatin (CRESTOR) 5 MG tablet   sildenafil (REVATIO) 20 MG tablet   Other Relevant Orders    Comprehensive metabolic panel   CBC with Differential/Platelet   Atherosclerosis of aorta (HCC)    Will keep BP and cholesterol under good control. Call with any concerns.       Relevant Medications   amLODipine (NORVASC) 10 MG tablet   benazepril (LOTENSIN) 40 MG tablet   rosuvastatin (CRESTOR) 5 MG tablet   sildenafil (REVATIO) 20 MG tablet   Other Relevant Orders   Comprehensive metabolic panel   CBC with Differential/Platelet   Lipid Panel w/o Chol/HDL Ratio     Respiratory   COPD (chronic obstructive pulmonary disease) (HCC)    Under  good control on current regimen. Continue current regimen. Continue to monitor. Call with any concerns. Refills given. Labs drawn today.       Relevant Orders   Comprehensive metabolic panel   CBC with Differential/Platelet     Digestive   Hep C w/o coma, chronic (HCC)    Consistently negative. S/p treatment. Call with any concerns.       Relevant Medications   valACYclovir (VALTREX) 500 MG tablet     Other   Tobacco abuse    Has not had his low dose CT this year. Current every day smoker. With 47.5 pack year history.       Relevant Orders   CT CHEST LUNG CA SCREEN LOW DOSE W/O CM   ED (erectile dysfunction)   Relevant Medications   sildenafil (REVATIO) 20 MG tablet   Mixed hyperlipidemia   Relevant Medications   amLODipine (NORVASC) 10 MG tablet   benazepril (LOTENSIN) 40 MG tablet   rosuvastatin (CRESTOR) 5 MG tablet   sildenafil (REVATIO) 20 MG tablet   Other Visit Diagnoses     Routine general medical examination at a health care facility    -  Primary   Vaccines up tod ate. Screening labs checked today. Cologuard ordered today. Continue diet and exercise. Call with any concerns.   Hesitancy       Labs drawn today. Await results. Treat as needed.   Relevant Orders   PSA   Screening for colon cancer       Referral to GI placed today.   Relevant Orders   Ambulatory referral to Gastroenterology        LABORATORY  TESTING:  Health maintenance labs ordered today as discussed above.   The natural history of prostate cancer and ongoing controversy regarding screening and potential treatment outcomes of prostate cancer has been discussed with the patient. The meaning of a false positive PSA and a false negative PSA has been discussed. He indicates understanding of the limitations of this screening test and wishes to proceed with screening PSA testing.   IMMUNIZATIONS:   - Tdap: Tetanus vaccination status reviewed: last tetanus booster within 10 years. - Influenza: Postponed to flu season - Pneumovax: Up to date - Prevnar: Up to date - COVID: Up to date - HPV: Not applicable - Shingrix vaccine: Given elsewhere  SCREENING: - Colonoscopy: Ordered today  Discussed with patient purpose of the colonoscopy is to detect colon cancer at curable precancerous or early stages   PATIENT COUNSELING:    Sexuality: Discussed sexually transmitted diseases, partner selection, use of condoms, avoidance of unintended pregnancy  and contraceptive alternatives.   Advised to avoid cigarette smoking.  I discussed with the patient that most people either abstain from alcohol or drink within safe limits (<=14/week and <=4 drinks/occasion for males, <=7/weeks and <= 3 drinks/occasion for females) and that the risk for alcohol disorders and other health effects rises proportionally with the number of drinks per week and how often a drinker exceeds daily limits.  Discussed cessation/primary prevention of drug use and availability of treatment for abuse.   Diet: Encouraged to adjust caloric intake to maintain  or achieve ideal body weight, to reduce intake of dietary saturated fat and total fat, to limit sodium intake by avoiding high sodium foods and not adding table salt, and to maintain adequate dietary potassium and calcium preferably from fresh fruits, vegetables, and low-fat dairy products.    stressed the importance of  regular exercise  Injury prevention: Discussed safety  belts, safety helmets, smoke detector, smoking near bedding or upholstery.   Dental health: Discussed importance of regular tooth brushing, flossing, and dental visits.   Follow up plan: NEXT PREVENTATIVE PHYSICAL DUE IN 1 YEAR. Return in about 6 months (around 07/08/2023).

## 2023-01-06 NOTE — Assessment & Plan Note (Signed)
Consistently negative. S/p treatment. Call with any concerns.

## 2023-01-07 ENCOUNTER — Other Ambulatory Visit: Payer: Self-pay | Admitting: Family Medicine

## 2023-01-07 DIAGNOSIS — R310 Gross hematuria: Secondary | ICD-10-CM

## 2023-01-07 LAB — URINALYSIS, ROUTINE W REFLEX MICROSCOPIC
Bilirubin, UA: NEGATIVE
Glucose, UA: NEGATIVE
Leukocytes,UA: NEGATIVE
Nitrite, UA: NEGATIVE
Protein,UA: NEGATIVE
Specific Gravity, UA: 1.01 (ref 1.005–1.030)
Urobilinogen, Ur: 1 mg/dL (ref 0.2–1.0)
pH, UA: 5.5 (ref 5.0–7.5)

## 2023-01-07 LAB — LIPID PANEL W/O CHOL/HDL RATIO
Cholesterol, Total: 142 mg/dL (ref 100–199)
HDL: 55 mg/dL (ref >39)
LDL Chol Calc (NIH): 72 mg/dL (ref 0–99)
Triglycerides: 80 mg/dL (ref 0–149)
VLDL Cholesterol Cal: 15 mg/dL (ref 5–40)

## 2023-01-07 LAB — CBC WITH DIFFERENTIAL/PLATELET
Basophils Absolute: 0 10*3/uL (ref 0.0–0.2)
Basos: 1 %
EOS (ABSOLUTE): 0.3 10*3/uL (ref 0.0–0.4)
Eos: 4 %
Hematocrit: 39.7 % (ref 37.5–51.0)
Hemoglobin: 13.3 g/dL (ref 13.0–17.7)
Immature Grans (Abs): 0 10*3/uL (ref 0.0–0.1)
Immature Granulocytes: 0 %
Lymphocytes Absolute: 1.5 10*3/uL (ref 0.7–3.1)
Lymphs: 20 %
MCH: 30.9 pg (ref 26.6–33.0)
MCHC: 33.5 g/dL (ref 31.5–35.7)
MCV: 92 fL (ref 79–97)
Monocytes Absolute: 0.7 10*3/uL (ref 0.1–0.9)
Monocytes: 9 %
Neutrophils Absolute: 4.9 10*3/uL (ref 1.4–7.0)
Neutrophils: 66 %
Platelets: 275 10*3/uL (ref 150–450)
RBC: 4.3 x10E6/uL (ref 4.14–5.80)
RDW: 13.7 % (ref 11.6–15.4)
WBC: 7.4 10*3/uL (ref 3.4–10.8)

## 2023-01-07 LAB — COMPREHENSIVE METABOLIC PANEL
ALT: 12 IU/L (ref 0–44)
AST: 17 IU/L (ref 0–40)
Albumin: 4.5 g/dL (ref 3.8–4.8)
Alkaline Phosphatase: 67 IU/L (ref 44–121)
BUN/Creatinine Ratio: 12 (ref 10–24)
BUN: 13 mg/dL (ref 8–27)
Bilirubin Total: 0.3 mg/dL (ref 0.0–1.2)
CO2: 22 mmol/L (ref 20–29)
Calcium: 9.1 mg/dL (ref 8.6–10.2)
Chloride: 104 mmol/L (ref 96–106)
Creatinine, Ser: 1.09 mg/dL (ref 0.76–1.27)
Globulin, Total: 2.5 g/dL (ref 1.5–4.5)
Glucose: 137 mg/dL — ABNORMAL HIGH (ref 70–99)
Potassium: 4 mmol/L (ref 3.5–5.2)
Sodium: 138 mmol/L (ref 134–144)
Total Protein: 7 g/dL (ref 6.0–8.5)
eGFR: 71 mL/min/{1.73_m2} (ref >59)

## 2023-01-07 LAB — PSA: Prostate Specific Ag, Serum: 1.7 ng/mL (ref 0.0–4.0)

## 2023-01-07 LAB — MICROALBUMIN, URINE WAIVED: Microalb, Ur Waived: 10 mg/L (ref 0–19)

## 2023-01-07 LAB — MICROSCOPIC EXAMINATION
Bacteria, UA: NONE SEEN
Epithelial Cells (non renal): NONE SEEN /HPF (ref 0–10)

## 2023-01-07 LAB — TSH: TSH: 0.884 u[IU]/mL (ref 0.450–4.500)

## 2023-01-14 ENCOUNTER — Encounter: Payer: Self-pay | Admitting: *Deleted

## 2023-03-03 ENCOUNTER — Other Ambulatory Visit: Payer: Self-pay

## 2023-03-03 DIAGNOSIS — Z122 Encounter for screening for malignant neoplasm of respiratory organs: Secondary | ICD-10-CM

## 2023-03-03 DIAGNOSIS — Z87891 Personal history of nicotine dependence: Secondary | ICD-10-CM

## 2023-03-03 DIAGNOSIS — F17210 Nicotine dependence, cigarettes, uncomplicated: Secondary | ICD-10-CM

## 2023-04-12 ENCOUNTER — Ambulatory Visit
Admission: RE | Admit: 2023-04-12 | Discharge: 2023-04-12 | Disposition: A | Discharge status: Home / Self Care | Payer: Medicare PPO | Source: Ambulatory Visit | Attending: Acute Care | Admitting: Acute Care

## 2023-04-12 DIAGNOSIS — Z122 Encounter for screening for malignant neoplasm of respiratory organs: Secondary | ICD-10-CM | POA: Insufficient documentation

## 2023-04-12 DIAGNOSIS — F17210 Nicotine dependence, cigarettes, uncomplicated: Secondary | ICD-10-CM | POA: Insufficient documentation

## 2023-04-12 DIAGNOSIS — Z87891 Personal history of nicotine dependence: Secondary | ICD-10-CM | POA: Insufficient documentation

## 2023-04-25 ENCOUNTER — Other Ambulatory Visit: Payer: Self-pay | Admitting: Acute Care

## 2023-04-25 DIAGNOSIS — Z122 Encounter for screening for malignant neoplasm of respiratory organs: Secondary | ICD-10-CM

## 2023-04-25 DIAGNOSIS — Z87891 Personal history of nicotine dependence: Secondary | ICD-10-CM

## 2023-04-25 DIAGNOSIS — F17210 Nicotine dependence, cigarettes, uncomplicated: Secondary | ICD-10-CM

## 2023-06-05 ENCOUNTER — Other Ambulatory Visit: Payer: Self-pay | Admitting: Family Medicine

## 2023-06-05 DIAGNOSIS — I10 Essential (primary) hypertension: Secondary | ICD-10-CM

## 2023-06-06 NOTE — Telephone Encounter (Signed)
Requested medication (s) are due for refill today: expired medication  Requested medication (s) are on the active medication list: yes   Last refill:  05/11/22 #120 3 refills  Future visit scheduled: yes in 1 month  Notes to clinic:  expired medication . Do you want to renew Rx?     Requested Prescriptions  Pending Prescriptions Disp Refills   ASPIRIN LOW DOSE 81 MG tablet [Pharmacy Med Name: ASPIRIN EC 81 MG TABLET] 120 tablet 3    Sig: TAKE 1 TABLET (81 MG TOTAL) BY MOUTH DAILY. SWALLOW WHOLE.     Analgesics:  NSAIDS - aspirin Passed - 06/05/2023  8:35 AM      Passed - Cr in normal range and within 360 days    Creatinine, Ser  Date Value Ref Range Status  01/06/2023 1.09 0.76 - 1.27 mg/dL Final         Passed - eGFR is 10 or above and within 360 days    GFR calc Af Amer  Date Value Ref Range Status  09/11/2020 82 >59 mL/min/1.73 Final    Comment:    **In accordance with recommendations from the NKF-ASN Task force,**   [facility] is in the process of updating its eGFR calculation to the   2021 CKD-EPI creatinine equation that estimates kidney function   without a race variable.    GFR calc non Af Amer  Date Value Ref Range Status  09/11/2020 71 >59 mL/min/1.73 Final   eGFR  Date Value Ref Range Status  01/06/2023 71 >59 mL/min/1.73 Final         Passed - Patient is not pregnant      Passed - Valid encounter within last 12 months    Recent Outpatient Visits           5 months ago Routine general medical examination at a health care facility   [facility], [name], DO   7 months ago Rash and nonspecific skin eruption   [place] [facility] [place], [name], DO   7 months ago Cellulitis of left lower extremity   [place] [facility] [place], [name], DO   11 months ago Primary hypertension   [facility] [place], [place], DO   1 year ago Primary hypertension   [place]  [facility] [place], [place], DO       Future Appointments             In 1 month Johnson, [name], DO [place] [facility]            Refused Prescriptions Disp Refills   amLODipine (NORVASC) 10 MG tablet [Pharmacy Med Name: AMLODIPINE BESYLATE 10 MG TAB] 90 tablet 1    Sig: TAKE 1 TABLET BY MOUTH EVERY DAY     Cardiovascular: Calcium Channel Blockers 2 Passed - 06/05/2023  8:35 AM      Passed - Last BP in normal range    BP Readings from Last 1 Encounters:  01/06/23 122/64         Passed - Last Heart Rate in normal range    Pulse Readings from Last 1 Encounters:  01/06/23 82         Passed - Valid encounter within last 6 months    Recent Outpatient Visits           5 months ago Routine general medical examination at a health care facility  [facility] [facility] [place], [place] P, DO   7 months ago Rash and nonspecific skin eruption   [place] [facility] [place], [place] P, DO   7 months ago Cellulitis of left lower extremity   [place] [facility] [place], [place], DO   11 months ago Primary hypertension   [place] [facility] [employer], [place], DO   1 year ago Primary hypertension   [place] [facility] [place], Oralia Rud, DO       Future Appointments             In 1 month Johnson, Oralia Rud, DO [place] [facility]

## 2023-06-06 NOTE — Telephone Encounter (Signed)
Requested by interface surescripts. Last refill 05/18/23 #90 1 refill. Requested too soon . Future visit in 1 month. Requested Prescriptions  Pending Prescriptions Disp Refills   ASPIRIN LOW DOSE 81 MG tablet [Pharmacy Med Name: ASPIRIN EC 81 MG TABLET] 120 tablet 3    Sig: TAKE 1 TABLET (81 MG TOTAL) BY MOUTH DAILY. SWALLOW WHOLE.     Analgesics:  NSAIDS - aspirin Passed - 06/05/2023  8:35 AM      Passed - Cr in normal range and within 360 days    Creatinine, Ser  Date Value Ref Range Status  01/06/2023 1.09 0.76 - 1.27 mg/dL Final         Passed - eGFR is 10 or above and within 360 days    GFR calc Af Amer  Date Value Ref Range Status  09/11/2020 82 >59 mL/min/1.73 Final    Comment:    **In accordance with recommendations from the NKF-ASN Task force,**   [facility] is in the process of updating its eGFR calculation to the   2021 CKD-EPI creatinine equation that estimates kidney function   without a race variable.    GFR calc non Af Amer  Date Value Ref Range Status  09/11/2020 71 >59 mL/min/1.73 Final   eGFR  Date Value Ref Range Status  01/06/2023 71 >59 mL/min/1.73 Final         Passed - Patient is not pregnant      Passed - Valid encounter within last 12 months    Recent Outpatient Visits           5 months ago Routine general medical examination at a health care facility   [facility], [name], DO   7 months ago Rash and nonspecific skin eruption   [place] [facility] [place], [name], DO   7 months ago Cellulitis of left lower extremity   [place] [facility] [place], [name], DO   11 months ago Primary hypertension   [place] [facility] [place], [place], DO   1 year ago Primary hypertension   [place] [facility] [place], [place], DO       Future Appointments             In 1 month Johnson, [name], DO [place] [facility]             Refused Prescriptions Disp Refills   amLODipine (NORVASC) 10 MG tablet [Pharmacy Med Name: AMLODIPINE BESYLATE 10 MG TAB] 90 tablet 1    Sig: TAKE 1 TABLET BY MOUTH EVERY DAY     Cardiovascular: Calcium Channel Blockers 2 Passed - 06/05/2023  8:35 AM      Passed - Last BP in normal range    BP Readings from Last 1 Encounters:  01/06/23 122/64         Passed - Last Heart Rate in normal range    Pulse Readings from Last 1 Encounters:  01/06/23 82         Passed - Valid encounter within last 6 months    Recent Outpatient Visits           5 months ago Routine general medical examination at a health care facility   [facility], [place] P, DO   7 months ago Rash and nonspecific skin eruption   [place] [facility] [place], [place] P, DO   7 months ago Cellulitis of  left lower extremity   [place] [facility] [place], [place], [place]   11 months ago Primary hypertension   [facility] [place], [place], DO   1 year ago Primary hypertension   [place] [facility] [place], Oralia Rud, DO       Future Appointments             In 1 month Johnson, Oralia Rud, DO [place] [facility]

## 2023-07-08 ENCOUNTER — Encounter: Payer: Self-pay | Admitting: Family Medicine

## 2023-07-08 ENCOUNTER — Ambulatory Visit (INDEPENDENT_AMBULATORY_CARE_PROVIDER_SITE_OTHER): Payer: Medicare PPO | Admitting: Family Medicine

## 2023-07-08 VITALS — BP 136/72 | HR 94 | Temp 98.3°F | Ht 70.0 in | Wt 217.0 lb

## 2023-07-08 DIAGNOSIS — Z0000 Encounter for general adult medical examination without abnormal findings: Secondary | ICD-10-CM | POA: Diagnosis not present

## 2023-07-08 DIAGNOSIS — I10 Essential (primary) hypertension: Secondary | ICD-10-CM

## 2023-07-08 DIAGNOSIS — E782 Mixed hyperlipidemia: Secondary | ICD-10-CM

## 2023-07-08 DIAGNOSIS — Z23 Encounter for immunization: Secondary | ICD-10-CM | POA: Diagnosis not present

## 2023-07-08 MED ORDER — AMLODIPINE BESYLATE 10 MG PO TABS: 10.0000 mg | ORAL_TABLET | Freq: Every day | ORAL | 1 refills | Status: DC

## 2023-07-08 MED ORDER — VALACYCLOVIR HCL 500 MG PO TABS: 500.0000 mg | ORAL_TABLET | Freq: Two times a day (BID) | ORAL | 5 refills | Status: DC | PRN

## 2023-07-08 MED ORDER — BENAZEPRIL HCL 40 MG PO TABS: 40.0000 mg | ORAL_TABLET | Freq: Every day | ORAL | 1 refills | Status: DC

## 2023-07-08 MED ORDER — CLOTRIMAZOLE-BETAMETHASONE 1-0.05 % EX CREA: 1.0000 | TOPICAL_CREAM | Freq: Every day | CUTANEOUS | 3 refills | Status: DC

## 2023-07-08 NOTE — Progress Notes (Signed)
Ht 5\' 10"  (1.778 m)   Wt 217 lb (98.4 kg)   BMI 31.14 kg/m    Subjective:    Patient ID: Nathan Cooper, male    DOB: Sep 01, 1945, 77 y.o.   MRN: [id number]  HPI: Nathan Cooper is a 77 y.o. male  Chief Complaint  Patient presents with   6 month follow up    No concerns today    Medication Refill    Relevant past medical, surgical, family and social history reviewed and updated as indicated. Interim medical history since our last visit reviewed. Allergies and medications reviewed and updated.  Review of Systems  Per HPI unless specifically indicated above     Objective:    Ht 5\' 10"  (1.778 m)   Wt 217 lb (98.4 kg)   BMI 31.14 kg/m   Wt Readings from Last 3 Encounters:  07/08/23 217 lb (98.4 kg)  01/06/23 220 lb 6.4 oz (100 kg)  11/02/22 213 lb 14.4 oz (97 kg)    Physical Exam  Results for orders placed or performed in visit on 01/06/23  Microscopic Examination   Collection Time: 01/06/23  4:08 PM   Urine  Result Value Ref Range   WBC, UA 0-5 0 - 5 /hpf   RBC, Urine 11-30 (A) 0 - 2 /hpf   Epithelial Cells (non renal) None seen 0 - 10 /hpf   Bacteria, UA None seen None seen/Few  Urinalysis, Routine w reflex microscopic   Collection Time: 01/06/23  4:08 PM  Result Value Ref Range   Specific Gravity, UA 1.010 1.005 - 1.030   pH, UA 5.5 5.0 - 7.5   Color, UA Yellow Yellow   Appearance Ur Clear Clear   Leukocytes,UA Negative Negative   Protein,UA Negative Negative/Trace   Glucose, UA Negative Negative   Ketones, UA Negative Negative   RBC, UA 3+ (A) Negative   Bilirubin, UA Negative Negative   Urobilinogen, Ur 1.0 0.2 - 1.0 mg/dL   Nitrite, UA Negative Negative   Microscopic Examination See below:   Microalbumin, Urine Waived   Collection Time: 01/06/23  4:08 PM  Result Value Ref Range   Microalb, Ur Waived 10 0 - 19 mg/L   Creatinine, Urine Waived 50 10 - 300 mg/dL   Microalb/Creat Ratio 30-300 (H) <30 mg/g  Comprehensive metabolic panel    Collection Time: 01/06/23  4:10 PM  Result Value Ref Range   Glucose 137 (H) 70 - 99 mg/dL   BUN 13 8 - 27 mg/dL   Creatinine, Ser 8.46 0.76 - 1.27 mg/dL   eGFR 71 >96 EX/BMW/4.13   BUN/Creatinine Ratio 12 10 - 24   Sodium 138 134 - 144 mmol/L   Potassium 4.0 3.5 - 5.2 mmol/L   Chloride 104 96 - 106 mmol/L   CO2 22 20 - 29 mmol/L   Calcium 9.1 8.6 - 10.2 mg/dL   Total Protein 7.0 6.0 - 8.5 g/dL   Albumin 4.5 3.8 - 4.8 g/dL   Globulin, Total 2.5 1.5 - 4.5 g/dL   Bilirubin Total 0.3 0.0 - 1.2 mg/dL   Alkaline Phosphatase 67 44 - 121 IU/L   AST 17 0 - 40 IU/L   ALT 12 0 - 44 IU/L  CBC with Differential/Platelet   Collection Time: 01/06/23  4:10 PM  Result Value Ref Range   WBC 7.4 3.4 - 10.8 x10E3/uL   RBC 4.30 4.14 - 5.80 x10E6/uL   Hemoglobin 13.3 13.0 - 17.7 g/dL   Hematocrit 24.4 01.0 -  51.0 %   MCV 92 79 - 97 fL   MCH 30.9 26.6 - 33.0 pg   MCHC 33.5 31.5 - 35.7 g/dL   RDW 16.1 09.6 - 04.5 %   Platelets 275 150 - 450 x10E3/uL   Neutrophils 66 Not Estab. %   Lymphs 20 Not Estab. %   Monocytes 9 Not Estab. %   Eos 4 Not Estab. %   Basos 1 Not Estab. %   Neutrophils Absolute 4.9 1.4 - 7.0 x10E3/uL   Lymphocytes Absolute 1.5 0.7 - 3.1 x10E3/uL   Monocytes Absolute 0.7 0.1 - 0.9 x10E3/uL   EOS (ABSOLUTE) 0.3 0.0 - 0.4 x10E3/uL   Basophils Absolute 0.0 0.0 - 0.2 x10E3/uL   Immature Granulocytes 0 Not Estab. %   Immature Grans (Abs) 0.0 0.0 - 0.1 x10E3/uL  Lipid Panel w/o Chol/HDL Ratio   Collection Time: 01/06/23  4:10 PM  Result Value Ref Range   Cholesterol, Total 142 100 - 199 mg/dL   Triglycerides 80 0 - 149 mg/dL   HDL 55 >40 mg/dL   VLDL Cholesterol Cal 15 5 - 40 mg/dL   LDL Chol Calc (NIH) 72 0 - 99 mg/dL  PSA   Collection Time: 01/06/23  4:10 PM  Result Value Ref Range   Prostate Specific Ag, Serum 1.7 0.0 - 4.0 ng/mL  TSH   Collection Time: 01/06/23  4:10 PM  Result Value Ref Range   TSH 0.884 0.450 - 4.500 uIU/mL      Assessment & Plan:   Problem  List Items Addressed This Visit       Cardiovascular and Mediastinum   HTN (hypertension) - Primary     Other   Mixed hyperlipidemia     Follow up plan: No follow-ups on file.

## 2023-07-08 NOTE — Progress Notes (Signed)
BP 136/72   Pulse 94   Temp 98.3 F (36.8 C) (Oral)   Ht 5\' 10"  (1.778 m)   Wt 217 lb (98.4 kg)   SpO2 95%   BMI 31.14 kg/m    Subjective:    Patient ID: Nathan Cooper, male    DOB: Jul 16, 1946, 77 y.o.   MRN: [id number]  HPI: Nathan Cooper is a 77 y.o. male presenting on 07/08/2023 for comprehensive medical examination. Current medical complaints include:  HYPERTENSION / HYPERLIPIDEMIA Satisfied with current treatment? yes Duration of hypertension: chronic BP monitoring frequency: not checking BP medication side effects: no Past BP meds: amlodipine, losartan Duration of hyperlipidemia: chronic Cholesterol medication side effects: no Cholesterol supplements: none Past cholesterol medications: crestor Medication compliance: excellent compliance Aspirin: yes Recent stressors: no Recurrent headaches: no Visual changes: no Palpitations: no Dyspnea: no Chest pain: no Lower extremity edema: no Dizzy/lightheaded: no  Interim Problems from his last visit: no  Functional Status Survey: Is the patient deaf or have difficulty hearing?: No Does the patient have difficulty seeing, even when wearing glasses/contacts?: No Does the patient have difficulty concentrating, remembering, or making decisions?: No Does the patient have difficulty walking or climbing stairs?: No Does the patient have difficulty dressing or bathing?: No Does the patient have difficulty doing errands alone such as visiting a doctor's office or shopping?: No  FALL RISK:    11/02/2022    8:11 AM 10/18/2022    1:46 PM 06/24/2022   10:47 AM 05/27/2022    2:11 PM 04/21/2022   12:00 PM  Fall Risk   Falls in the past year? 0 0 0 0 0  Number falls in past yr: 0 0 0 0 0  Injury with Fall? 0 0 0 0 0  Risk for fall due to : No Fall Risks No Fall Risks No Fall Risks No Fall Risks   Follow up Falls evaluation completed Falls evaluation completed Falls evaluation completed Falls evaluation completed Falls  evaluation completed;Education provided;Falls prevention discussed    Depression Screen    11/02/2022    8:11 AM 10/18/2022    1:47 PM 06/24/2022   10:47 AM 05/27/2022    2:11 PM 04/21/2022   12:39 PM  Depression screen PHQ 2/9  Decreased Interest 0 0 0 0 0  Down, Depressed, Hopeless 0 0 0 0 0  PHQ - 2 Score 0 0 0 0 0  Altered sleeping 0 0 0 0 0  Tired, decreased energy 0 0 0 0 0  Change in appetite 0 0 0 0 0  Feeling bad or failure about yourself  0 0 0 0 0  Trouble concentrating 0 0 0 0 0  Moving slowly or fidgety/restless 0 0 0 0 0  Suicidal thoughts 0 0 0 0 0  PHQ-9 Score 0 0 0 0 0  Difficult doing work/chores  Not difficult at all Not difficult at all Not difficult at all Not difficult at all    Advanced Directives Does patient have a HCPOA?    yes If yes, name and contact information:  Does patient have a living will or MOST form?  yes  Past Medical History:  Past Medical History:  Diagnosis Date   COPD (chronic obstructive pulmonary disease) (HCC)    Coronary artery disease    Hepatitis C    Hernia, inguinal 2014   Hypertension 2009   IFG (impaired fasting glucose) 07/22/2015   Mixed hyperlipidemia 01/06/2023   Personal history of tobacco use, presenting hazards  to health 08/26/2015   Sleep apnea     Surgical History:  Past Surgical History:  Procedure Laterality Date   COLONOSCOPY  2010   COLONOSCOPY WITH PROPOFOL N/A 09/21/2017   Procedure: COLONOSCOPY WITH PROPOFOL;  Surgeon: Pasty Spillers, MD;  Location: [facility] ENDOSCOPY;  Service: Endoscopy;  Laterality: N/A;   HERNIA REPAIR  2014    Medications:  Current Outpatient Medications on File Prior to Visit  Medication Sig   ASPIRIN LOW DOSE 81 MG tablet TAKE 1 TABLET (81 MG TOTAL) BY MOUTH DAILY. SWALLOW WHOLE.   rosuvastatin (CRESTOR) 5 MG tablet Take 1 tablet (5 mg total) by mouth once a week.   sildenafil (REVATIO) 20 MG tablet TAKE 1-5 TABLETS BY MOUTH 30 MINUTES PRIOR TO INTERCOURSE   triamcinolone  ointment (KENALOG) 0.5 % APPLY TO AFFECTED AREA TWICE A DAY   No current facility-administered medications on file prior to visit.    Allergies:  No Known Allergies  Social History:  Social History   Socioeconomic History   Marital status: Divorced    Spouse name: Not on file   Number of children: Not on file   Years of education: Not on file   Highest education level: Associate degree: academic program  Occupational History   Not on file  Tobacco Use   Smoking status: Every Day    Current packs/day: 1.00    Average packs/day: 1 pack/day for 47.5 years (47.5 ttl pk-yrs)    Types: Cigarettes   Smokeless tobacco: Never   Tobacco comments:    has had 5 since jan 2019  Vaping Use   Vaping status: Some Days   Start date: 08/28/2017   Devices: jewel  Substance and Sexual Activity   Alcohol use: No    Alcohol/week: 0.0 standard drinks of alcohol   Drug use: Not Currently   Sexual activity: Yes  Other Topics Concern   Not on file  Social History Narrative   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   Owns legal share company   Peer support    48 year old daughter lives with him    Social Drivers of Corporate investment banker Strain: Low Risk  (04/20/2021)   Overall Financial Resource Strain (CARDIA)    Difficulty of Paying Living Expenses: Not hard at all  Food Insecurity: No Food Insecurity (04/21/2022)   Hunger Vital Sign    Worried About Running Out of Food in the Last Year: Never true    Ran Out of Food in the Last Year: Never true  Transportation Needs: No Transportation Needs (04/20/2021)   PRAPARE - Administrator, Civil Service (Medical): No    Lack of Transportation (Non-Medical): No  Physical Activity: Inactive (04/20/2021)   Exercise Vital Sign    Days of Exercise per Week: 0 days    Minutes of Exercise per Session: 0 min  Stress: No Stress Concern Present (04/20/2021)   [employer] of Occupational Health - Occupational Stress Questionnaire    Feeling of Stress :  Not at all  Social Connections: Socially Integrated (04/21/2022)   Social Connection and Isolation Panel [NHANES]    Frequency of Communication with Friends and Family: More than three times a week    Frequency of Social Gatherings with Friends and Family: More than three times a week    Attends Religious Services: More than 4 times per year    Active Member of [employer] or Organizations: Not on file    Attends Banker Meetings:  More than 4 times per year    Marital Status: Married  Catering manager Violence: Not At Risk (04/21/2022)   Humiliation, Afraid, Rape, and Kick questionnaire    Fear of Current or Ex-Partner: No    Emotionally Abused: No    Physically Abused: No    Sexually Abused: No   Social History   Tobacco Use  Smoking Status Every Day   Current packs/day: 1.00   Average packs/day: 1 pack/day for 47.5 years (47.5 ttl pk-yrs)   Types: Cigarettes  Smokeless Tobacco Never  Tobacco Comments   has had 5 since jan 2019   Social History   Substance and Sexual Activity  Alcohol Use No   Alcohol/week: 0.0 standard drinks of alcohol    Family History:  Family History  Problem Relation Age of Onset   Hypertension Mother    Cirrhosis Father    Gout Son    Heart disease Maternal Uncle 50   Heart attack Maternal Uncle    Cancer Paternal Uncle    Diabetes Paternal Grandmother     Past medical history, surgical history, medications, allergies, family history and social history reviewed with patient today and changes made to appropriate areas of the chart.   Review of Systems  Constitutional: Negative.   Respiratory: Negative.    Cardiovascular: Negative.   Musculoskeletal: Negative.   Neurological: Negative.   Psychiatric/Behavioral: Negative.     All other ROS negative except what is listed above and in the HPI.      Objective:    BP 136/72   Pulse 94   Temp 98.3 F (36.8 C) (Oral)   Ht 5\' 10"  (1.778 m)   Wt 217 lb (98.4 kg)   SpO2 95%   BMI  31.14 kg/m   Wt Readings from Last 3 Encounters:  07/08/23 217 lb (98.4 kg)  01/06/23 220 lb 6.4 oz (100 kg)  11/02/22 213 lb 14.4 oz (97 kg)    No results found.  Physical Exam Vitals and nursing note reviewed.  Constitutional:      General: He is not in acute distress.    Appearance: Normal appearance. He is not ill-appearing, toxic-appearing or diaphoretic.  HENT:     Head: Normocephalic and atraumatic.     Right Ear: External ear normal.     Left Ear: External ear normal.     Nose: Nose normal.     Mouth/Throat:     Mouth: Mucous membranes are moist.     Pharynx: Oropharynx is clear.  Eyes:     General: No scleral icterus.       Right eye: No discharge.        Left eye: No discharge.     Extraocular Movements: Extraocular movements intact.     Conjunctiva/sclera: Conjunctivae normal.     Pupils: Pupils are equal, round, and reactive to light.  Cardiovascular:     Rate and Rhythm: Normal rate and regular rhythm.     Pulses: Normal pulses.     Heart sounds: Normal heart sounds. No murmur heard.    No friction rub. No gallop.  Pulmonary:     Effort: Pulmonary effort is normal. No respiratory distress.     Breath sounds: Normal breath sounds. No stridor. No wheezing, rhonchi or rales.  Chest:     Chest wall: No tenderness.  Musculoskeletal:        General: Normal range of motion.     Cervical back: Normal range of motion and neck supple.  Skin:  General: Skin is warm and dry.     Capillary Refill: Capillary refill takes less than 2 seconds.     Coloration: Skin is not jaundiced or pale.     Findings: No bruising, erythema, lesion or rash.  Neurological:     General: No focal deficit present.     Mental Status: He is alert and oriented to person, place, and time. Mental status is at baseline.  Psychiatric:        Mood and Affect: Mood normal.        Behavior: Behavior normal.        Thought Content: Thought content normal.        Judgment: Judgment normal.         07/08/2023    4:22 PM 04/21/2022   12:01 PM 04/20/2021    3:34 PM 09/03/2019    1:23 PM 08/28/2018    1:40 PM  6CIT Screen  What Year? 0 points 0 points 0 points 0 points 0 points  What month? 0 points 0 points 0 points 0 points 0 points  What time? 0 points 0 points 0 points 0 points 0 points  Count back from 20 0 points 0 points 0 points 0 points 0 points  Months in reverse 0 points 0 points 0 points 0 points 0 points  Repeat phrase 0 points 0 points 0 points 0 points 0 points  Total Score 0 points 0 points 0 points 0 points 0 points    Results for orders placed or performed in visit on 01/06/23  Microscopic Examination   Collection Time: 01/06/23  4:08 PM   Urine  Result Value Ref Range   WBC, UA 0-5 0 - 5 /hpf   RBC, Urine 11-30 (A) 0 - 2 /hpf   Epithelial Cells (non renal) None seen 0 - 10 /hpf   Bacteria, UA None seen None seen/Few  Urinalysis, Routine w reflex microscopic   Collection Time: 01/06/23  4:08 PM  Result Value Ref Range   Specific Gravity, UA 1.010 1.005 - 1.030   pH, UA 5.5 5.0 - 7.5   Color, UA Yellow Yellow   Appearance Ur Clear Clear   Leukocytes,UA Negative Negative   Protein,UA Negative Negative/Trace   Glucose, UA Negative Negative   Ketones, UA Negative Negative   RBC, UA 3+ (A) Negative   Bilirubin, UA Negative Negative   Urobilinogen, Ur 1.0 0.2 - 1.0 mg/dL   Nitrite, UA Negative Negative   Microscopic Examination See below:   Microalbumin, Urine Waived   Collection Time: 01/06/23  4:08 PM  Result Value Ref Range   Microalb, Ur Waived 10 0 - 19 mg/L   Creatinine, Urine Waived 50 10 - 300 mg/dL   Microalb/Creat Ratio 30-300 (H) <30 mg/g  Comprehensive metabolic panel   Collection Time: 01/06/23  4:10 PM  Result Value Ref Range   Glucose 137 (H) 70 - 99 mg/dL   BUN 13 8 - 27 mg/dL   Creatinine, Ser 1.61 0.76 - 1.27 mg/dL   eGFR 71 >09 UE/AVW/0.98   BUN/Creatinine Ratio 12 10 - 24   Sodium 138 134 - 144 mmol/L   Potassium 4.0  3.5 - 5.2 mmol/L   Chloride 104 96 - 106 mmol/L   CO2 22 20 - 29 mmol/L   Calcium 9.1 8.6 - 10.2 mg/dL   Total Protein 7.0 6.0 - 8.5 g/dL   Albumin 4.5 3.8 - 4.8 g/dL   Globulin, Total 2.5 1.5 - 4.5 g/dL  Bilirubin Total 0.3 0.0 - 1.2 mg/dL   Alkaline Phosphatase 67 44 - 121 IU/L   AST 17 0 - 40 IU/L   ALT 12 0 - 44 IU/L  CBC with Differential/Platelet   Collection Time: 01/06/23  4:10 PM  Result Value Ref Range   WBC 7.4 3.4 - 10.8 x10E3/uL   RBC 4.30 4.14 - 5.80 x10E6/uL   Hemoglobin 13.3 13.0 - 17.7 g/dL   Hematocrit 16.1 09.6 - 51.0 %   MCV 92 79 - 97 fL   MCH 30.9 26.6 - 33.0 pg   MCHC 33.5 31.5 - 35.7 g/dL   RDW 04.5 40.9 - 81.1 %   Platelets 275 150 - 450 x10E3/uL   Neutrophils 66 Not Estab. %   Lymphs 20 Not Estab. %   Monocytes 9 Not Estab. %   Eos 4 Not Estab. %   Basos 1 Not Estab. %   Neutrophils Absolute 4.9 1.4 - 7.0 x10E3/uL   Lymphocytes Absolute 1.5 0.7 - 3.1 x10E3/uL   Monocytes Absolute 0.7 0.1 - 0.9 x10E3/uL   EOS (ABSOLUTE) 0.3 0.0 - 0.4 x10E3/uL   Basophils Absolute 0.0 0.0 - 0.2 x10E3/uL   Immature Granulocytes 0 Not Estab. %   Immature Grans (Abs) 0.0 0.0 - 0.1 x10E3/uL  Lipid Panel w/o Chol/HDL Ratio   Collection Time: 01/06/23  4:10 PM  Result Value Ref Range   Cholesterol, Total 142 100 - 199 mg/dL   Triglycerides 80 0 - 149 mg/dL   HDL 55 >91 mg/dL   VLDL Cholesterol Cal 15 5 - 40 mg/dL   LDL Chol Calc (NIH) 72 0 - 99 mg/dL  PSA   Collection Time: 01/06/23  4:10 PM  Result Value Ref Range   Prostate Specific Ag, Serum 1.7 0.0 - 4.0 ng/mL  TSH   Collection Time: 01/06/23  4:10 PM  Result Value Ref Range   TSH 0.884 0.450 - 4.500 uIU/mL      Assessment & Plan:   Problem List Items Addressed This Visit       Cardiovascular and Mediastinum   HTN (hypertension)   Better on recheck. Continue current regimen. Continue to monitor. Call with any concerns.       Relevant Medications   amLODipine (NORVASC) 10 MG tablet   benazepril  (LOTENSIN) 40 MG tablet   Other Relevant Orders   CBC with Differential/Platelet   Comprehensive metabolic panel     Other   Mixed hyperlipidemia   Under good control on current regimen. Continue current regimen. Continue to monitor. Call with any concerns. Refills given. Labs drawn today.        Relevant Medications   amLODipine (NORVASC) 10 MG tablet   benazepril (LOTENSIN) 40 MG tablet   Other Relevant Orders   CBC with Differential/Platelet   Comprehensive metabolic panel   Lipid Panel w/o Chol/HDL Ratio   Other Visit Diagnoses       Encounter for Medicare annual wellness exam    -  Primary   Preventative care discussed today as below.     Needs flu shot       Flu shot given today.   Relevant Orders   Flu Vaccine Trivalent High Dose (Fluad) (Completed)     Need for COVID-19 vaccine       Covid shot given today.   Relevant Orders   Pfizer Comirnaty Covid -19 Vaccine 31yrs and older (Completed)        Preventative Services:  Health Risk Assessment and Personalized Prevention  Plan: Done today Bone Mass Measurements: N/A CVD Screening: Done today Colon Cancer Screening: declined Depression Screening: done today Diabetes Screening: done today Glaucoma Screening: see your eye doctor Hepatitis B vaccine: N/A Hepatitis C screening: Up to date HIV Screening: up to date Flu Vaccine: Done today Lung cancer Screening: N/A Obesity Screening: done today Pneumonia Vaccines (2): up to date STI Screening: N/A PSA screening: done today  Discussed aspirin prophylaxis for myocardial infarction prevention and decision was made to continue ASA  LABORATORY TESTING:  Health maintenance labs ordered today as discussed above.   The natural history of prostate cancer and ongoing controversy regarding screening and potential treatment outcomes of prostate cancer has been discussed with the patient. The meaning of a false positive PSA and a false negative PSA has been discussed. He  indicates understanding of the limitations of this screening test and wishes to proceed with screening PSA testing.   IMMUNIZATIONS:   - Tdap: Tetanus vaccination status reviewed: last tetanus booster within 10 years. - Influenza: Administered today - Pneumovax: Up to date - Prevnar: Up to date - Zostavax vaccine: Refused  SCREENING: - Colonoscopy: Refused  Discussed with patient purpose of the colonoscopy is to detect colon cancer at curable precancerous or early stages   PATIENT COUNSELING:    Sexuality: Discussed sexually transmitted diseases, partner selection, use of condoms, avoidance of unintended pregnancy  and contraceptive alternatives.   Advised to avoid cigarette smoking.  I discussed with the patient that most people either abstain from alcohol or drink within safe limits (<=14/week and <=4 drinks/occasion for males, <=7/weeks and <= 3 drinks/occasion for females) and that the risk for alcohol disorders and other health effects rises proportionally with the number of drinks per week and how often a drinker exceeds daily limits.  Discussed cessation/primary prevention of drug use and availability of treatment for abuse.   Diet: Encouraged to adjust caloric intake to maintain  or achieve ideal body weight, to reduce intake of dietary saturated fat and total fat, to limit sodium intake by avoiding high sodium foods and not adding table salt, and to maintain adequate dietary potassium and calcium preferably from fresh fruits, vegetables, and low-fat dairy products.    stressed the importance of regular exercise  Injury prevention: Discussed safety belts, safety helmets, smoke detector, smoking near bedding or upholstery.   Dental health: Discussed importance of regular tooth brushing, flossing, and dental visits.   Follow up plan: NEXT PREVENTATIVE PHYSICAL DUE IN 1 YEAR. Return in about 6 months (around 01/06/2024) for physical.

## 2023-07-08 NOTE — Patient Instructions (Signed)
Preventative Services:  Health Risk Assessment and Personalized Prevention Plan: Done today Bone Mass Measurements: N/A CVD Screening: Done today Colon Cancer Screening: declined Depression Screening: done today Diabetes Screening: done today Glaucoma Screening: see your eye doctor Hepatitis B vaccine: N/A Hepatitis C screening: Up to date HIV Screening: up to date Flu Vaccine: Done today Lung cancer Screening: N/A Obesity Screening: done today Pneumonia Vaccines (2): up to date STI Screening: N/A PSA screening: done today

## 2023-07-08 NOTE — Assessment & Plan Note (Signed)
Under good control on current regimen. Continue current regimen. Continue to monitor. Call with any concerns. Refills given. Labs drawn today.   

## 2023-07-08 NOTE — Assessment & Plan Note (Addendum)
Better on recheck. Continue current regimen. Continue to monitor. Call with any concerns.  

## 2023-07-09 LAB — CBC WITH DIFFERENTIAL/PLATELET
Basophils Absolute: 0 10*3/uL (ref 0.0–0.2)
Basos: 0 %
EOS (ABSOLUTE): 0.1 10*3/uL (ref 0.0–0.4)
Eos: 1 %
Hematocrit: 42.2 % (ref 37.5–51.0)
Hemoglobin: 14.1 g/dL (ref 13.0–17.7)
Immature Grans (Abs): 0 10*3/uL (ref 0.0–0.1)
Immature Granulocytes: 0 %
Lymphocytes Absolute: 1.4 10*3/uL (ref 0.7–3.1)
Lymphs: 14 %
MCH: 30.7 pg (ref 26.6–33.0)
MCHC: 33.4 g/dL (ref 31.5–35.7)
MCV: 92 fL (ref 79–97)
Monocytes Absolute: 1.1 10*3/uL — ABNORMAL HIGH (ref 0.1–0.9)
Monocytes: 11 %
Neutrophils Absolute: 7.2 10*3/uL — ABNORMAL HIGH (ref 1.4–7.0)
Neutrophils: 74 %
Platelets: 290 10*3/uL (ref 150–450)
RBC: 4.59 x10E6/uL (ref 4.14–5.80)
RDW: 13.3 % (ref 11.6–15.4)
WBC: 9.9 10*3/uL (ref 3.4–10.8)

## 2023-07-09 LAB — COMPREHENSIVE METABOLIC PANEL
ALT: 8 [IU]/L (ref 0–44)
AST: 15 [IU]/L (ref 0–40)
Albumin: 4.6 g/dL (ref 3.8–4.8)
Alkaline Phosphatase: 70 [IU]/L (ref 44–121)
BUN/Creatinine Ratio: 11 (ref 10–24)
BUN: 14 mg/dL (ref 8–27)
Bilirubin Total: 0.4 mg/dL (ref 0.0–1.2)
CO2: 21 mmol/L (ref 20–29)
Calcium: 9.2 mg/dL (ref 8.6–10.2)
Chloride: 106 mmol/L (ref 96–106)
Creatinine, Ser: 1.22 mg/dL (ref 0.76–1.27)
Globulin, Total: 2.7 g/dL (ref 1.5–4.5)
Glucose: 106 mg/dL — ABNORMAL HIGH (ref 70–99)
Potassium: 4.1 mmol/L (ref 3.5–5.2)
Sodium: 142 mmol/L (ref 134–144)
Total Protein: 7.3 g/dL (ref 6.0–8.5)
eGFR: 62 mL/min/{1.73_m2} (ref >59)

## 2023-07-09 LAB — LIPID PANEL W/O CHOL/HDL RATIO
Cholesterol, Total: 150 mg/dL (ref 100–199)
HDL: 51 mg/dL (ref >39)
LDL Chol Calc (NIH): 79 mg/dL (ref 0–99)
Triglycerides: 109 mg/dL (ref 0–149)
VLDL Cholesterol Cal: 20 mg/dL (ref 5–40)

## 2023-07-19 ENCOUNTER — Ambulatory Visit (INDEPENDENT_AMBULATORY_CARE_PROVIDER_SITE_OTHER): Payer: Medicare PPO | Admitting: Nurse Practitioner

## 2023-07-19 ENCOUNTER — Ambulatory Visit: Payer: Self-pay

## 2023-07-19 ENCOUNTER — Encounter: Payer: Self-pay | Admitting: Nurse Practitioner

## 2023-07-19 VITALS — BP 137/74 | HR 81 | Temp 98.3°F | Ht 70.0 in | Wt 214.6 lb

## 2023-07-19 DIAGNOSIS — L259 Unspecified contact dermatitis, unspecified cause: Secondary | ICD-10-CM | POA: Diagnosis not present

## 2023-07-19 MED ORDER — METHYLPREDNISOLONE SODIUM SUCC 40 MG IJ SOLR
40.0000 mg | Freq: Once | INTRAMUSCULAR | Status: AC
  Administered 2023-07-19: 40 mg via INTRAMUSCULAR

## 2023-07-19 MED ORDER — PREDNISONE 10 MG PO TABS: 10.0000 mg | ORAL_TABLET | Freq: Every day | ORAL | 0 refills | Status: DC

## 2023-07-19 MED ORDER — TRIAMCINOLONE ACETONIDE 0.5 % EX OINT: TOPICAL_OINTMENT | CUTANEOUS | 1 refills | Status: AC

## 2023-07-19 NOTE — Telephone Encounter (Signed)
  Chief Complaint: 3 open sores on back.  Symptoms: itching, pus Frequency: stated a few weeks back Pertinent Negatives: Patient denies fever Disposition: [] ED /[] Urgent Care (no appt availability in office) / [x] Appointment(In office/virtual)/ []  [facility]/ [] Home Care/ [] Refused Recommended Disposition /[] [place]/ []  Follow-up with PCP Additional Notes: Pt states that itchiness had started before last seen in office. Itching became much more intense and pt used a metal back scratcher on his back. Pt states he stared developing moles a few years back and these started as moles.  Now pt has 3 fairly large wounds on his back.   Summary: Possible infection in his back.   Pt called reporting that he has scratched his back too hard with a back scratcher and now has wounds that he believes are infected. Wants to be seen soon but nothing is available soon enough. No longer bleeding but has scabs that secrete puss.  Also reports severe itching on his back.     Reason for Disposition  [1] Pus or cloudy fluid draining from wound AND [2] no fever  Answer Assessment - Initial Assessment Questions 1. LOCATION: Where is the wound located?      On his back 2. WOUND APPEARANCE: What does the wound look like?       3 wounds - 2 the size of a 50 cent piece, and one the size of a quarter 3. SIZE: If redness is present, ask: What is the size of the red area? (Inches, centimeters, or compare to size of a coin)      no 4. SPREAD: What's changed in the last day?  Do you see any red streaks coming from the wound?     pus from scratched area 5. ONSET: When did it start to look infected?      2 weeks ago light itch 9. OTHER SYMPTOMS: Do you have any other symptoms? (e.g., shaking chills, weakness, rash elsewhere on body)     no  Protocols used: Wound Infection-A-AH

## 2023-07-19 NOTE — Progress Notes (Signed)
 BP 137/74 (BP Location: Left Arm, Patient Position: Sitting, Cuff Size: Large)   Pulse 81   Temp 98.3 F (36.8 C) (Oral)   Ht 5' 10 (1.778 m)   Wt 214 lb 9.6 oz (97.3 kg)   SpO2 96%   BMI 30.79 kg/m    Subjective:    Patient ID: Nathan Cooper, male    DOB: [birth year], 77 y.o.   MRN: [id number]  HPI: Nathan Cooper is a 77 y.o. male  Chief Complaint  Patient presents with   Pruritis   nummular dermatitis     Possible. Has been on the upper back for about 1 week. Prior treatment has OTC neosporin and aquaphor   Patient states he has been having some itching on his back for a couple of weeks but has worsened over the last week.  He has been using a metal back scratcher but it broke the skin on his back.  States he noticed pus on his shirt. Denies any fevers, changes to soap lotion or detergent.  Denies any pain. Denies SOB or difficulty swallowing.     Relevant past medical, surgical, family and social history reviewed and updated as indicated. Interim medical history since our last visit reviewed. Allergies and medications reviewed and updated.  Review of Systems  Skin:  Positive for rash.    Per HPI unless specifically indicated above     Objective:    BP 137/74 (BP Location: Left Arm, Patient Position: Sitting, Cuff Size: Large)   Pulse 81   Temp 98.3 F (36.8 C) (Oral)   Ht 5' 10 (1.778 m)   Wt 214 lb 9.6 oz (97.3 kg)   SpO2 96%   BMI 30.79 kg/m   Wt Readings from Last 3 Encounters:  07/19/23 214 lb 9.6 oz (97.3 kg)  07/08/23 217 lb (98.4 kg)  01/06/23 220 lb 6.4 oz (100 kg)    Physical Exam Vitals and nursing note reviewed.  Constitutional:      General: He is not in acute distress.    Appearance: Normal appearance. He is not ill-appearing, toxic-appearing or diaphoretic.  HENT:     Head: Normocephalic.     Right Ear: External ear normal.     Left Ear: External ear normal.     Nose: Nose normal. No congestion or rhinorrhea.      Mouth/Throat:     Mouth: Mucous membranes are moist.  Eyes:     General:        Right eye: No discharge.        Left eye: No discharge.     Extraocular Movements: Extraocular movements intact.     Conjunctiva/sclera: Conjunctivae normal.     Pupils: Pupils are equal, round, and reactive to light.  Cardiovascular:     Rate and Rhythm: Normal rate and regular rhythm.     Heart sounds: No murmur heard. Pulmonary:     Effort: Pulmonary effort is normal. No respiratory distress.     Breath sounds: Normal breath sounds. No wheezing, rhonchi or rales.  Abdominal:     General: Abdomen is flat. Bowel sounds are normal.  Musculoskeletal:     Cervical back: Normal range of motion and neck supple.  Skin:    General: Skin is warm and dry.     Capillary Refill: Capillary refill takes less than 2 seconds.       Neurological:     General: No focal deficit present.     Mental Status: He is alert  and oriented to person, place, and time.  Psychiatric:        Mood and Affect: Mood normal.        Behavior: Behavior normal.        Thought Content: Thought content normal.        Judgment: Judgment normal.     Results for orders placed or performed in visit on 07/08/23  CBC with Differential/Platelet   Collection Time: 07/08/23  4:10 PM  Result Value Ref Range   WBC 9.9 3.4 - 10.8 x10E3/uL   RBC 4.59 4.14 - 5.80 x10E6/uL   Hemoglobin 14.1 13.0 - 17.7 g/dL   Hematocrit 57.7 62.4 - 51.0 %   MCV 92 79 - 97 fL   MCH 30.7 26.6 - 33.0 pg   MCHC 33.4 31.5 - 35.7 g/dL   RDW 86.6 88.3 - 84.5 %   Platelets 290 150 - 450 x10E3/uL   Neutrophils 74 Not Estab. %   Lymphs 14 Not Estab. %   Monocytes 11 Not Estab. %   Eos 1 Not Estab. %   Basos 0 Not Estab. %   Neutrophils Absolute 7.2 (H) 1.4 - 7.0 x10E3/uL   Lymphocytes Absolute 1.4 0.7 - 3.1 x10E3/uL   Monocytes Absolute 1.1 (H) 0.1 - 0.9 x10E3/uL   EOS (ABSOLUTE) 0.1 0.0 - 0.4 x10E3/uL   Basophils Absolute 0.0 0.0 - 0.2 x10E3/uL   Immature  Granulocytes 0 Not Estab. %   Immature Grans (Abs) 0.0 0.0 - 0.1 x10E3/uL  Comprehensive metabolic panel   Collection Time: 07/08/23  4:10 PM  Result Value Ref Range   Glucose 106 (H) 70 - 99 mg/dL   BUN 14 8 - 27 mg/dL   Creatinine, Ser 8.77 0.76 - 1.27 mg/dL   eGFR 62 >40 fO/fpw/8.26   BUN/Creatinine Ratio 11 10 - 24   Sodium 142 134 - 144 mmol/L   Potassium 4.1 3.5 - 5.2 mmol/L   Chloride 106 96 - 106 mmol/L   CO2 21 20 - 29 mmol/L   Calcium  9.2 8.6 - 10.2 mg/dL   Total Protein 7.3 6.0 - 8.5 g/dL   Albumin 4.6 3.8 - 4.8 g/dL   Globulin, Total 2.7 1.5 - 4.5 g/dL   Bilirubin Total 0.4 0.0 - 1.2 mg/dL   Alkaline Phosphatase 70 44 - 121 IU/L   AST 15 0 - 40 IU/L   ALT 8 0 - 44 IU/L  Lipid Panel w/o Chol/HDL Ratio   Collection Time: 07/08/23  4:10 PM  Result Value Ref Range   Cholesterol, Total 150 100 - 199 mg/dL   Triglycerides 890 0 - 149 mg/dL   HDL 51 >60 mg/dL   VLDL Cholesterol Cal 20 5 - 40 mg/dL   LDL Chol Calc (NIH) 79 0 - 99 mg/dL      Assessment & Plan:   Problem List Items Addressed This Visit   None Visit Diagnoses       Contact dermatitis, unspecified contact dermatitis type, unspecified trigger    -  Primary   Will give solumedrol in office due to symptoms. Start oral prednisone  on thursday. Use triamcinalone PRN for symptoms. Follow up if not improved.   Relevant Medications   methylPREDNISolone  sodium succinate (SOLU-MEDROL ) 40 mg/mL injection 40 mg        Follow up plan: No follow-ups on file.

## 2023-12-15 ENCOUNTER — Other Ambulatory Visit: Payer: Self-pay | Admitting: Family Medicine

## 2023-12-15 DIAGNOSIS — I10 Essential (primary) hypertension: Secondary | ICD-10-CM

## 2023-12-16 NOTE — Telephone Encounter (Signed)
 Requested Prescriptions  Pending Prescriptions Disp Refills   amLODipine  (NORVASC ) 10 MG tablet [Pharmacy Med Name: AMLODIPINE  BESYLATE 10 MG TAB] 90 tablet 0    Sig: TAKE 1 TABLET BY MOUTH EVERY DAY     Cardiovascular: Calcium  Channel Blockers 2 Failed - 12/16/2023  9:25 PM      Failed - Valid encounter within last 6 months    Recent Outpatient Visits   None     Future Appointments             In 3 weeks Johnson, [name], DO [facility]            Passed - Last BP in normal range    BP Readings from Last 1 Encounters:  07/19/23 137/74         Passed - Last Heart Rate in normal range    Pulse Readings from Last 1 Encounters:  07/19/23 81

## 2024-01-12 ENCOUNTER — Encounter: Payer: Self-pay | Admitting: Family Medicine

## 2024-01-24 ENCOUNTER — Other Ambulatory Visit: Payer: Self-pay | Admitting: Family Medicine

## 2024-01-24 DIAGNOSIS — N529 Male erectile dysfunction, unspecified: Secondary | ICD-10-CM

## 2024-01-26 NOTE — Telephone Encounter (Signed)
 Requested Prescriptions  Pending Prescriptions Disp Refills   sildenafil  (REVATIO ) 20 MG tablet [Pharmacy Med Name: SILDENAFIL  CITRATE 20 MG TAB] 270 tablet 0    Sig: TAKE 1 TO 5 TABLETS BY MOUTH 30 MINUTES PRIOR TO INTERCOURSE     Urology: Erectile Dysfunction Agents Failed - 01/26/2024  2:01 PM      Failed - Valid encounter within last 12 months    Recent Outpatient Visits   None            Passed - AST in normal range and within 360 days    AST  Date Value Ref Range Status  07/08/2023 15 0 - 40 IU/L Final         Passed - ALT in normal range and within 360 days    ALT  Date Value Ref Range Status  07/08/2023 8 0 - 44 IU/L Final         Passed - Last BP in normal range    BP Readings from Last 1 Encounters:  07/19/23 137/74

## 2024-02-05 ENCOUNTER — Other Ambulatory Visit: Payer: Self-pay | Admitting: Family Medicine

## 2024-02-07 ENCOUNTER — Ambulatory Visit (INDEPENDENT_AMBULATORY_CARE_PROVIDER_SITE_OTHER): Admitting: Family Medicine

## 2024-02-07 ENCOUNTER — Encounter: Payer: Self-pay | Admitting: Family Medicine

## 2024-02-07 VITALS — BP 131/69 | HR 85 | Temp 98.0°F | Ht 70.0 in | Wt 217.0 lb

## 2024-02-07 DIAGNOSIS — B353 Tinea pedis: Secondary | ICD-10-CM

## 2024-02-07 DIAGNOSIS — N529 Male erectile dysfunction, unspecified: Secondary | ICD-10-CM

## 2024-02-07 DIAGNOSIS — I10 Essential (primary) hypertension: Secondary | ICD-10-CM

## 2024-02-07 DIAGNOSIS — R310 Gross hematuria: Secondary | ICD-10-CM | POA: Diagnosis not present

## 2024-02-07 DIAGNOSIS — E782 Mixed hyperlipidemia: Secondary | ICD-10-CM

## 2024-02-07 LAB — MICROSCOPIC EXAMINATION
Bacteria, UA: NONE SEEN
Epithelial Cells (non renal): NONE SEEN /HPF (ref 0–10)
WBC, UA: NONE SEEN /HPF (ref 0–5)

## 2024-02-07 LAB — URINALYSIS, ROUTINE W REFLEX MICROSCOPIC
Bilirubin, UA: NEGATIVE
Glucose, UA: NEGATIVE
Ketones, UA: NEGATIVE
Leukocytes,UA: NEGATIVE
Nitrite, UA: NEGATIVE
Protein,UA: NEGATIVE
Specific Gravity, UA: 1.025 (ref 1.005–1.030)
Urobilinogen, Ur: 1 mg/dL (ref 0.2–1.0)
pH, UA: 5.5 (ref 5.0–7.5)

## 2024-02-07 MED ORDER — NYSTATIN-TRIAMCINOLONE 100000-0.1 UNIT/GM-% EX OINT: 1.0000 | TOPICAL_OINTMENT | Freq: Two times a day (BID) | CUTANEOUS | 0 refills | Status: DC

## 2024-02-07 MED ORDER — BENAZEPRIL HCL 40 MG PO TABS: 40.0000 mg | ORAL_TABLET | Freq: Every day | ORAL | 1 refills | Status: DC

## 2024-02-07 MED ORDER — SILDENAFIL CITRATE 20 MG PO TABS: ORAL_TABLET | ORAL | 0 refills | Status: DC

## 2024-02-07 MED ORDER — AMLODIPINE BESYLATE 10 MG PO TABS: 10.0000 mg | ORAL_TABLET | Freq: Every day | ORAL | 1 refills | Status: DC

## 2024-02-07 MED ORDER — ROSUVASTATIN CALCIUM 5 MG PO TABS: 5.0000 mg | ORAL_TABLET | ORAL | 0 refills | Status: DC

## 2024-02-07 MED ORDER — VALACYCLOVIR HCL 500 MG PO TABS: 500.0000 mg | ORAL_TABLET | Freq: Two times a day (BID) | ORAL | 5 refills | Status: DC | PRN

## 2024-02-07 NOTE — Assessment & Plan Note (Signed)
 Under good control on current regimen. Continue current regimen. Continue to monitor. Call with any concerns. Refills given. Labs drawn today.

## 2024-02-07 NOTE — Telephone Encounter (Signed)
 Refilled today in a separate encounter. Will refuse this request.  Requested Prescriptions  Pending Prescriptions Disp Refills   rosuvastatin  (CRESTOR ) 5 MG tablet [Pharmacy Med Name: ROSUVASTATIN  CALCIUM  5 MG TAB] 52 tablet 0    Sig: TAKE 1 TABLET BY MOUTH ONCE A WEEK.     Cardiovascular:  Antilipid - Statins 2 Failed - 02/07/2024 11:41 AM      Failed - Lipid Panel in normal range within the last 12 months    Cholesterol, Total  Date Value Ref Range Status  07/08/2023 150 100 - 199 mg/dL Final   LDL Chol Calc (NIH)  Date Value Ref Range Status  07/08/2023 79 0 - 99 mg/dL Final   HDL  Date Value Ref Range Status  07/08/2023 51 >39 mg/dL Final   Triglycerides  Date Value Ref Range Status  07/08/2023 109 0 - 149 mg/dL Final         Passed - Cr in normal range and within 360 days    Creatinine, Ser  Date Value Ref Range Status  07/08/2023 1.22 0.76 - 1.27 mg/dL Final         Passed - Patient is not pregnant      Passed - Valid encounter within last 12 months    Recent Outpatient Visits           Today Mixed hyperlipidemia   [facility] [place], Duwaine SQUIBB, DO       Future Appointments             In 6 days Francisca, Redell BROCKS, MD [facility] [place]

## 2024-02-07 NOTE — Progress Notes (Signed)
 BP 131/69   Pulse 85   Temp 98 F (36.7 C) (Oral)   Ht 5' 10 (1.778 m)   Wt 217 lb (98.4 kg)   SpO2 97%   BMI 31.14 kg/m    Subjective:    Patient ID: Nathan Cooper, male    DOB: 05-16-1946, 78 y.o.   MRN: [id number]  HPI: Nathan Cooper is a 78 y.o. male  Chief Complaint  Patient presents with   Hypertension   Hyperlipidemia   HYPERTENSION / HYPERLIPIDEMIA Satisfied with current treatment? no Duration of hypertension: chronic BP monitoring frequency: not checking BP range:  BP medication side effects: no Past BP meds: amlodipine , benazepril  Duration of hyperlipidemia: chronic Cholesterol medication side effects: no Cholesterol supplements: none Past cholesterol medications: crestor  Medication compliance: excellent compliance Aspirin : yes Recent stressors: no Recurrent headaches: no Visual changes: no Palpitations: no Dyspnea: no Chest pain: no Lower extremity edema: no Dizzy/lightheaded: no  URINARY SYMPTOMS Duration: off and on for 6+ months Dysuria: no Urinary frequency: no Urgency: yes Small volume voids: no Symptom severity: minor Urinary incontinence: no Foul odor: no Hematuria: yes Abdominal pain: no Back pain: no Suprapubic pain/pressure: no Flank pain: no Fever:  no Vomiting: no Relief with cranberry juice: no Relief with pyridium: no Status: fluctuating Previous urinary tract infection: no Recurrent urinary tract infection: no History of sexually transmitted disease: no Penile discharge: no Treatments attempted: none    Relevant past medical, surgical, family and social history reviewed and updated as indicated. Interim medical history since our last visit reviewed. Allergies and medications reviewed and updated.  Review of Systems  Constitutional: Negative.   Respiratory: Negative.    Cardiovascular: Negative.   Genitourinary:  Positive for hematuria. Negative for decreased urine volume, difficulty urinating,  dysuria, enuresis, flank pain, frequency, genital sores, penile discharge, penile pain, penile swelling, scrotal swelling, testicular pain and urgency.  Musculoskeletal: Negative.   Skin:  Positive for rash. Negative for color change, pallor and wound.  Psychiatric/Behavioral: Negative.      Per HPI unless specifically indicated above     Objective:    BP 131/69   Pulse 85   Temp 98 F (36.7 C) (Oral)   Ht 5' 10 (1.778 m)   Wt 217 lb (98.4 kg)   SpO2 97%   BMI 31.14 kg/m   Wt Readings from Last 3 Encounters:  02/07/24 217 lb (98.4 kg)  07/19/23 214 lb 9.6 oz (97.3 kg)  07/08/23 217 lb (98.4 kg)    Physical Exam Vitals and nursing note reviewed.  Constitutional:      General: He is not in acute distress.    Appearance: Normal appearance. He is not ill-appearing, toxic-appearing or diaphoretic.  HENT:     Head: Normocephalic and atraumatic.     Right Ear: External ear normal.     Left Ear: External ear normal.     Nose: Nose normal.     Mouth/Throat:     Mouth: Mucous membranes are moist.     Pharynx: Oropharynx is clear.  Eyes:     General: No scleral icterus.       Right eye: No discharge.        Left eye: No discharge.     Extraocular Movements: Extraocular movements intact.     Conjunctiva/sclera: Conjunctivae normal.     Pupils: Pupils are equal, round, and reactive to light.  Cardiovascular:     Rate and Rhythm: Normal rate and regular rhythm.     Pulses:  Normal pulses.     Heart sounds: Normal heart sounds. No murmur heard.    No friction rub. No gallop.  Pulmonary:     Effort: Pulmonary effort is normal. No respiratory distress.     Breath sounds: Normal breath sounds. No stridor. No wheezing, rhonchi or rales.  Chest:     Chest wall: No tenderness.  Musculoskeletal:        General: Normal range of motion.     Cervical back: Normal range of motion and neck supple.  Skin:    General: Skin is warm and dry.     Capillary Refill: Capillary refill takes  less than 2 seconds.     Coloration: Skin is not jaundiced or pale.     Findings: Rash (well demarcated red rash on top of R foot) present. No bruising, erythema or lesion.  Neurological:     General: No focal deficit present.     Mental Status: He is alert and oriented to person, place, and time. Mental status is at baseline.  Psychiatric:        Mood and Affect: Mood normal.        Behavior: Behavior normal.        Thought Content: Thought content normal.        Judgment: Judgment normal.     Results for orders placed or performed in visit on 07/08/23  CBC with Differential/Platelet   Collection Time: 07/08/23  4:10 PM  Result Value Ref Range   WBC 9.9 3.4 - 10.8 x10E3/uL   RBC 4.59 4.14 - 5.80 x10E6/uL   Hemoglobin 14.1 13.0 - 17.7 g/dL   Hematocrit 57.7 62.4 - 51.0 %   MCV 92 79 - 97 fL   MCH 30.7 26.6 - 33.0 pg   MCHC 33.4 31.5 - 35.7 g/dL   RDW 86.6 88.3 - 84.5 %   Platelets 290 150 - 450 x10E3/uL   Neutrophils 74 Not Estab. %   Lymphs 14 Not Estab. %   Monocytes 11 Not Estab. %   Eos 1 Not Estab. %   Basos 0 Not Estab. %   Neutrophils Absolute 7.2 (H) 1.4 - 7.0 x10E3/uL   Lymphocytes Absolute 1.4 0.7 - 3.1 x10E3/uL   Monocytes Absolute 1.1 (H) 0.1 - 0.9 x10E3/uL   EOS (ABSOLUTE) 0.1 0.0 - 0.4 x10E3/uL   Basophils Absolute 0.0 0.0 - 0.2 x10E3/uL   Immature Granulocytes 0 Not Estab. %   Immature Grans (Abs) 0.0 0.0 - 0.1 x10E3/uL  Comprehensive metabolic panel   Collection Time: 07/08/23  4:10 PM  Result Value Ref Range   Glucose 106 (H) 70 - 99 mg/dL   BUN 14 8 - 27 mg/dL   Creatinine, Ser 8.77 0.76 - 1.27 mg/dL   eGFR 62 >40 fO/fpw/8.26   BUN/Creatinine Ratio 11 10 - 24   Sodium 142 134 - 144 mmol/L   Potassium 4.1 3.5 - 5.2 mmol/L   Chloride 106 96 - 106 mmol/L   CO2 21 20 - 29 mmol/L   Calcium  9.2 8.6 - 10.2 mg/dL   Total Protein 7.3 6.0 - 8.5 g/dL   Albumin 4.6 3.8 - 4.8 g/dL   Globulin, Total 2.7 1.5 - 4.5 g/dL   Bilirubin Total 0.4 0.0 - 1.2 mg/dL    Alkaline Phosphatase 70 44 - 121 IU/L   AST 15 0 - 40 IU/L   ALT 8 0 - 44 IU/L  Lipid Panel w/o Chol/HDL Ratio   Collection Time: 07/08/23  4:10 PM  Result Value Ref Range   Cholesterol, Total 150 100 - 199 mg/dL   Triglycerides 890 0 - 149 mg/dL   HDL 51 >60 mg/dL   VLDL Cholesterol Cal 20 5 - 40 mg/dL   LDL Chol Calc (NIH) 79 0 - 99 mg/dL      Assessment & Plan:   Problem List Items Addressed This Visit       Cardiovascular and Mediastinum   HTN (hypertension)   Under good control on current regimen. Continue current regimen. Continue to monitor. Call with any concerns. Refills given. Labs drawn today.        Relevant Medications   rosuvastatin  (CRESTOR ) 5 MG tablet   amLODipine  (NORVASC ) 10 MG tablet   benazepril  (LOTENSIN ) 40 MG tablet   sildenafil  (REVATIO ) 20 MG tablet   Other Relevant Orders   CBC with Differential/Platelet   Comprehensive metabolic panel with GFR     Other   ED (erectile dysfunction)   Relevant Medications   sildenafil  (REVATIO ) 20 MG tablet   Mixed hyperlipidemia - Primary   Under good control on current regimen. Continue current regimen. Continue to monitor. Call with any concerns. Refills given. Labs drawn today.       Relevant Medications   rosuvastatin  (CRESTOR ) 5 MG tablet   amLODipine  (NORVASC ) 10 MG tablet   benazepril  (LOTENSIN ) 40 MG tablet   sildenafil  (REVATIO ) 20 MG tablet   Other Relevant Orders   CBC with Differential/Platelet   Comprehensive metabolic panel with GFR   Lipid Panel w/o Chol/HDL Ratio   Other Visit Diagnoses       Gross hematuria       Chronic. 3+ on urine dip. Will get him into urology- depending on how long they're booking out may need CT renal stone prior to seeing them.   Relevant Orders   Urinalysis, Routine w reflex microscopic   Ambulatory referral to Urology     Tinea pedis of right foot       Will treat with nystatin . Call if not getting better or getting worse and we will punch biopsy.    Relevant Medications   valACYclovir  (VALTREX ) 500 MG tablet   nystatin -triamcinolone  ointment (MYCOLOG)        Follow up plan: Return in about 6 months (around 08/09/2024) for physical.

## 2024-02-08 ENCOUNTER — Ambulatory Visit: Payer: Self-pay | Admitting: Family Medicine

## 2024-02-08 LAB — COMPREHENSIVE METABOLIC PANEL WITH GFR
ALT: 10 IU/L (ref 0–44)
AST: 16 IU/L (ref 0–40)
Albumin: 4.5 g/dL (ref 3.8–4.8)
Alkaline Phosphatase: 71 IU/L (ref 44–121)
BUN/Creatinine Ratio: 12 (ref 10–24)
BUN: 13 mg/dL (ref 8–27)
Bilirubin Total: 0.3 mg/dL (ref 0.0–1.2)
CO2: 18 mmol/L — ABNORMAL LOW (ref 20–29)
Calcium: 9.2 mg/dL (ref 8.6–10.2)
Chloride: 103 mmol/L (ref 96–106)
Creatinine, Ser: 1.11 mg/dL (ref 0.76–1.27)
Globulin, Total: 2.9 g/dL (ref 1.5–4.5)
Glucose: 110 mg/dL — ABNORMAL HIGH (ref 70–99)
Potassium: 4.2 mmol/L (ref 3.5–5.2)
Sodium: 138 mmol/L (ref 134–144)
Total Protein: 7.4 g/dL (ref 6.0–8.5)
eGFR: 69 mL/min/1.73 (ref >59)

## 2024-02-08 LAB — CBC WITH DIFFERENTIAL/PLATELET
Basophils Absolute: 0 x10E3/uL (ref 0.0–0.2)
Basos: 1 %
EOS (ABSOLUTE): 0.3 x10E3/uL (ref 0.0–0.4)
Eos: 3 %
Hematocrit: 43 % (ref 37.5–51.0)
Hemoglobin: 14.1 g/dL (ref 13.0–17.7)
Immature Grans (Abs): 0 x10E3/uL (ref 0.0–0.1)
Immature Granulocytes: 0 %
Lymphocytes Absolute: 1.2 x10E3/uL (ref 0.7–3.1)
Lymphs: 15 %
MCH: 30.9 pg (ref 26.6–33.0)
MCHC: 32.8 g/dL (ref 31.5–35.7)
MCV: 94 fL (ref 79–97)
Monocytes Absolute: 1.1 x10E3/uL — ABNORMAL HIGH (ref 0.1–0.9)
Monocytes: 14 %
Neutrophils Absolute: 5.3 x10E3/uL (ref 1.4–7.0)
Neutrophils: 67 %
Platelets: 265 x10E3/uL (ref 150–450)
RBC: 4.57 x10E6/uL (ref 4.14–5.80)
RDW: 13.8 % (ref 11.6–15.4)
WBC: 7.8 x10E3/uL (ref 3.4–10.8)

## 2024-02-08 LAB — LIPID PANEL W/O CHOL/HDL RATIO
Cholesterol, Total: 158 mg/dL (ref 100–199)
HDL: 52 mg/dL (ref >39)
LDL Chol Calc (NIH): 94 mg/dL (ref 0–99)
Triglycerides: 59 mg/dL (ref 0–149)
VLDL Cholesterol Cal: 12 mg/dL (ref 5–40)

## 2024-02-13 ENCOUNTER — Ambulatory Visit: Admitting: Urology

## 2024-02-29 ENCOUNTER — Other Ambulatory Visit: Payer: Self-pay

## 2024-02-29 DIAGNOSIS — R310 Gross hematuria: Secondary | ICD-10-CM

## 2024-03-02 ENCOUNTER — Ambulatory Visit: Admitting: Urology

## 2024-03-02 VITALS — BP 158/80 | HR 92 | Wt 216.0 lb

## 2024-03-02 DIAGNOSIS — R310 Gross hematuria: Secondary | ICD-10-CM

## 2024-03-02 NOTE — Patient Instructions (Addendum)
 Please call [phone] to schedule your CT scan   Cystoscopy Cystoscopy is a procedure that is used to help diagnose and sometimes treat conditions that affect the lower urinary tract. The lower urinary tract includes the bladder and the urethra. The urethra is the tube that drains urine from the bladder. Cystoscopy is done using a thin, tube-shaped instrument with a light and camera at the end (cystoscope). The cystoscope may be hard or flexible, depending on the goal of the procedure. The cystoscope is inserted through the urethra, into the bladder. Cystoscopy may be recommended if you have: Urinary tract infections that keep coming back. Blood in the urine (hematuria). An inability to control when you urinate (urinary incontinence) or an overactive bladder. Unusual cells found in a urine sample. A blockage in the urethra, such as a urinary stone. Painful urination. An abnormality in the bladder found during an intravenous pyelogram (IVP) or CT scan. What are the risks? Generally, this is a safe procedure. However, problems may occur, including: Infection. Bleeding.  What happens during the procedure?  You will be given one or more of the following: A medicine to numb the area (local anesthetic). The area around the opening of your urethra will be cleaned. The cystoscope will be passed through your urethra into your bladder. Germ-free (sterile) fluid will flow through the cystoscope to fill your bladder. The fluid will stretch your bladder so that your health care provider can clearly examine your bladder walls. Your doctor will look at the urethra and bladder. The cystoscope will be removed The procedure may vary among health care providers  What can I expect after the procedure? After the procedure, it is common to have: Some soreness or pain in your urethra. Urinary symptoms. These include: Mild pain or burning when you urinate. Pain should stop within a few minutes after you  urinate. This may last for up to a few days after the procedure. A small amount of blood in your urine for several days. Feeling like you need to urinate but producing only a small amount of urine. Follow these instructions at home: General instructions Return to your normal activities as told by your health care provider.  Drink plenty of fluids after the procedure. Keep all follow-up visits as told by your health care provider. This is important. Contact a health care provider if you: Have pain that gets worse or does not get better with medicine, especially pain when you urinate lasting longer than 72 hours after the procedure. Have trouble urinating. Get help right away if you: Have blood clots in your urine. Have a fever or chills. Are unable to urinate. Summary Cystoscopy is a procedure that is used to help diagnose and sometimes treat conditions that affect the lower urinary tract. Cystoscopy is done using a thin, tube-shaped instrument with a light and camera at the end. After the procedure, it is common to have some soreness or pain in your urethra. It is normal to have blood in your urine after the procedure.  If you were prescribed an antibiotic medicine, take it as told by your health care provider.  This information is not intended to replace advice given to you by your health care provider. Make sure you discuss any questions you have with your health care provider. Document Revised: 06/27/2018 Document Reviewed: 06/27/2018 Elsevier Patient Education  2020 [employer].

## 2024-03-02 NOTE — Progress Notes (Signed)
   03/02/24 10:42 AM   Nathan Cooper February 16, 1946 [id number]  CC: Gross hematuria, PSA screening  HPI: 78 year old male referred for gross hematuria.  He reports at least a year of intermittent gross hematuria that has been painless.  Most recently has had some small blood clots and urine is always pink or light red.  He denies any flank or abdominal pain.  A CT stone protocol in October 2022 was benign.  He has had 2 urine samples with PCP that have confirmed microscopic hematuria with no evidence of infection.  PSA has been normal, most recently 1.7.  Recent UA with PCP showed 3-10 RBC.   PMH: Past Medical History:  Diagnosis Date   COPD (chronic obstructive pulmonary disease) (HCC)    Coronary artery disease    Hepatitis C    Hernia, inguinal 2014   Hypertension 2009   IFG (impaired fasting glucose) 07/22/2015   Mixed hyperlipidemia 01/06/2023   Personal history of tobacco use, presenting hazards to health 08/26/2015   Sleep apnea     Surgical History: Past Surgical History:  Procedure Laterality Date   COLONOSCOPY  2010   COLONOSCOPY WITH PROPOFOL  N/A 09/21/2017   Procedure: COLONOSCOPY WITH PROPOFOL ;  Surgeon: Janalyn Keene NOVAK, MD;  Location: [facility] ENDOSCOPY;  Service: Endoscopy;  Laterality: N/A;   HERNIA REPAIR  2014     Family History: Family History  Problem Relation Age of Onset   Hypertension Mother    Cirrhosis Father    Gout Son    Heart disease Maternal Uncle 50   Heart attack Maternal Uncle    Cancer Paternal Uncle    Diabetes Paternal Grandmother     Social History:  reports that he has been smoking cigarettes. He has a 47.5 pack-year smoking history. He has never used smokeless tobacco. He reports that he does not currently use drugs. He reports that he does not drink alcohol.  Physical Exam: BP (!) 158/80 (BP Location: Left Arm, Patient Position: Sitting, Cuff Size: Large)   Pulse 92   Wt 216 lb (98 kg)   SpO2 97%   BMI 30.99 kg/m     Constitutional:  Alert and oriented, No acute distress. Cardiovascular: No clubbing, cyanosis, or edema. Respiratory: Normal respiratory effort, no increased work of breathing. GI: Abdomen is soft, nontender, nondistended, no abdominal masses  Assessment & Plan:   78 year old male with at least 1 year of intermittent gross hematuria, worsened over last few months, no evidence of infection on urine samples that confirm microscopic hematuria.  We discussed common possible etiologies of hematuria including BPH, malignancy, urolithiasis, medical renal disease, and idiopathic. Standard workup recommended by the AUA includes imaging with CT urogram to assess the upper tracts, and cystoscopy. Cytology is performed on patient's with gross hematuria to look for malignant cells in the urine.  CT and cystoscopy for completion of hematuria workup   Redell Burnet, MD 03/02/2024  [facility] [address] [place], [place] [address] [phone]

## 2024-03-08 ENCOUNTER — Ambulatory Visit

## 2024-03-08 ENCOUNTER — Ambulatory Visit
Admission: RE | Admit: 2024-03-08 | Discharge: 2024-03-08 | Disposition: A | Discharge status: Home / Self Care | Source: Ambulatory Visit | Attending: Urology | Admitting: Urology

## 2024-03-08 DIAGNOSIS — R310 Gross hematuria: Secondary | ICD-10-CM | POA: Diagnosis present

## 2024-03-08 MED ORDER — SODIUM CHLORIDE 0.9 % IV SOLN: INTRAVENOUS | Status: DC

## 2024-03-08 MED ORDER — IOHEXOL 300 MG/ML  SOLN
125.0000 mL | Freq: Once | INTRAMUSCULAR | Status: AC | PRN
  Administered 2024-03-08: 125 mL via INTRAVENOUS

## 2024-03-12 ENCOUNTER — Ambulatory Visit: Payer: Self-pay | Admitting: Urology

## 2024-03-20 ENCOUNTER — Ambulatory Visit: Admitting: Urology

## 2024-03-20 ENCOUNTER — Other Ambulatory Visit: Payer: Self-pay | Admitting: Urology

## 2024-03-20 VITALS — BP 136/72 | HR 95 | Wt 216.0 lb

## 2024-03-20 DIAGNOSIS — R310 Gross hematuria: Secondary | ICD-10-CM

## 2024-03-20 MED ORDER — CEPHALEXIN 500 MG PO CAPS
500.0000 mg | ORAL_CAPSULE | Freq: Once | ORAL | Status: AC
  Administered 2024-03-20: 500 mg via ORAL

## 2024-03-20 MED ORDER — LIDOCAINE HCL URETHRAL/MUCOSAL 2 % EX GEL
1.0000 | Freq: Once | CUTANEOUS | Status: AC
Start: 2024-03-20 — End: 2024-03-20
  Administered 2024-03-20: 1 via URETHRAL

## 2024-03-20 NOTE — Progress Notes (Signed)
 Cystoscopy Procedure Note:  Indication: Gross hematuria  Keflex  given for prophylaxis  After informed consent and discussion of the procedure and its risks, Nathan Cooper was positioned and prepped in the standard fashion. Cystoscopy was performed with a flexible cystoscope. The urethra, bladder neck and entire bladder was visualized in a standard fashion. The prostate was moderate in size. The ureteral orifices were visualized in their normal location and orientation.  Bladder mucosa grossly normal throughout, no abnormalities on retroflexion, cytology sent.  Imaging: CT benign  Findings: Normal cystoscopy  Assessment and Plan: Suspect BPH as etiology of gross hematuria If cytology benign would recommend starting finasteride daily for recurrent gross hematuria  Redell Burnet, MD 03/20/2024

## 2024-03-28 ENCOUNTER — Ambulatory Visit: Payer: Self-pay | Admitting: Urology

## 2024-03-28 DIAGNOSIS — N401 Enlarged prostate with lower urinary tract symptoms: Secondary | ICD-10-CM

## 2024-03-28 LAB — CYTOLOGY PLUS MONITORING PROFILE: PAP & FEULGEN

## 2024-03-29 MED ORDER — FINASTERIDE 5 MG PO TABS: 5.0000 mg | ORAL_TABLET | Freq: Every day | ORAL | 3 refills | Status: AC

## 2024-03-29 NOTE — Telephone Encounter (Signed)
 Called pt informed him of the information below. Pt voiced understanding. RX sent in. Appt scheduled.

## 2024-03-29 NOTE — Telephone Encounter (Signed)
-----   Message from Nathan Cooper sent at 03/28/2024  2:59 PM EDT ----- Good news, cytology was negative indicating no evidence of bladder cancer as cause of blood in the urine.  I would recommend starting finasteride  5 mg daily to shrink the prostate and help prevent  recurrent episodes of blood in the urine  RTC 6 months PVR ----- Message ----- From: Interface, [facility] Lab Results In Sent: 03/28/2024  12:49 PM EDT To: Nathan JAYSON Burnet, MD

## 2024-08-09 ENCOUNTER — Ambulatory Visit (INDEPENDENT_AMBULATORY_CARE_PROVIDER_SITE_OTHER): Admitting: Family Medicine

## 2024-08-09 ENCOUNTER — Other Ambulatory Visit: Payer: Self-pay | Admitting: Family Medicine

## 2024-08-09 ENCOUNTER — Encounter: Payer: Self-pay | Admitting: Family Medicine

## 2024-08-09 VITALS — BP 128/76 | HR 98 | Temp 97.9°F | Ht 70.0 in | Wt 214.4 lb

## 2024-08-09 DIAGNOSIS — I10 Essential (primary) hypertension: Secondary | ICD-10-CM

## 2024-08-09 DIAGNOSIS — E782 Mixed hyperlipidemia: Secondary | ICD-10-CM

## 2024-08-09 DIAGNOSIS — N529 Male erectile dysfunction, unspecified: Secondary | ICD-10-CM | POA: Diagnosis not present

## 2024-08-09 DIAGNOSIS — R079 Chest pain, unspecified: Secondary | ICD-10-CM | POA: Diagnosis not present

## 2024-08-09 DIAGNOSIS — I25118 Atherosclerotic heart disease of native coronary artery with other forms of angina pectoris: Secondary | ICD-10-CM

## 2024-08-09 MED ORDER — AMLODIPINE BESYLATE 10 MG PO TABS: 10.0000 mg | ORAL_TABLET | Freq: Every day | ORAL | 1 refills | Status: AC

## 2024-08-09 MED ORDER — ASPIRIN 81 MG PO TBEC: 81.0000 mg | DELAYED_RELEASE_TABLET | Freq: Every day | ORAL | 1 refills | Status: AC

## 2024-08-09 MED ORDER — ALBUTEROL SULFATE HFA 108 (90 BASE) MCG/ACT IN AERS: 2.0000 | INHALATION_SPRAY | Freq: Four times a day (QID) | RESPIRATORY_TRACT | 3 refills | Status: AC | PRN

## 2024-08-09 MED ORDER — BENAZEPRIL HCL 40 MG PO TABS: 40.0000 mg | ORAL_TABLET | Freq: Every day | ORAL | 1 refills | Status: AC

## 2024-08-09 MED ORDER — SILDENAFIL CITRATE 20 MG PO TABS: ORAL_TABLET | ORAL | 0 refills | Status: AC

## 2024-08-09 MED ORDER — VALACYCLOVIR HCL 500 MG PO TABS: 500.0000 mg | ORAL_TABLET | Freq: Two times a day (BID) | ORAL | 5 refills | Status: AC | PRN

## 2024-08-09 MED ORDER — ROSUVASTATIN CALCIUM 5 MG PO TABS: 5.0000 mg | ORAL_TABLET | ORAL | 0 refills | Status: AC

## 2024-08-09 NOTE — Progress Notes (Signed)
 "  BP 128/76   Pulse 98   Temp 97.9 F (36.6 C) (Oral)   Ht 5' 10 (1.778 m)   Wt 214 lb 6.4 oz (97.3 kg)   SpO2 96%   BMI 30.76 kg/m    Subjective:    Patient ID: Nathan Cooper, male    DOB: 08-21-1945, 79 y.o.   MRN: [id number]  HPI: Nathan Cooper is a 79 y.o. male  Chief Complaint  Patient presents with   Chest Pain   CHEST PAIN Time since onset: off and on for about a month Onset: sudden Quality: aching, sore to touch at L sternal border, + pressure Severity: mild Location: left para substernal Radiation: none Episode duration: couple of seconds Frequency: couple of times a week Related to exertion: no Activity when pain started: moving to get out of the bed Trauma: no Anxiety/recent stressors: yes Status: worse Treatments attempted: ibuprofen  Current pain status: chest wall tender Shortness of breath: yes Cough: yes Nausea: no Diaphoresis: no Heartburn: no Palpitations: no  HYPERTENSION / HYPERLIPIDEMIA Satisfied with current treatment? no Duration of hypertension: chronic BP monitoring frequency: not checking BP medication side effects: no Past BP meds: benazepril , amlodipine  Duration of hyperlipidemia: chronic Cholesterol medication side effects: no Cholesterol supplements: none Past cholesterol medications: crestor  Medication compliance: excellent compliance Aspirin : yes Recent stressors: no Recurrent headaches: no Visual changes: no Palpitations: no Dyspnea: no Chest pain: no Lower extremity edema: no Dizzy/lightheaded: no   Relevant past medical, surgical, family and social history reviewed and updated as indicated. Interim medical history since our last visit reviewed. Allergies and medications reviewed and updated.  Review of Systems  Constitutional: Negative.   Respiratory: Negative.    Cardiovascular: Negative.   Musculoskeletal: Negative.   Neurological: Negative.   Psychiatric/Behavioral: Negative.      Per HPI  unless specifically indicated above     Objective:    BP 128/76   Pulse 98   Temp 97.9 F (36.6 C) (Oral)   Ht 5' 10 (1.778 m)   Wt 214 lb 6.4 oz (97.3 kg)   SpO2 96%   BMI 30.76 kg/m   Wt Readings from Last 3 Encounters:  08/09/24 214 lb 6.4 oz (97.3 kg)  03/20/24 216 lb (98 kg)  03/02/24 216 lb (98 kg)    Physical Exam Vitals and nursing note reviewed.  Constitutional:      General: He is not in acute distress.    Appearance: Normal appearance. He is obese. He is not ill-appearing, toxic-appearing or diaphoretic.  HENT:     Head: Normocephalic and atraumatic.     Right Ear: External ear normal.     Left Ear: External ear normal.     Nose: Nose normal.     Mouth/Throat:     Mouth: Mucous membranes are moist.     Pharynx: Oropharynx is clear.  Eyes:     General: No scleral icterus.       Right eye: No discharge.        Left eye: No discharge.     Extraocular Movements: Extraocular movements intact.     Conjunctiva/sclera: Conjunctivae normal.     Pupils: Pupils are equal, round, and reactive to light.  Cardiovascular:     Rate and Rhythm: Normal rate and regular rhythm.     Pulses: Normal pulses.     Heart sounds: Normal heart sounds. No murmur heard.    No friction rub. No gallop.  Pulmonary:     Effort: Pulmonary  effort is normal. No respiratory distress.     Breath sounds: Normal breath sounds. No stridor. No wheezing, rhonchi or rales.  Chest:     Chest wall: No tenderness.  Musculoskeletal:        General: Normal range of motion.     Cervical back: Normal range of motion and neck supple.  Skin:    General: Skin is warm and dry.     Capillary Refill: Capillary refill takes less than 2 seconds.     Coloration: Skin is not jaundiced or pale.     Findings: No bruising, erythema, lesion or rash.  Neurological:     General: No focal deficit present.     Mental Status: He is alert and oriented to person, place, and time. Mental status is at baseline.   Psychiatric:        Mood and Affect: Mood normal.        Behavior: Behavior normal.        Thought Content: Thought content normal.        Judgment: Judgment normal.     Results for orders placed or performed in visit on 03/20/24  Cytology Plus Monitoring Profile: Pap & Feulgen   Collection Time: 03/20/24 12:00 AM  Result Value Ref Range   CYTOLOGY PLUS MONITORING PROFILE: PAP & FEULGEN Comment       Assessment & Plan:   Problem List Items Addressed This Visit       Cardiovascular and Mediastinum   HTN (hypertension)   Under good control on current regimen. Continue current regimen. Continue to monitor. Call with any concerns. Refills given. Labs drawn today.        Relevant Medications   amLODipine  (NORVASC ) 10 MG tablet   aspirin  EC (ASPIRIN  LOW DOSE) 81 MG tablet   benazepril  (LOTENSIN ) 40 MG tablet   rosuvastatin  (CRESTOR ) 5 MG tablet   sildenafil  (REVATIO ) 20 MG tablet   Other Relevant Orders   Comprehensive metabolic panel with GFR   CAD (coronary artery disease)   Known CAD from CT going back to 2017. Has been having chest pain off and on. No chest pain right now. + ST changes in V1- he would like to avoid ER. Discussed warning signs to go to ER- including pain returning at all. Will get him into cardiology ASAP. Await their input.       Relevant Medications   amLODipine  (NORVASC ) 10 MG tablet   aspirin  EC (ASPIRIN  LOW DOSE) 81 MG tablet   benazepril  (LOTENSIN ) 40 MG tablet   rosuvastatin  (CRESTOR ) 5 MG tablet   sildenafil  (REVATIO ) 20 MG tablet   Other Relevant Orders   Ambulatory referral to Cardiology   CBC with Differential/Platelet     Other   ED (erectile dysfunction)   Relevant Medications   sildenafil  (REVATIO ) 20 MG tablet   Mixed hyperlipidemia   Under good control on current regimen. Continue current regimen. Continue to monitor. Call with any concerns. Refills given. Labs drawn today.        Relevant Medications   amLODipine  (NORVASC ) 10  MG tablet   aspirin  EC (ASPIRIN  LOW DOSE) 81 MG tablet   benazepril  (LOTENSIN ) 40 MG tablet   rosuvastatin  (CRESTOR ) 5 MG tablet   sildenafil  (REVATIO ) 20 MG tablet   Other Relevant Orders   Comprehensive metabolic panel with GFR   Lipid Panel w/o Chol/HDL Ratio   Other Visit Diagnoses       Chest pain, unspecified type    -  Primary  Patient is not currently in any pain- but it has been coming and going, he does have changes in V1. Known CAD. Get him into cards ASAP- warning signs for ER   Relevant Orders   EKG 12-Lead   Ambulatory referral to Cardiology        Follow up plan: Return in about 3 weeks (around 08/30/2024).      "

## 2024-08-09 NOTE — Assessment & Plan Note (Signed)
 Known CAD from CT going back to 2017. Has been having chest pain off and on. No chest pain right now. + ST changes in V1- he would like to avoid ER. Discussed warning signs to go to ER- including pain returning at all. Will get him into cardiology ASAP. Await their input.

## 2024-08-09 NOTE — Patient Instructions (Addendum)
 10:20 AM Wednesday 08/15/24 [facility] [address], [place], [place] [address] Phone: [phone]

## 2024-08-09 NOTE — Assessment & Plan Note (Signed)
 Under good control on current regimen. Continue current regimen. Continue to monitor. Call with any concerns. Refills given. Labs drawn today.

## 2024-08-09 NOTE — Telephone Encounter (Signed)
 Requested Prescriptions  Pending Prescriptions Disp Refills   ASPIRIN  LOW DOSE 81 MG tablet [Pharmacy Med Name: ASPIRIN  EC 81 MG TABLET] 120 tablet 3    Sig: TAKE 1 TABLET (81 MG TOTAL) BY MOUTH DAILY. SWALLOW WHOLE.     Analgesics:  NSAIDS - aspirin  Passed - 08/09/2024 11:52 AM      Passed - Cr in normal range and within 360 days    Creatinine, Ser  Date Value Ref Range Status  02/07/2024 1.11 0.76 - 1.27 mg/dL Final         Passed - eGFR is 10 or above and within 360 days    GFR calc Af Amer  Date Value Ref Range Status  09/11/2020 82 >59 mL/min/1.73 Final    Comment:    **In accordance with recommendations from the NKF-ASN Task force,**   [facility] is in the process of updating its eGFR calculation to the   2021 CKD-EPI creatinine equation that estimates kidney function   without a race variable.    GFR calc non Af Amer  Date Value Ref Range Status  09/11/2020 71 >59 mL/min/1.73 Final   eGFR  Date Value Ref Range Status  02/07/2024 69 >59 mL/min/1.73 Final         Passed - Patient is not pregnant      Passed - Valid encounter within last 12 months    Recent Outpatient Visits           6 months ago Mixed hyperlipidemia   [facility] Dixon, Duwaine SQUIBB, DO       Future Appointments             In 1 month Francisca, Redell BROCKS, MD [facility] [place]

## 2024-08-10 LAB — CBC WITH DIFFERENTIAL/PLATELET
Basophils Absolute: 0 x10E3/uL (ref 0.0–0.2)
Basos: 0 %
EOS (ABSOLUTE): 0.2 x10E3/uL (ref 0.0–0.4)
Eos: 3 %
Hematocrit: 42.4 % (ref 37.5–51.0)
Hemoglobin: 13.8 g/dL (ref 13.0–17.7)
Immature Grans (Abs): 0 x10E3/uL (ref 0.0–0.1)
Immature Granulocytes: 0 %
Lymphocytes Absolute: 1.7 x10E3/uL (ref 0.7–3.1)
Lymphs: 21 %
MCH: 30.1 pg (ref 26.6–33.0)
MCHC: 32.5 g/dL (ref 31.5–35.7)
MCV: 93 fL (ref 79–97)
Monocytes Absolute: 1 x10E3/uL — ABNORMAL HIGH (ref 0.1–0.9)
Monocytes: 12 %
Neutrophils Absolute: 5.2 x10E3/uL (ref 1.4–7.0)
Neutrophils: 64 %
Platelets: 286 x10E3/uL (ref 150–450)
RBC: 4.58 x10E6/uL (ref 4.14–5.80)
RDW: 14.1 % (ref 11.6–15.4)
WBC: 8.1 x10E3/uL (ref 3.4–10.8)

## 2024-08-10 LAB — LIPID PANEL W/O CHOL/HDL RATIO
Cholesterol, Total: 156 mg/dL (ref 100–199)
HDL: 56 mg/dL
LDL Chol Calc (NIH): 85 mg/dL (ref 0–99)
Triglycerides: 78 mg/dL (ref 0–149)
VLDL Cholesterol Cal: 15 mg/dL (ref 5–40)

## 2024-08-10 LAB — COMPREHENSIVE METABOLIC PANEL WITH GFR
ALT: 11 IU/L (ref 0–44)
AST: 14 IU/L (ref 0–40)
Albumin: 4.5 g/dL (ref 3.8–4.8)
Alkaline Phosphatase: 67 IU/L (ref 47–123)
BUN/Creatinine Ratio: 12 (ref 10–24)
BUN: 12 mg/dL (ref 8–27)
Bilirubin Total: 0.5 mg/dL (ref 0.0–1.2)
CO2: 21 mmol/L (ref 20–29)
Calcium: 8.9 mg/dL (ref 8.6–10.2)
Chloride: 103 mmol/L (ref 96–106)
Creatinine, Ser: 1.02 mg/dL (ref 0.76–1.27)
Globulin, Total: 2.7 g/dL (ref 1.5–4.5)
Glucose: 91 mg/dL (ref 70–99)
Potassium: 3.9 mmol/L (ref 3.5–5.2)
Sodium: 139 mmol/L (ref 134–144)
Total Protein: 7.2 g/dL (ref 6.0–8.5)
eGFR: 76 mL/min/1.73

## 2024-08-11 ENCOUNTER — Ambulatory Visit: Payer: Self-pay | Admitting: Family Medicine

## 2024-08-14 ENCOUNTER — Ambulatory Visit: Admitting: Emergency Medicine

## 2024-08-14 VITALS — Ht 71.0 in | Wt 214.0 lb

## 2024-08-14 DIAGNOSIS — Z0000 Encounter for general adult medical examination without abnormal findings: Secondary | ICD-10-CM | POA: Diagnosis not present

## 2024-08-14 DIAGNOSIS — F17210 Nicotine dependence, cigarettes, uncomplicated: Secondary | ICD-10-CM

## 2024-08-14 DIAGNOSIS — Z1211 Encounter for screening for malignant neoplasm of colon: Secondary | ICD-10-CM

## 2024-08-14 NOTE — Progress Notes (Signed)
 "  Chief Complaint  Patient presents with   Medicare Wellness     Subjective:   Nathan Cooper is a 79 y.o. male who presents for a Medicare Annual Wellness Visit.  Visit info / Clinical Intake: Medicare Wellness Visit Type:: Subsequent Annual Wellness Visit Persons participating in visit and providing information:: patient Medicare Wellness Visit Mode:: Telephone If telephone:: video declined Since this visit was completed virtually, some vitals may be partially provided or unavailable. Missing vitals are due to the limitations of the virtual format.: Documented vitals are patient reported If Telephone or Video please confirm:: I connected with patient using audio/video enable telemedicine. I verified patient identity with two identifiers, discussed telehealth limitations, and patient agreed to proceed. Patient Location:: home Provider Location:: home office Interpreter Needed?: No Pre-visit prep was completed: yes AWV questionnaire completed by patient prior to visit?: no Living arrangements:: with family/others (wife and daughter) Patient's Overall Health Status Rating: good Typical amount of pain: some Does pain affect daily life?: no Are you currently prescribed opioids?: no  Dietary Habits and Nutritional Risks How many meals a day?: (!) 1 Eats fruit and vegetables daily?: (!) no Most meals are obtained by: having others provide food In the last 2 weeks, have you had any of the following?: none Diabetic:: no  Functional Status Activities of Daily Living (to include ambulation/medication): Independent Ambulation: Independent with device- listed below Home Assistive Devices/Equipment: CPAP Medication Administration: Independent Home Management (perform basic housework or laundry): Independent Manage your own finances?: yes Primary transportation is: driving Concerns about vision?: no *vision screening is required for WTM* Concerns about hearing?: no  Fall  Screening Falls in the past year?: 0 Number of falls in past year: 0 Was there an injury with Fall?: 0 Fall Risk Category Calculator: 0 Patient Fall Risk Level: Low Fall Risk  Fall Risk Patient at Risk for Falls Due to: No Fall Risks Fall risk Follow up: Falls evaluation completed  Home and Transportation Safety: All rugs have non-skid backing?: (!) no (most have non-skid backing, but not all) All stairs or steps have railings?: (!) no (4 steps without handrail at back door) Grab bars in the bathtub or shower?: (!) no Have non-skid surface in bathtub or shower?: yes Good home lighting?: yes Regular seat belt use?: yes Hospital stays in the last year:: no  Cognitive Assessment Difficulty concentrating, remembering, or making decisions? : no Will 6CIT or Mini Cog be Completed: yes 6CIT or Mini Cog Declined: patient alert, oriented, able to answer questions appropriately and recall recent events What year is it?: 0 points What month is it?: 0 points Give patient an address phrase to remember (5 components): [address] [place] About what time is it?: 0 points Count backwards from 20 to 1: 0 points Say the months of the year in reverse: 0 points Repeat the address phrase from earlier: 2 points 6 CIT Score: 2 points  Advance Directives (For Healthcare) Does Patient Have a Medical Advance Directive?: Yes Does patient want to make changes to medical advance directive?: No - Patient declined Type of Advance Directive: Healthcare Power of [place]; Living will Copy of Healthcare Power of Attorney in Chart?: No - copy requested Copy of Living Will in Chart?: No - copy requested  Reviewed/Updated  Reviewed/Updated: Reviewed All (Medical, Surgical, Family, Medications, Allergies, Care Teams, Patient Goals)    Allergies (verified) Patient has no known allergies.   Current Medications (verified) Outpatient Encounter Medications as of 08/14/2024  Medication Sig  amLODipine   (NORVASC ) 10 MG tablet Take 1 tablet (10 mg total) by mouth daily.   aspirin  EC (ASPIRIN  LOW DOSE) 81 MG tablet Take 1 tablet (81 mg total) by mouth 5 (five) times daily. SWALLOW WHOLE. (Patient taking differently: Take 81 mg by mouth daily. SWALLOW WHOLE.)   benazepril  (LOTENSIN ) 40 MG tablet Take 1 tablet (40 mg total) by mouth daily.   finasteride  (PROSCAR ) 5 MG tablet Take 1 tablet (5 mg total) by mouth daily.   rosuvastatin  (CRESTOR ) 5 MG tablet Take 1 tablet (5 mg total) by mouth once a week.   sildenafil  (REVATIO ) 20 MG tablet TAKE 1 TO 5 TABLETS BY MOUTH 30 MINUTES PRIOR TO INTERCOURSE   triamcinolone  ointment (KENALOG ) 0.5 % APPLY TO AFFECTED AREA TWICE A DAY (Patient taking differently: APPLY TO AFFECTED AREA TWICE A DAY AS NEEDED)   valACYclovir  (VALTREX ) 500 MG tablet Take 1 tablet (500 mg total) by mouth 2 (two) times daily as needed.   albuterol  (VENTOLIN  HFA) 108 (90 Base) MCG/ACT inhaler Inhale 2 puffs into the lungs every 6 (six) hours as needed for wheezing or shortness of breath. (Patient not taking: Reported on 08/14/2024)   No facility-administered encounter medications on file as of 08/14/2024.    History: Past Medical History:  Diagnosis Date   COPD (chronic obstructive pulmonary disease) (HCC)    Coronary artery disease    Hepatitis C    Hernia, inguinal 2014   Hypertension 2009   IFG (impaired fasting glucose) 07/22/2015   Mixed hyperlipidemia 01/06/2023   Personal history of tobacco use, presenting hazards to health 08/26/2015   Sleep apnea    Past Surgical History:  Procedure Laterality Date   COLONOSCOPY  2010   COLONOSCOPY WITH PROPOFOL  N/A 09/21/2017   Procedure: COLONOSCOPY WITH PROPOFOL ;  Surgeon: Janalyn Keene NOVAK, MD;  Location: [facility] END[facility]COPY;  Service: Endoscopy;  Laterality: N/A;   HERNIA REPAIR  2014   Family History  Problem Relation Age of Onset   Hypertension Mother    Cirrhosis Father    Gout Son    Heart disease Maternal Uncle 50   Heart  attack Maternal Uncle    Cancer Paternal Uncle    Diabetes Paternal Grandmother    Social History   Occupational History   Not on file  Tobacco Use   Smoking status: Every Day    Current packs/day: 1.00    Average packs/day: 1 pack/day for 60.1 years (60.1 ttl pk-yrs)    Types: Cigarettes    Start date: 1966   Smokeless tobacco: Never   Tobacco comments:    has had 5 since jan 2019  Vaping Use   Vaping status: Some Days   Start date: 08/28/2017   Substances: Nicotine   Devices: jewel  Substance and Sexual Activity   Alcohol use: No    Alcohol/week: 0.0 standard drinks of alcohol   Drug use: Not Currently   Sexual activity: Yes   Tobacco Counseling Ready to quit: No Counseling given: No Tobacco comments: has had 5 since jan 2019  SDOH Screenings   Food Insecurity: No Food Insecurity (08/14/2024)  Housing: Low Risk (08/14/2024)  Transportation Needs: No Transportation Needs (08/14/2024)  Utilities: Not At Risk (08/14/2024)  Alcohol Screen: Low Risk (08/09/2024)  Depression (PHQ2-9): Low Risk (08/14/2024)  Financial Resource Strain: Low Risk (08/09/2024)  Physical Activity: Inactive (08/14/2024)  Social Connections: Socially Integrated (08/14/2024)  Stress: No Stress Concern Present (08/14/2024)  Tobacco Use: High Risk (08/14/2024)  Health Literacy: Adequate Health Literacy (08/14/2024)  See flowsheets for full screening details  Depression Screen PHQ 2 & 9 Depression Scale- Over the past 2 weeks, how often have you been bothered by any of the following problems? Little interest or pleasure in doing things: 0 Feeling down, depressed, or hopeless (PHQ Adolescent also includes...irritable): 0 PHQ-2 Total Score: 0 Trouble falling or staying asleep, or sleeping too much: 0 Feeling tired or having little energy: 0 Poor appetite or overeating (PHQ Adolescent also includes...weight loss): 0 Feeling bad about yourself - or that you are a failure or have let yourself or your family  down: 0 Trouble concentrating on things, such as reading the newspaper or watching television (PHQ Adolescent also includes...like school work): 0 Moving or speaking so slowly that other people could have noticed. Or the opposite - being so fidgety or restless that you have been moving around a lot more than usual: 0 Thoughts that you would be better off dead, or of hurting yourself in some way: 0 PHQ-9 Total Score: 0 If you checked off any problems, how difficult have these problems made it for you to do your work, take care of things at home, or get along with other people?: Not difficult at all  Depression Treatment Depression Interventions/Treatment : EYV7-0 Score <4 Follow-up Not Indicated     Goals Addressed               This Visit's Progress     stay healthy (pt-stated)               Objective:    Today's Vitals   08/14/24 1004  Weight: 214 lb (97.1 kg)  Height: 5' 11 (1.803 m)   Body mass index is 29.85 kg/m.  Hearing/Vision screen Hearing Screening - Comments:: Denies hearing loss  Vision Screening - Comments:: Needs routine eye exam. Included list of eye doctors in AVS Immunizations and Health Maintenance Health Maintenance  Topic Date Due   Colonoscopy  09/22/2022   Lung Cancer Screening  04/11/2024   Zoster Vaccines- Shingrix (1 of 2) 10/16/2024 (Originally 11/29/1997)   COVID-19 Vaccine (9 - 2025-26 season) 11/03/2024   DTaP/Tdap/Td (3 - Tdap) 07/16/2025   Medicare Annual Wellness (AWV)  08/14/2025   Pneumococcal Vaccine: 50+ Years  Completed   Influenza Vaccine  Completed   Hepatitis C Screening  Completed   Meningococcal B Vaccine  Aged Out   Hepatitis B Vaccines 19-59 Average Risk  Discontinued        Assessment/Plan:  This is a routine wellness examination for Nathan Cooper.  Patient Care Team: Vicci Duwaine SQUIBB, DO as PCP - General (Family Medicine) Francisca Redell BROCKS, MD as Consulting Physician (Urology)  I have personally reviewed and noted  the following in the patients chart:   Medical and social history Use of alcohol, tobacco or illicit drugs  Current medications and supplements including opioid prescriptions. Functional ability and status Nutritional status Physical activity Advanced directives List of other physicians Hospitalizations, surgeries, and ER visits in previous 12 months Vitals Screenings to include cognitive, depression, and falls Referrals and appointments  Orders Placed This Encounter  Procedures   CT CHEST LUNG CA SCREEN LOW DOSE W/O CM    Standing Status:   Future    Expected Date:   08/15/2024    Expiration Date:   08/14/2025    Preferred Imaging Location?:   [facility]   Ambulatory referral to Gastroenterology    Referral Priority:   Routine    Referral Type:   Consultation  Referral Reason:   Specialty Services Required    Number of Visits Requested:   1   In addition, I have reviewed and discussed with patient certain preventive protocols, quality metrics, and best practice recommendations. A written personalized care plan for preventive services as well as general preventive health recommendations were provided to patient.   Vina Ned, CMA   08/14/2024   Return in 1 year (on 08/15/2025) for Medicare Annual Wellness Visit.  After Visit Summary: (MyChart) Due to this being a telephonic visit, the after visit summary with patients personalized plan was offered to patient via MyChart   Nurse Notes:  6 CIT Score - 2 Placed referral to [facility] for colonoscopy (was due ~09/22/22) Placed order for LDCT for lung cancer screening Needs Shingles vaccine (pharmacy) Needs routine eye exam. Included a list of eye doctors in AVS Reminded patient to call and schedule f/u OV due ~08/30/24 (Patient did not want to schedule until after he sees his cardiologist on 08/15/24) "

## 2024-08-14 NOTE — Patient Instructions (Signed)
 [name],  Thank you for taking the time for your Medicare Wellness Visit. I appreciate your continued commitment to your health goals. Please review the care plan we discussed, and feel free to reach out if I can assist you further.  Please note that Annual Wellness Visits do not include a physical exam. Some assessments may be limited, especially if the visit was conducted virtually. If needed, we may recommend an in-person follow-up with your provider.  Ongoing Care Seeing your primary care provider every 3 to 6 months helps us  monitor your health and provide consistent, personalized care. Don't forget to call schedule a follow up with Dr. Vicci. She wanted to see you again around the middle of Feb.  Referrals If a referral was made during today's visit and you haven't received any updates within two weeks, please contact the referred provider directly to check on the status.         Gastroenterology (GI) for a colonoscopy        [facility]         [address]         [address]         [place], [place] [address]         [phone]           Recommended Screenings:  I have placed an order for a lung cancer screening (low dose CT scan). You may call [phone] to schedule at your earliest convenience.  Recommend getting a routine eye exam every 1-2 years. I have included a list of eye doctors in the area. Please call to schedule.   Health Maintenance  Topic Date Due   Colon Cancer Screening  09/22/2022   Screening for Lung Cancer  04/11/2024   Medicare Annual Wellness Visit  07/07/2024   Zoster (Shingles) Vaccine (1 of 2) 10/16/2024*   COVID-19 Vaccine (9 - 2025-26 season) 11/03/2024   DTaP/Tdap/Td vaccine (3 - Tdap) 07/16/2025   Pneumococcal Vaccine for age over 79  Completed   Flu Shot  Completed   Hepatitis C Screening  Completed   Meningitis B Vaccine  Aged Out   Hepatitis B Vaccine  Discontinued  *Topic was  postponed. The date shown is not the original due date.       08/14/2024   10:10 AM  Advanced Directives  Does Patient Have a Medical Advance Directive? Yes  Type of Estate Agent of [place];Living will  Does patient want to make changes to medical advance directive? No - Patient declined  Copy of Healthcare Power of Attorney in Chart? No - copy requested    Vision: Annual vision screenings are recommended for early detection of glaucoma, cataracts, and diabetic retinopathy. These exams can also reveal signs of chronic conditions such as diabetes and high blood pressure.  Dental: Annual dental screenings help detect early signs of oral cancer, gum disease, and other conditions linked to overall health, including heart disease and diabetes.  Please see the attached documents for additional preventive care recommendations.   There are several Eye Doctors in your area. Here are a few that usually accept all insurance types:  [facility] [address] [place], [place] [address] Phone: [phone]  [facility] [address] [place], [place] [address] Phone: [phone]  [facility] [address] [place], [place] [address] Phone: [phone]  [facility] [address] [place], [place] [address] Phone: [phone]  [facility] [address]  [address] [place], [place] [address] Phone: [phone]  [facility] [address] [place], [place] [address] Phone: [phone]

## 2024-08-15 ENCOUNTER — Ambulatory Visit: Attending: Internal Medicine | Admitting: Internal Medicine

## 2024-08-15 ENCOUNTER — Encounter: Payer: Self-pay | Admitting: Internal Medicine

## 2024-08-15 VITALS — BP 138/68 | HR 90 | Ht 71.0 in | Wt 211.6 lb

## 2024-08-15 DIAGNOSIS — E782 Mixed hyperlipidemia: Secondary | ICD-10-CM | POA: Diagnosis not present

## 2024-08-15 DIAGNOSIS — I700 Atherosclerosis of aorta: Secondary | ICD-10-CM | POA: Diagnosis not present

## 2024-08-15 DIAGNOSIS — R0609 Other forms of dyspnea: Secondary | ICD-10-CM

## 2024-08-15 DIAGNOSIS — I10 Essential (primary) hypertension: Secondary | ICD-10-CM | POA: Diagnosis not present

## 2024-08-15 DIAGNOSIS — R079 Chest pain, unspecified: Secondary | ICD-10-CM | POA: Diagnosis not present

## 2024-08-15 DIAGNOSIS — R072 Precordial pain: Secondary | ICD-10-CM

## 2024-08-15 DIAGNOSIS — R9431 Abnormal electrocardiogram [ECG] [EKG]: Secondary | ICD-10-CM | POA: Diagnosis not present

## 2024-08-15 MED ORDER — METOPROLOL TARTRATE 100 MG PO TABS: 100.0000 mg | ORAL_TABLET | Freq: Once | ORAL | 0 refills | Status: AC

## 2024-08-15 NOTE — Progress Notes (Unsigned)
" °  Cardiology Office Note:  .   Date:  08/15/2024  ID:  Nathan Cooper, DOB Aug 06, 1945, MRN [id number] PCP: Vicci Duwaine SQUIBB, DO  [facility] Providers Cardiologist:  New { Click to update primary MD,subspecialty MD or APP then REFRESH:1}    History of Present Illness: .   Nathan Cooper is a 79 y.o. male with history of hypertension, hyperlipidemia, coronary artery calcification, hepatitis C, COPD, arthritis, and sleep apnea on CPAP, who has been referred by Dr. Vicci for evaluation of chest pain.  He was seen in our practice once in 2019 by Dr. Gollan for evaluation of dyspnea on exertion and PVCs.  No further testing or intervention was pursued at that time.  Strange feeling in left side of chest, mostly when he wakes up in the morning and raises up.  Little tightness.  Lasts a few seconds.  Began about a month ago.  No exertional chest pain.  Stable DOE for 3-4 years.  No palpitations.  Had some dizziness with cold 2-3 months ago; was Rx'ed albuterol  due to coughing spells.  No LE edema.  No prior heart issues.  ROS: See HPI  Studies Reviewed: SABRA   EKG Interpretation Date/Time:  Wednesday August 15 2024 10:38:40 EST Ventricular Rate:  90 PR Interval:  208 QRS Duration:  82 QT Interval:  308 QTC Calculation: 376 R Axis:   71  Text Interpretation: Normal sinus rhythm with sinus arrhythmia Possible Left atrial enlargement T wave abnormality, consider inferolateral ischemia Abnormal ECG No previous ECGs available Confirmed by Elivia Robotham, Lonni [phone]) on 08/15/2024 10:50:17 AM    *** Risk Assessment/Calculations:   {Does this patient have ATRIAL FIBRILLATION?:[phone]}         Physical Exam:   VS:  BP 138/68 (BP Location: Left Arm, Patient Position: Sitting, Cuff Size: Normal)   Pulse 90   Ht 5' 11 (1.803 m)   Wt 211 lb 9.6 oz (96 kg)   SpO2 96%   BMI 29.51 kg/m    Wt Readings from Last 3 Encounters:  08/15/24 211 lb 9.6 oz (96 kg)  08/14/24 214 lb (97.1  kg)  08/09/24 214 lb 6.4 oz (97.3 kg)    General:  NAD. Neck: No JVD or HJR. Lungs: Clear to auscultation bilaterally without wheezes or crackles. Heart: Regular rate and rhythm without murmurs, rubs, or gallops. Abdomen: Soft, nontender, nondistended. Extremities: No lower extremity edema.  ASSESSMENT AND PLAN: .    ***    {Are you ordering a CV Procedure (e.g. stress test, cath, DCCV, TEE, etc)?   Press F2        :[id number]}  Dispo: ***  Signed, Lonni Hanson, MD  "

## 2024-08-15 NOTE — Patient Instructions (Signed)
 Medication Instructions:  Your physician recommends that you continue on your current medications as directed. Please refer to the Current Medication list given to you today.    *If you need a refill on your cardiac medications before your next appointment, please call your pharmacy*  Lab Work: Your provider would like for you to have following labs drawn today BMP.     Testing/Procedures: Your physician has requested that you have an echocardiogram. Echocardiography is a painless test that uses sound waves to create images of your heart. It provides your doctor with information about the size and shape of your heart and how well your hearts chambers and valves are working.   You may receive an ultrasound enhancing agent through an IV if needed to better visualize your heart during the echo. This procedure takes approximately one hour.  There are no restrictions for this procedure.  This will take place at [address] [facility] [address] [facility], [place] [address]  Please note: We ask at that you not bring children with you during ultrasound (echo/ vascular) testing. Due to room size and safety concerns, children are not allowed in the ultrasound rooms during exams. Our front office staff cannot provide observation of children in our lobby area while testing is being conducted. An adult accompanying a patient to their appointment will only be allowed in the ultrasound room at the discretion of the ultrasound technician under special circumstances. We apologize for any inconvenience.     Your cardiac CT will be scheduled at one of the below locations:   [facility] [address] [place], [place] [address] [phone]  If scheduled at [facility] or [facility], please arrive 15 mins early for check-in and test prep.  There is spacious parking and easy access to the radiology department from the [facility] Heart  and Vascular entrance. Please enter here and check-in with the desk attendant.   Please follow these instructions carefully (unless otherwise directed):  An IV will be required for this test and Nitroglycerin will be given.  Hold all erectile dysfunction medications at least 3 days (72 hrs) prior to test. (Ie viagra , cialis, sildenafil , tadalafil, etc)   On the Night Before the Test: Be sure to Drink plenty of water. Do not consume any caffeinated/decaffeinated beverages or chocolate 12 hours prior to your test. Do not take any antihistamines 12 hours prior to your test.   On the Day of the Test: Drink plenty of water until 1 hour prior to the test. Do not eat any food 1 hour prior to test. You may take your regular medications prior to the test.  Take metoprolol  (Lopressor ) 100 mg two hours prior to test. If you take Furosemide/Hydrochlorothiazide/Spironolactone/Chlorthalidone, please HOLD on the morning of the test. Patients who wear a continuous glucose monitor MUST remove the device prior to scanning. FEMALES- please wear underwire-free bra if available, avoid dresses & tight clothing      After the Test: Drink plenty of water. After receiving IV contrast, you may experience a mild flushed feeling. This is normal. On occasion, you may experience a mild rash up to 24 hours after the test. This is not dangerous. If this occurs, you can take Benadryl 25 mg, Zyrtec, Claritin, or Allegra and increase your fluid intake. (Patients taking Tikosyn should avoid Benadryl, and may take Zyrtec, Claritin, or Allegra) If you experience trouble breathing, this can be serious. If it is severe call 911 IMMEDIATELY. If  it is mild, please call our office.  We will call to schedule your test 2-4 weeks out understanding that some insurance companies will need an authorization prior to the service being performed.   For more information and frequently asked questions, please visit our website :  [email or web address]  For non-scheduling related questions, please contact the cardiac imaging nurse navigator should you have any questions/concerns: Cardiac Imaging Nurse Navigators Direct Office Dial: [phone]   For scheduling needs, including cancellations and rescheduling, please call Brittany, [phone].    Follow-Up: At [facility], you and your health needs are our priority.  As part of our continuing mission to provide you with exceptional heart care, our providers are all part of one team.  This team includes your primary Cardiologist (physician) and Advanced Practice Providers or APPs (Physician Assistants and Nurse Practitioners) who all work together to provide you with the care you need, when you need it.  Your next appointment:   4 -6 week(s)  Provider:   You may see Lonni Hanson, MD or one of the following Advanced Practice Providers on your designated Care Team:   Lonni Meager, NP Lesley Maffucci, PA-C Bernardino Bring, PA-C Cadence La Vale, PA-C Tylene Lunch, NP Barnie Hila, NP

## 2024-08-16 ENCOUNTER — Encounter: Payer: Self-pay | Admitting: Emergency Medicine

## 2024-08-16 ENCOUNTER — Ambulatory Visit: Payer: Self-pay | Admitting: Internal Medicine

## 2024-08-16 ENCOUNTER — Encounter: Payer: Self-pay | Admitting: Internal Medicine

## 2024-08-16 DIAGNOSIS — R0609 Other forms of dyspnea: Secondary | ICD-10-CM | POA: Insufficient documentation

## 2024-08-16 DIAGNOSIS — R072 Precordial pain: Secondary | ICD-10-CM | POA: Insufficient documentation

## 2024-08-16 LAB — BASIC METABOLIC PANEL WITH GFR
BUN/Creatinine Ratio: 17 (ref 10–24)
BUN: 18 mg/dL (ref 8–27)
CO2: 22 mmol/L (ref 20–29)
Calcium: 9.6 mg/dL (ref 8.6–10.2)
Chloride: 102 mmol/L (ref 96–106)
Creatinine, Ser: 1.09 mg/dL (ref 0.76–1.27)
Glucose: 87 mg/dL (ref 70–99)
Potassium: 4.6 mmol/L (ref 3.5–5.2)
Sodium: 138 mmol/L (ref 134–144)
eGFR: 70 mL/min/{1.73_m2}

## 2024-08-27 ENCOUNTER — Ambulatory Visit

## 2024-09-27 ENCOUNTER — Ambulatory Visit: Admitting: Urology

## 2024-10-24 ENCOUNTER — Ambulatory Visit: Admitting: Internal Medicine

## 2025-08-15 ENCOUNTER — Ambulatory Visit
